# Patient Record
Sex: Female | Born: 1945
Health system: Southern US, Community
[De-identification: ages and names within clinical notes are randomized; demographics above are authoritative.]

## PROBLEM LIST (undated history)

## (undated) DIAGNOSIS — I10 Essential (primary) hypertension: Secondary | ICD-10-CM

## (undated) DIAGNOSIS — M1990 Unspecified osteoarthritis, unspecified site: Secondary | ICD-10-CM

## (undated) DIAGNOSIS — J309 Allergic rhinitis, unspecified: Secondary | ICD-10-CM

## (undated) DIAGNOSIS — K219 Gastro-esophageal reflux disease without esophagitis: Secondary | ICD-10-CM

## (undated) DIAGNOSIS — E785 Hyperlipidemia, unspecified: Secondary | ICD-10-CM

## (undated) DIAGNOSIS — T7840XA Allergy, unspecified, initial encounter: Secondary | ICD-10-CM

## (undated) HISTORY — PX: COLONOSCOPY: SHX174

## (undated) HISTORY — DX: Essential (primary) hypertension: I10

## (undated) HISTORY — DX: Allergic rhinitis, unspecified: J30.9

## (undated) HISTORY — PX: ABDOMINAL HYSTERECTOMY: SHX81

## (undated) HISTORY — DX: Hyperlipidemia, unspecified: E78.5

## (undated) HISTORY — DX: Allergy, unspecified, initial encounter: T78.40XA

## (undated) HISTORY — PX: CATARACT EXTRACTION W/ INTRAOCULAR LENS IMPLANT: SHX1309

## (undated) HISTORY — PX: FOOT SURGERY: SHX648

---

## 1988-08-03 HISTORY — PX: VAGINAL HYSTERECTOMY: SUR661

## 2006-01-15 DIAGNOSIS — E881 Lipodystrophy, not elsewhere classified: Secondary | ICD-10-CM | POA: Insufficient documentation

## 2006-02-11 ENCOUNTER — Ambulatory Visit: Payer: Self-pay

## 2006-06-11 DIAGNOSIS — M545 Low back pain, unspecified: Secondary | ICD-10-CM | POA: Insufficient documentation

## 2007-01-24 ENCOUNTER — Ambulatory Visit: Payer: Self-pay | Admitting: Family Medicine

## 2007-03-24 ENCOUNTER — Ambulatory Visit: Payer: Self-pay

## 2007-07-20 ENCOUNTER — Ambulatory Visit: Payer: Self-pay | Admitting: Specialist

## 2007-12-20 DIAGNOSIS — L989 Disorder of the skin and subcutaneous tissue, unspecified: Secondary | ICD-10-CM | POA: Insufficient documentation

## 2008-05-03 ENCOUNTER — Ambulatory Visit: Payer: Self-pay | Admitting: Unknown Physician Specialty

## 2008-10-03 ENCOUNTER — Ambulatory Visit: Payer: Self-pay | Admitting: Gastroenterology

## 2009-08-20 ENCOUNTER — Ambulatory Visit: Payer: Self-pay

## 2010-11-26 ENCOUNTER — Ambulatory Visit: Payer: Self-pay

## 2011-11-15 DIAGNOSIS — R51 Headache: Secondary | ICD-10-CM | POA: Diagnosis not present

## 2011-11-15 DIAGNOSIS — H9209 Otalgia, unspecified ear: Secondary | ICD-10-CM | POA: Diagnosis not present

## 2011-11-15 DIAGNOSIS — J3489 Other specified disorders of nose and nasal sinuses: Secondary | ICD-10-CM | POA: Diagnosis not present

## 2011-11-28 DIAGNOSIS — R109 Unspecified abdominal pain: Secondary | ICD-10-CM | POA: Diagnosis not present

## 2011-12-03 DIAGNOSIS — J069 Acute upper respiratory infection, unspecified: Secondary | ICD-10-CM | POA: Diagnosis not present

## 2011-12-03 DIAGNOSIS — E785 Hyperlipidemia, unspecified: Secondary | ICD-10-CM | POA: Diagnosis not present

## 2012-01-23 DIAGNOSIS — J029 Acute pharyngitis, unspecified: Secondary | ICD-10-CM | POA: Diagnosis not present

## 2012-01-23 DIAGNOSIS — J019 Acute sinusitis, unspecified: Secondary | ICD-10-CM | POA: Diagnosis not present

## 2012-01-23 DIAGNOSIS — R05 Cough: Secondary | ICD-10-CM | POA: Diagnosis not present

## 2012-01-23 DIAGNOSIS — R030 Elevated blood-pressure reading, without diagnosis of hypertension: Secondary | ICD-10-CM | POA: Diagnosis not present

## 2012-01-23 DIAGNOSIS — R059 Cough, unspecified: Secondary | ICD-10-CM | POA: Diagnosis not present

## 2012-02-29 DIAGNOSIS — I8390 Asymptomatic varicose veins of unspecified lower extremity: Secondary | ICD-10-CM | POA: Diagnosis not present

## 2012-02-29 DIAGNOSIS — E881 Lipodystrophy, not elsewhere classified: Secondary | ICD-10-CM | POA: Diagnosis not present

## 2012-02-29 DIAGNOSIS — I10 Essential (primary) hypertension: Secondary | ICD-10-CM | POA: Diagnosis not present

## 2012-02-29 DIAGNOSIS — E785 Hyperlipidemia, unspecified: Secondary | ICD-10-CM | POA: Diagnosis not present

## 2012-02-29 DIAGNOSIS — R490 Dysphonia: Secondary | ICD-10-CM | POA: Diagnosis not present

## 2012-03-23 DIAGNOSIS — Z01419 Encounter for gynecological examination (general) (routine) without abnormal findings: Secondary | ICD-10-CM | POA: Diagnosis not present

## 2012-03-23 DIAGNOSIS — Z1231 Encounter for screening mammogram for malignant neoplasm of breast: Secondary | ICD-10-CM | POA: Diagnosis not present

## 2012-03-23 DIAGNOSIS — Z124 Encounter for screening for malignant neoplasm of cervix: Secondary | ICD-10-CM | POA: Diagnosis not present

## 2012-03-23 DIAGNOSIS — R87810 Cervical high risk human papillomavirus (HPV) DNA test positive: Secondary | ICD-10-CM | POA: Diagnosis not present

## 2012-09-16 DIAGNOSIS — H6690 Otitis media, unspecified, unspecified ear: Secondary | ICD-10-CM | POA: Diagnosis not present

## 2012-09-16 DIAGNOSIS — H6980 Other specified disorders of Eustachian tube, unspecified ear: Secondary | ICD-10-CM | POA: Diagnosis not present

## 2013-06-19 DIAGNOSIS — R490 Dysphonia: Secondary | ICD-10-CM | POA: Diagnosis not present

## 2013-06-19 DIAGNOSIS — I10 Essential (primary) hypertension: Secondary | ICD-10-CM | POA: Diagnosis not present

## 2013-06-19 DIAGNOSIS — H65199 Other acute nonsuppurative otitis media, unspecified ear: Secondary | ICD-10-CM | POA: Diagnosis not present

## 2013-06-19 DIAGNOSIS — I8390 Asymptomatic varicose veins of unspecified lower extremity: Secondary | ICD-10-CM | POA: Diagnosis not present

## 2013-11-03 DIAGNOSIS — I10 Essential (primary) hypertension: Secondary | ICD-10-CM | POA: Diagnosis not present

## 2013-11-03 DIAGNOSIS — Z23 Encounter for immunization: Secondary | ICD-10-CM | POA: Diagnosis not present

## 2013-11-03 DIAGNOSIS — I8390 Asymptomatic varicose veins of unspecified lower extremity: Secondary | ICD-10-CM | POA: Diagnosis not present

## 2013-11-03 DIAGNOSIS — R490 Dysphonia: Secondary | ICD-10-CM | POA: Diagnosis not present

## 2013-11-03 DIAGNOSIS — E881 Lipodystrophy, not elsewhere classified: Secondary | ICD-10-CM | POA: Diagnosis not present

## 2013-11-03 DIAGNOSIS — E782 Mixed hyperlipidemia: Secondary | ICD-10-CM | POA: Diagnosis not present

## 2013-11-20 DIAGNOSIS — J019 Acute sinusitis, unspecified: Secondary | ICD-10-CM | POA: Diagnosis not present

## 2013-12-28 ENCOUNTER — Ambulatory Visit: Payer: Self-pay | Admitting: Family Medicine

## 2013-12-28 DIAGNOSIS — Z1231 Encounter for screening mammogram for malignant neoplasm of breast: Secondary | ICD-10-CM | POA: Diagnosis not present

## 2013-12-28 LAB — HM MAMMOGRAPHY

## 2014-07-30 DIAGNOSIS — H6690 Otitis media, unspecified, unspecified ear: Secondary | ICD-10-CM | POA: Diagnosis not present

## 2014-09-08 IMAGING — MG MM DIGITAL SCREENING BILAT W/ CAD
1 series · 4 of 4 positions shown · non-contrast
Comparison: Previous exam(s).

CLINICAL DATA: Screening.

EXAM:
DIGITAL SCREENING BILATERAL MAMMOGRAM WITH CAD

[R CC · right · 4 of 4 slices shown]
[im 1/4]
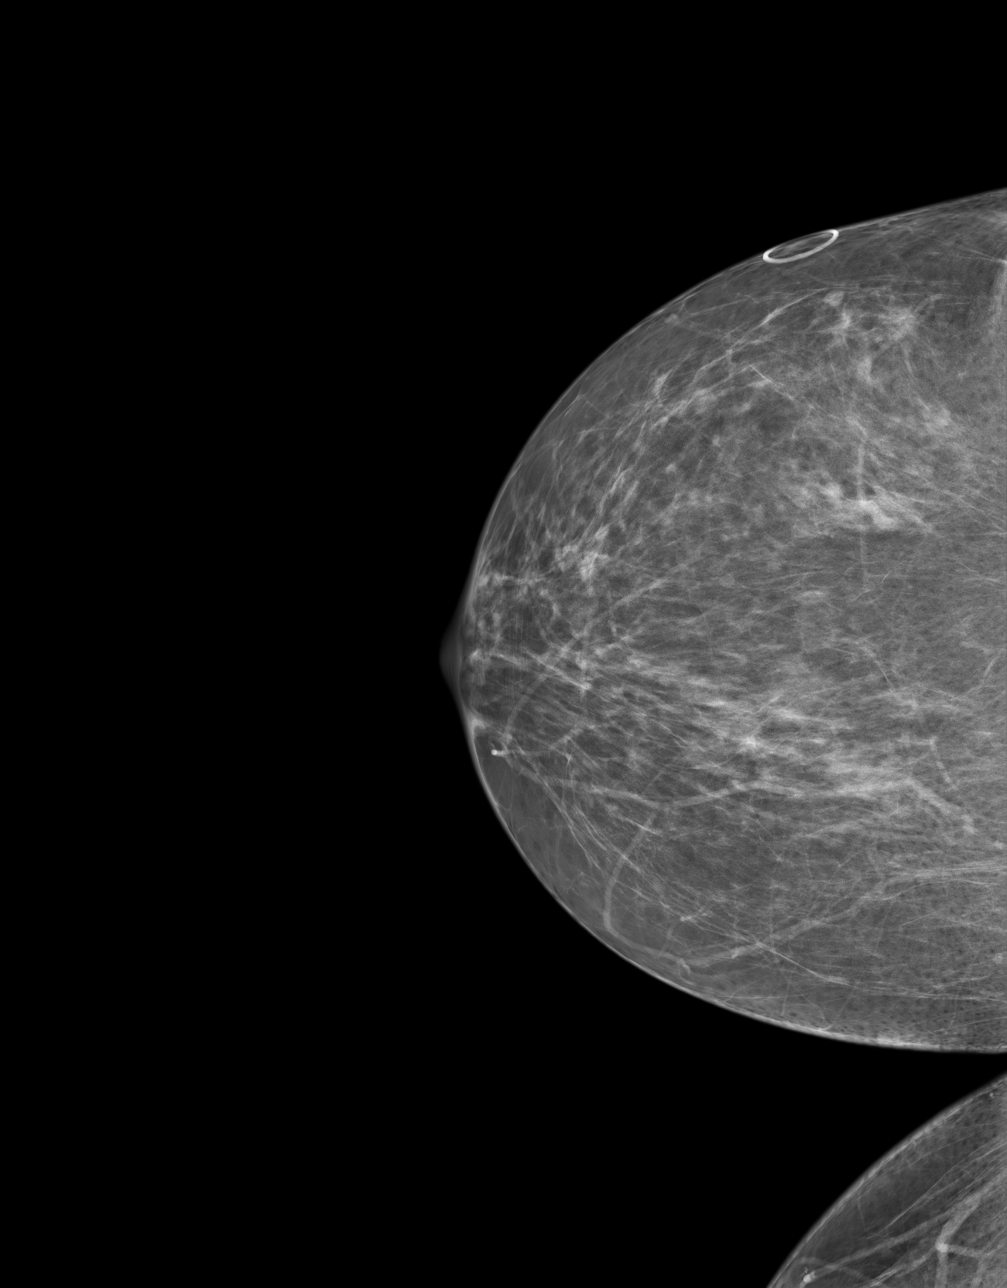
[im 2/4]
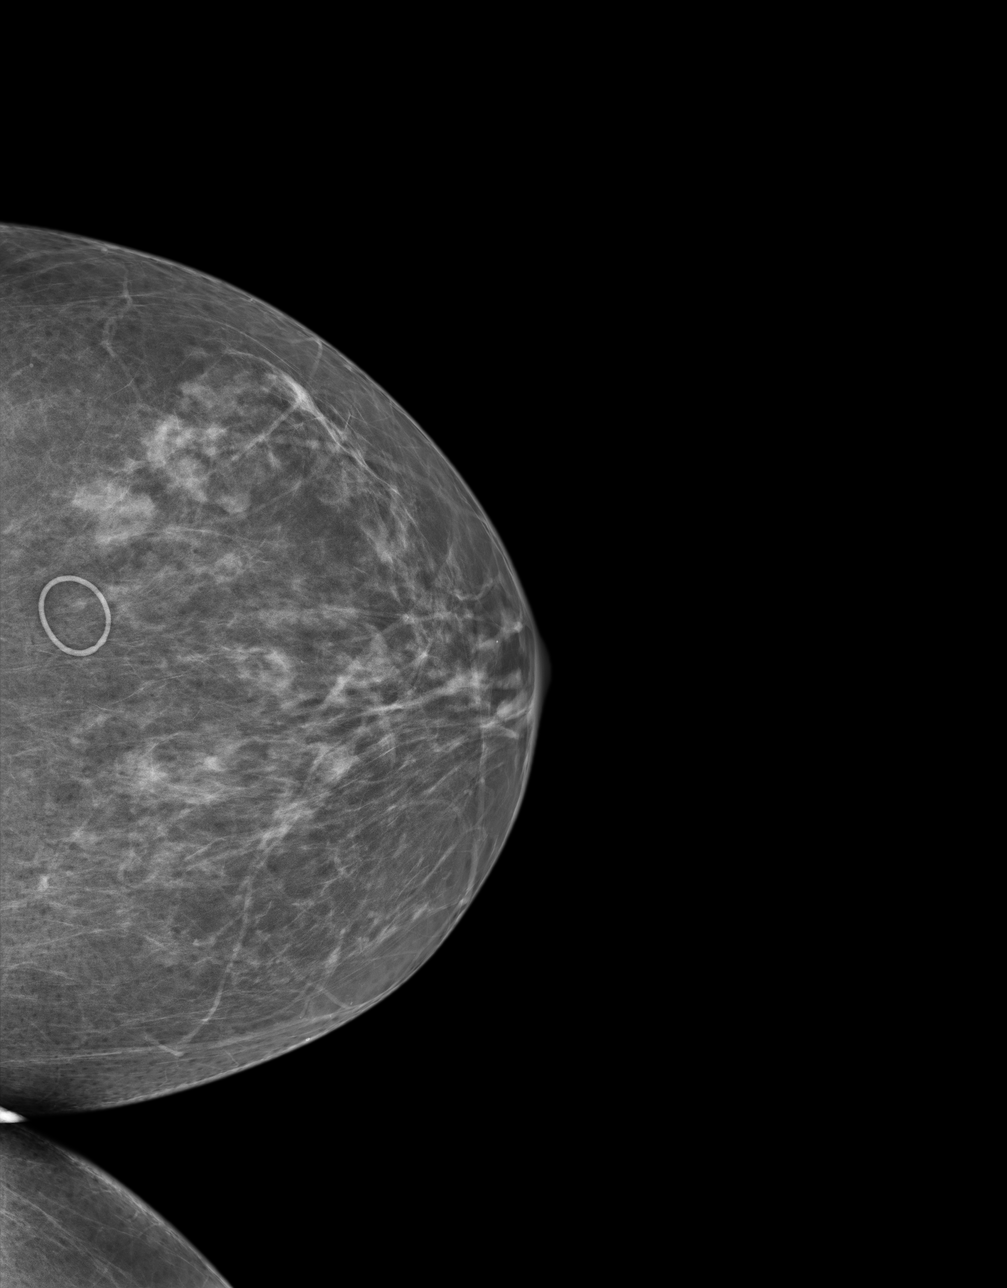
[im 3/4]
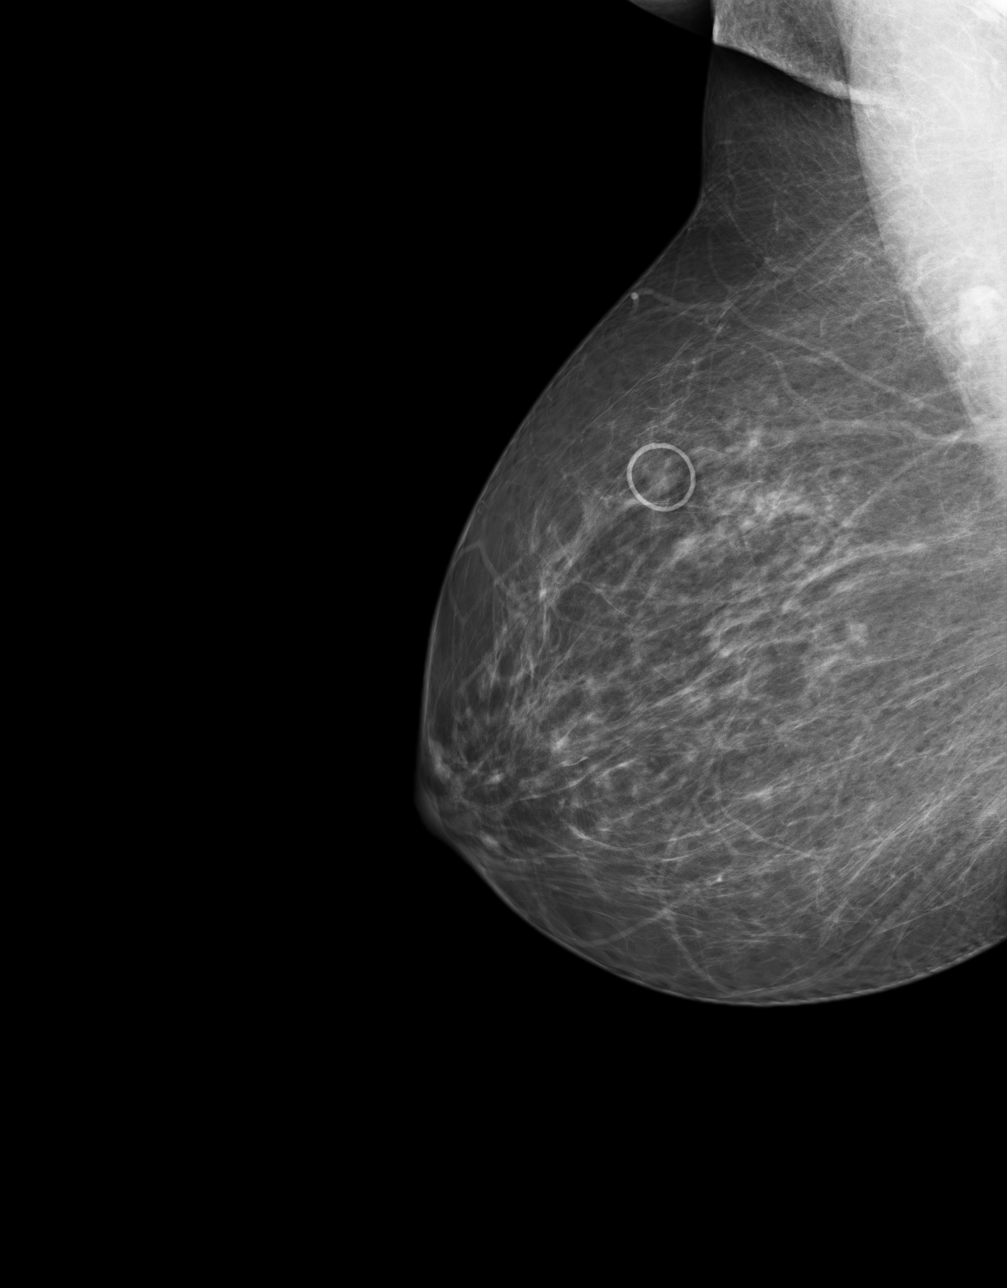
[im 4/4]
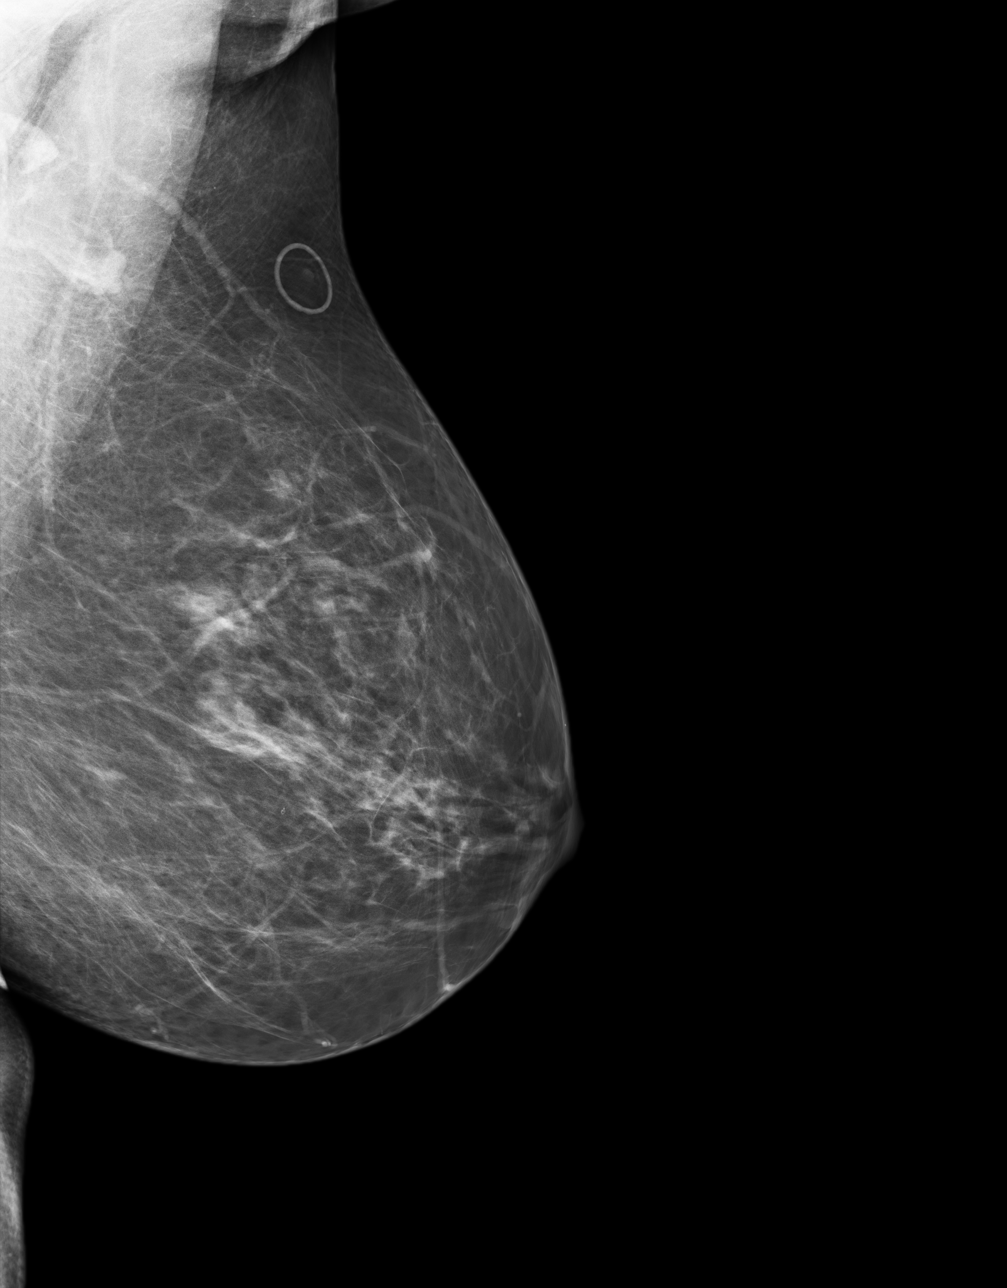

[4 of 4 positions shown; findings below may reference images not displayed]

ACR Breast Density Category b: There are scattered areas of
fibroglandular density.
FINDINGS: There are no findings suspicious for malignancy. Images were
processed with CAD.
IMPRESSION: No mammographic evidence of malignancy. A result letter of this
screening mammogram will be mailed directly to the patient.

RECOMMENDATION:
Screening mammogram in one year. (Code:AS-G-LCT)

BI-RADS CATEGORY  1: Negative.

## 2014-09-23 DIAGNOSIS — J189 Pneumonia, unspecified organism: Secondary | ICD-10-CM | POA: Diagnosis not present

## 2014-09-23 DIAGNOSIS — R05 Cough: Secondary | ICD-10-CM | POA: Diagnosis not present

## 2014-12-25 DIAGNOSIS — Z0000 Encounter for general adult medical examination without abnormal findings: Secondary | ICD-10-CM | POA: Diagnosis not present

## 2014-12-25 DIAGNOSIS — E881 Lipodystrophy, not elsewhere classified: Secondary | ICD-10-CM | POA: Diagnosis not present

## 2014-12-25 DIAGNOSIS — I10 Essential (primary) hypertension: Secondary | ICD-10-CM | POA: Diagnosis not present

## 2014-12-25 LAB — BASIC METABOLIC PANEL
BUN: 14 mg/dL (ref 4–21)
CREATININE: 0.8 mg/dL (ref 0.5–1.1)
Glucose: 91 mg/dL
Potassium: 4.4 mmol/L (ref 3.4–5.3)
Sodium: 143 mmol/L (ref 137–147)

## 2014-12-25 LAB — LIPID PANEL
Cholesterol: 142 mg/dL (ref 0–200)
HDL: 38 mg/dL (ref 35–70)
LDL Cholesterol: 77 mg/dL
Triglycerides: 136 mg/dL (ref 40–160)

## 2015-03-30 ENCOUNTER — Other Ambulatory Visit: Payer: Self-pay | Admitting: Family Medicine

## 2015-04-02 ENCOUNTER — Ambulatory Visit (INDEPENDENT_AMBULATORY_CARE_PROVIDER_SITE_OTHER): Payer: Medicare Other | Admitting: Family Medicine

## 2015-04-02 ENCOUNTER — Encounter: Payer: Self-pay | Admitting: Family Medicine

## 2015-04-02 VITALS — BP 132/82 | HR 67 | Temp 98.3°F | Resp 18

## 2015-04-02 DIAGNOSIS — R251 Tremor, unspecified: Secondary | ICD-10-CM | POA: Insufficient documentation

## 2015-04-02 DIAGNOSIS — I10 Essential (primary) hypertension: Secondary | ICD-10-CM | POA: Insufficient documentation

## 2015-04-02 DIAGNOSIS — J329 Chronic sinusitis, unspecified: Secondary | ICD-10-CM | POA: Diagnosis not present

## 2015-04-02 DIAGNOSIS — J309 Allergic rhinitis, unspecified: Secondary | ICD-10-CM | POA: Insufficient documentation

## 2015-04-02 DIAGNOSIS — K146 Glossodynia: Secondary | ICD-10-CM | POA: Insufficient documentation

## 2015-04-02 DIAGNOSIS — I8390 Asymptomatic varicose veins of unspecified lower extremity: Secondary | ICD-10-CM | POA: Insufficient documentation

## 2015-04-02 DIAGNOSIS — J069 Acute upper respiratory infection, unspecified: Secondary | ICD-10-CM | POA: Insufficient documentation

## 2015-04-02 MED ORDER — AMOXICILLIN 500 MG PO CAPS: 1000.0000 mg | ORAL_CAPSULE | Freq: Two times a day (BID) | ORAL | Status: AC

## 2015-04-02 NOTE — Progress Notes (Signed)
       Patient: Deanna Weaver Female    DOB: 1946/06/21   69 y.o.   MRN: [id number] Visit Date: 04/02/2015  Today's Provider: Lelon Huh, MD   Chief Complaint  Patient presents with  . Ear Pain    left ear x 2 days   Subjective:    HPI Patient presents to day with left ear pain. Patient states pain started 2 days ago and is worsening. Pain is described as a sharp shooting pain. Patient has been using Aleve and a prescription ear drop that was prescribed to her over 1 year ago for ear infection. Patient states the ear drops have helped decrease the severity of pain.  Patient states she has been having hoarseness, sinus congestion and pain,  for the past 2 weeks.  Patient denies any fevers, chills, sweats, headaches, lightheadedness or dizziness.     Allergies  Allergen Reactions  . Celecoxib   . Metoprolol     Depression, palpitations.   Previous Medications   FLUTICASONE (FLONASE) 50 MCG/ACT NASAL SPRAY    Place into the nose. 2(two) nasal each nostril once a day   LISINOPRIL (PRINIVIL,ZESTRIL) 5 MG TABLET    TAKE 1 TABLET BY MOUTH EVERY DAY   OMEGA-3 FATTY ACIDS (FISH OIL) 1000 MG CAPS    Take 4 capsules by mouth daily.   SIMVASTATIN (ZOCOR) 40 MG TABLET    TAKE 1 TABLET BY MOUTH AT BEDTIME    Review of Systems  Constitutional: Negative for fever, chills, appetite change and fatigue.  HENT: Positive for congestion (sinus), ear discharge, ear pain (left ear), rhinorrhea, sinus pressure, sneezing, sore throat and voice change (hoarsness). Negative for nosebleeds, postnasal drip, tinnitus and trouble swallowing.   Eyes: Negative for photophobia, pain, discharge, redness, itching and visual disturbance.  Respiratory: Negative for cough, chest tightness and shortness of breath.   Cardiovascular: Negative for chest pain and palpitations.  Gastrointestinal: Negative for nausea, vomiting and abdominal pain.  Neurological: Negative for dizziness and weakness.    Social History    Substance Use Topics  . Smoking status: Never Smoker   . Smokeless tobacco: Not on file  . Alcohol Use: No   Objective:   BP 132/82 mmHg  Pulse 67  Temp(Src) 98.3 F (36.8 C) (Oral)  Resp 18  SpO2 97%  Physical Exam  General Appearance:    Alert, cooperative, no distress  HENT:   left TM fluid noted, neck without nodes, throat normal without erythema or exudate, frontal sinus tender and post nasal drip noted  Eyes:    PERRL, conjunctiva/corneas clear, EOM's intact       Lungs:     Clear to auscultation bilaterally, respirations unlabored  Heart:    Regular rate and rhythm  Neurologic:   Awake, alert, oriented x 3. No apparent focal neurological           defect.           Assessment & Plan:     1. Sinusitis, unspecified chronicity, unspecified location May take two OTC every 4 hours for ear pain. Call if not much better in 1-2 days.  - amoxicillin (AMOXIL) 500 MG capsule; Take 2 capsules (1,000 mg total) by mouth 2 (two) times daily.  Dispense: 40 capsule; Refill: 0        Lelon Huh, MD  [facility]

## 2015-04-16 ENCOUNTER — Encounter: Payer: Self-pay | Admitting: Family Medicine

## 2015-06-14 ENCOUNTER — Ambulatory Visit (INDEPENDENT_AMBULATORY_CARE_PROVIDER_SITE_OTHER): Payer: Medicare Other | Admitting: Family Medicine

## 2015-06-14 ENCOUNTER — Encounter: Payer: Self-pay | Admitting: Family Medicine

## 2015-06-14 VITALS — BP 130/80 | HR 72 | Temp 98.1°F | Resp 16 | Wt 150.0 lb

## 2015-06-14 DIAGNOSIS — H6692 Otitis media, unspecified, left ear: Secondary | ICD-10-CM

## 2015-06-14 DIAGNOSIS — G250 Essential tremor: Secondary | ICD-10-CM

## 2015-06-14 MED ORDER — PRIMIDONE 50 MG PO TABS: ORAL_TABLET | ORAL | Status: DC

## 2015-06-14 MED ORDER — AMOXICILLIN 500 MG PO CAPS: 1000.0000 mg | ORAL_CAPSULE | Freq: Two times a day (BID) | ORAL | Status: AC

## 2015-06-14 NOTE — Progress Notes (Signed)
Patient: Deanna Weaver Female    DOB: 07/13/1946   69 y.o.   MRN: [id number] Visit Date: 06/14/2015  Today's Provider: Lelon Huh, MD   Chief Complaint  Patient presents with  . Ear Pain    x 2 days   Subjective:    HPI  Ear Pain: Patient has had pain in her left ear for 2 days. Patient describes the pain as a sharp sensation inside her ear.  Patient has tried using ear drops and Tylenol to help with pain.  Patient also has tenderness in the glands below her left ear.   Tremors: Has had mild but worsening tremors in both hands for several yearly. Tremors only occur when using hands. No resting tremors. No gait or balance problems. Tried metoprolol in 2011 which made her extremely fatigued. She would like to try something different. She had normal CBC, met C, and TSH at that time.     Allergies  Allergen Reactions  . Celecoxib   . Metoprolol     Depression, palpitations.   Previous Medications   FLUTICASONE (FLONASE) 50 MCG/ACT NASAL SPRAY    Place into the nose. 2(two) nasal each nostril once a day   LISINOPRIL (PRINIVIL,ZESTRIL) 5 MG TABLET    TAKE 1 TABLET BY MOUTH EVERY DAY   OMEGA-3 FATTY ACIDS (FISH OIL) 1000 MG CAPS    Take 4 capsules by mouth daily.   SIMVASTATIN (ZOCOR) 40 MG TABLET    TAKE 1 TABLET BY MOUTH AT BEDTIME    Review of Systems  Constitutional: Negative for fever, chills, diaphoresis, appetite change and fatigue.  HENT: Positive for congestion (head congestion), ear pain, sinus pressure, sneezing, sore throat (now resolved) and voice change. Negative for ear discharge, nosebleeds, postnasal drip, rhinorrhea, tinnitus and trouble swallowing.   Eyes: Positive for itching. Negative for photophobia, pain, discharge and redness.  Respiratory: Negative for cough, chest tightness and shortness of breath.   Cardiovascular: Negative for chest pain and palpitations.  Gastrointestinal: Negative for nausea, vomiting and abdominal pain.  Neurological:  Negative for dizziness and weakness.    Social History  Substance Use Topics  . Smoking status: Never Smoker   . Smokeless tobacco: Not on file  . Alcohol Use: No   Objective:   BP 130/80 mmHg  Pulse 72  Temp(Src) 98.1 F (36.7 C) (Oral)  Resp 16  Wt 150 lb (68.04 kg)  SpO2 92%  Physical Exam  General Appearance:    Alert, cooperative, no distress  HENT:   left TM red, dull, bulging, neck has left anterior cervical nodes enlarged, sinuses nontender and nasal mucosa congested  Eyes:    PERRL, conjunctiva/corneas clear, EOM's intact       Lungs:     Clear to auscultation bilaterally, respirations unlabored  Heart:    Regular rate and rhythm  Neurologic:   Awake, alert, oriented x 3. No apparent focal neurological           defect. Mild shaking with intention both hands, left >Right. No cogwheeling. Normal gait.           Assessment & Plan:     1. Acute left otitis media, recurrence not specified, unspecified otitis media type  - amoxicillin (AMOXIL) 500 MG capsule; Take 2 capsules (1,000 mg total) by mouth 2 (two) times daily.  Dispense: 40 capsule; Refill: 0  2. Essential tremor Did not tolerate metoprolol in the past.  - primidone (MYSOLINE) 50 MG tablet; Start 1/2  tablet at bed time for 3 days, then increase to 1 tablet at bedtime for 3 days, then increase to 2 tablets if needed for tremors.  Dispense: 60 tablet; Refill: 1       Lelon Huh, MD  [facility]

## 2015-08-01 DIAGNOSIS — L570 Actinic keratosis: Secondary | ICD-10-CM | POA: Diagnosis not present

## 2015-08-01 DIAGNOSIS — L821 Other seborrheic keratosis: Secondary | ICD-10-CM | POA: Diagnosis not present

## 2015-08-01 DIAGNOSIS — X32XXXA Exposure to sunlight, initial encounter: Secondary | ICD-10-CM | POA: Diagnosis not present

## 2015-08-01 DIAGNOSIS — D225 Melanocytic nevi of trunk: Secondary | ICD-10-CM | POA: Diagnosis not present

## 2015-09-09 ENCOUNTER — Ambulatory Visit (INDEPENDENT_AMBULATORY_CARE_PROVIDER_SITE_OTHER): Payer: Medicare Other | Admitting: Family Medicine

## 2015-09-09 ENCOUNTER — Encounter: Payer: Self-pay | Admitting: Family Medicine

## 2015-09-09 VITALS — BP 110/64 | HR 68 | Temp 98.7°F | Resp 16 | Wt 151.0 lb

## 2015-09-09 DIAGNOSIS — J329 Chronic sinusitis, unspecified: Secondary | ICD-10-CM | POA: Diagnosis not present

## 2015-09-09 MED ORDER — AMOXICILLIN 500 MG PO CAPS: 1000.0000 mg | ORAL_CAPSULE | Freq: Two times a day (BID) | ORAL | Status: AC

## 2015-09-09 NOTE — Progress Notes (Signed)
Patient: Deanna Weaver Female    DOB: 04-17-1946   70 y.o.   MRN: [id number] Visit Date: 09/09/2015  Today's Provider: Lelon Huh, MD   Chief Complaint  Patient presents with  . Sinusitis   Subjective:    Sinusitis This is a new problem. The current episode started 1 to 4 weeks ago (2 weeks). The problem has been gradually worsening since onset. There has been no fever. Associated symptoms include congestion, coughing, headaches, a hoarse voice, sinus pressure, sneezing, a sore throat and swollen glands. Pertinent negatives include no chills, diaphoresis, ear pain, neck pain or shortness of breath. Treatments tried: claritin. The treatment provided mild relief.    Sinus pressure with headache for 2 weeks. Face pain, sore throat, productive cough, and sinus congestion. No fever. Is using Fluticasone and nasal saline   Allergies  Allergen Reactions  . Celecoxib   . Metoprolol     Depression, palpitations.   Previous Medications   FLUTICASONE (FLONASE) 50 MCG/ACT NASAL SPRAY    Place into the nose. 2(two) nasal each nostril once a day   LISINOPRIL (PRINIVIL,ZESTRIL) 5 MG TABLET    TAKE 1 TABLET BY MOUTH EVERY DAY   OMEGA-3 FATTY ACIDS (FISH OIL) 1000 MG CAPS    Take 4 capsules by mouth daily.   PRIMIDONE (MYSOLINE) 50 MG TABLET    Start 1/2 tablet at bed time for 3 days, then increase to 1 tablet at bedtime for 3 days, then increase to 2 tablets if needed for tremors.   SIMVASTATIN (ZOCOR) 40 MG TABLET    TAKE 1 TABLET BY MOUTH AT BEDTIME    Review of Systems  Constitutional: Positive for fatigue. Negative for fever, chills, diaphoresis and appetite change.  HENT: Positive for congestion, hoarse voice, sinus pressure, sneezing and sore throat. Negative for ear pain.   Respiratory: Positive for cough. Negative for chest tightness and shortness of breath.   Cardiovascular: Negative for chest pain and palpitations.  Gastrointestinal: Negative for nausea, vomiting and  abdominal pain.  Musculoskeletal: Negative for neck pain.  Neurological: Positive for headaches. Negative for dizziness and weakness.    Social History  Substance Use Topics  . Smoking status: Never Smoker   . Smokeless tobacco: Not on file  . Alcohol Use: No   Objective:   BP 110/64 mmHg  Pulse 68  Temp(Src) 98.7 F (37.1 C) (Oral)  Resp 16  Wt 151 lb (68.493 kg)  SpO2 98%  Physical Exam  General Appearance:    Alert, cooperative, no distress  HENT:   bilateral TM normal without fluid or infection, neck without nodes, Maxillary and frontal sinus tender and nasal mucosa pale and congested  Eyes:    PERRL, conjunctiva/corneas clear, EOM's intact       Lungs:     Clear to auscultation bilaterally, respirations unlabored  Heart:    Regular rate and rhythm  Neurologic:   Awake, alert, oriented x 3. No apparent focal neurological           defect.           Assessment & Plan:     1. Sinusitis, unspecified chronicity, unspecified location  - amoxicillin (AMOXIL) 500 MG capsule; Take 2 capsules (1,000 mg total) by mouth 2 (two) times daily.  Dispense: 40 capsule; Refill: 0  Call if symptoms change or if not rapidly improving.          Lelon Huh, MD  [facility]

## 2015-11-19 ENCOUNTER — Other Ambulatory Visit: Payer: Self-pay | Admitting: Family Medicine

## 2015-11-19 DIAGNOSIS — Z1231 Encounter for screening mammogram for malignant neoplasm of breast: Secondary | ICD-10-CM

## 2015-12-02 ENCOUNTER — Ambulatory Visit
Admission: RE | Admit: 2015-12-02 | Discharge: 2015-12-02 | Disposition: A | Discharge status: Home / Self Care | Payer: Medicare Other | Source: Ambulatory Visit | Attending: Family Medicine | Admitting: Family Medicine

## 2015-12-02 ENCOUNTER — Other Ambulatory Visit: Payer: Self-pay | Admitting: Family Medicine

## 2015-12-02 DIAGNOSIS — Z1231 Encounter for screening mammogram for malignant neoplasm of breast: Secondary | ICD-10-CM | POA: Diagnosis not present

## 2016-02-01 DIAGNOSIS — B349 Viral infection, unspecified: Secondary | ICD-10-CM | POA: Diagnosis not present

## 2016-06-02 ENCOUNTER — Telehealth: Payer: Self-pay | Admitting: Family Medicine

## 2016-06-02 NOTE — Telephone Encounter (Signed)
Called Pt to schedule AWV with [facility] - knb °

## 2016-06-27 ENCOUNTER — Other Ambulatory Visit: Payer: Self-pay | Admitting: Family Medicine

## 2016-07-10 ENCOUNTER — Encounter: Payer: Self-pay | Admitting: Family Medicine

## 2016-07-10 ENCOUNTER — Ambulatory Visit (INDEPENDENT_AMBULATORY_CARE_PROVIDER_SITE_OTHER): Payer: Medicare Other | Admitting: Family Medicine

## 2016-07-10 VITALS — BP 122/78 | HR 68 | Temp 98.2°F | Resp 16 | Wt 149.0 lb

## 2016-07-10 DIAGNOSIS — R251 Tremor, unspecified: Secondary | ICD-10-CM | POA: Diagnosis not present

## 2016-07-10 DIAGNOSIS — E881 Lipodystrophy, not elsewhere classified: Secondary | ICD-10-CM

## 2016-07-10 DIAGNOSIS — B354 Tinea corporis: Secondary | ICD-10-CM

## 2016-07-10 DIAGNOSIS — I10 Essential (primary) hypertension: Secondary | ICD-10-CM

## 2016-07-10 DIAGNOSIS — H9201 Otalgia, right ear: Secondary | ICD-10-CM | POA: Diagnosis not present

## 2016-07-10 MED ORDER — CLOTRIMAZOLE-BETAMETHASONE 1-0.05 % EX CREA: 1.0000 "application " | TOPICAL_CREAM | Freq: Two times a day (BID) | CUTANEOUS | 0 refills | Status: DC

## 2016-07-10 NOTE — Progress Notes (Signed)
Patient: Deanna Weaver Female    DOB: [birth year]   70 y.o.   MRN: [id number] Visit Date: 07/10/2016  Today's Provider: Lelon Huh, MD   Chief Complaint  Patient presents with  . Hypertension    follow up  . Hyperlipidemia    follow up  . Tremors    follow up   Subjective:    HPI  Hypertension, follow-up:  BP Readings from Last 3 Encounters:  09/09/15 110/64  06/14/15 130/80  04/02/15 132/82    She was last seen for hypertension 1 years ago.  BP at that visit was 118/78. Management since that visit includes no changes. She reports good compliance with treatment. She is not having side effects.  She is exercising. She is adherent to low salt diet.   Outside blood pressures are checked at the pharmacy occasionally. She is experiencing none.  Patient denies chest pain, chest pressure/discomfort, claudication, dyspnea, exertional chest pressure/discomfort, fatigue, irregular heart beat, lower extremity edema, near-syncope, orthopnea, palpitations, paroxysmal nocturnal dyspnea, syncope and tachypnea.   Cardiovascular risk factors include advanced age (older than 51 for men, 38 for women), dyslipidemia and hypertension.  Use of agents associated with hypertension: none.     Weight trend: stable Wt Readings from Last 3 Encounters:  09/09/15 151 lb (68.5 kg)  06/14/15 150 lb (68 kg)    Current diet: well balanced  ------------------------------------------------------------------------  Lipid/Cholesterol, Follow-up:   Last seen for this1 years ago.  Management changes since that visit include none. . Last Lipid Panel:    Component Value Date/Time   CHOL 142 12/25/2014   TRIG 136 12/25/2014   HDL 38 12/25/2014   LDLCALC 77 12/25/2014    Risk factors for vascular disease include hypercholesterolemia and hypertension  She reports good compliance with treatment. She is not having side effects.  Current symptoms include none and have been  stable. Weight trend: stable Prior visit with dietician: no Current diet: well balanced Current exercise: walking  Wt Readings from Last 3 Encounters:  09/09/15 151 lb (68.5 kg)  06/14/15 150 lb (68 kg)    ------------------------------------------------------------------- Follow up Tremors: Patient was last seen for this problem 1 year ago and was started on Primidone. Patient comes in today reporting that she stopped taking Primidone several months ago due to it causing dizziness. Patient reports Tremors are unchanged since the last office visit.  Rash: Patient comes in today reporting that she has had a red itchy rash on her left side for the past week.  She tried some old prescriptions cream which has been helping, but she doesn't know what it is.   Allergies  Allergen Reactions  . Celecoxib   . Metoprolol     Depression, palpitations.     Current Outpatient Prescriptions:  .  fluticasone (FLONASE) 50 MCG/ACT nasal spray, Place into the nose. 2(two) nasal each nostril once a day, Disp: , Rfl:  .  lisinopril (PRINIVIL,ZESTRIL) 5 MG tablet, TAKE 1 TABLET BY MOUTH EVERY DAY, Disp: 90 tablet, Rfl: 1 .  Omega-3 Fatty Acids (FISH OIL) 1000 MG CAPS, Take 4 capsules by mouth daily., Disp: , Rfl:  .  simvastatin (ZOCOR) 40 MG tablet, TAKE 1 TABLET BY MOUTH AT BEDTIME, Disp: 90 tablet, Rfl: 1 .  primidone (MYSOLINE) 50 MG tablet, Start 1/2 tablet at bed time for 3 days, then increase to 1 tablet at bedtime for 3 days, then increase to 2 tablets if needed for tremors. (Patient not taking: Reported on  07/10/2016), Disp: 60 tablet, Rfl: 1  Review of Systems  Constitutional: Negative for appetite change, chills, fatigue and fever.  Respiratory: Negative for chest tightness and shortness of breath.   Cardiovascular: Negative for chest pain and palpitations.  Gastrointestinal: Negative for abdominal pain, nausea and vomiting.  Skin: Positive for rash (itchy red rash on left side ).   Neurological: Positive for tremors. Negative for dizziness and weakness.    Social History  Substance Use Topics  . Smoking status: Never Smoker  . Smokeless tobacco: Never Used  . Alcohol use No   Objective:   BP 122/78 (BP Location: Left Arm, Patient Position: Sitting, Cuff Size: Normal)   Pulse 68   Temp 98.2 F (36.8 C) (Oral)   Resp 16   Wt 149 lb (67.6 kg)   SpO2 95% Comment: room air  Physical Exam   General Appearance:    Alert, cooperative, no distress  Eyes:    PERRL, conjunctiva/corneas clear, EOM's intact       Lungs:     Clear to auscultation bilaterally, respirations unlabored  Heart:    Regular rate and rhythm  Neurologic:   Awake, alert, oriented x 3. No apparent focal neurological           defect.   Derm:  Mildly inflamed patch of small erythematous papules left lateral hip.       Assessment & Plan:     1. Essential hypertension Well controlled.  Continue current medications.   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   - Lipid panel - Hepatic function panel  3. Tremor   4. Tinea corporis  - clotrimazole-betamethasone (LOTRISONE) cream; Apply 1 application topically 2 (two) times daily.  Dispense: 30 g; Refill: 0  5. Right ear pain She states her ear feels full, Exam shows ear canal which is clear and slightly dull TM. She has had some sinus congestion and recommended she start using her fluticasone nasal spray consistently.        Lelon Huh, MD  [facility]

## 2016-07-11 LAB — HEPATIC FUNCTION PANEL
ALK PHOS: 67 IU/L (ref 39–117)
ALT: 20 IU/L (ref 0–32)
AST: 24 IU/L (ref 0–40)
BILIRUBIN, DIRECT: 0.11 mg/dL (ref 0.00–0.40)
Bilirubin Total: 0.4 mg/dL (ref 0.0–1.2)
Total Protein: 7.5 g/dL (ref 6.0–8.5)

## 2016-07-11 LAB — RENAL FUNCTION PANEL
ALBUMIN: 4.6 g/dL (ref 3.5–4.8)
BUN/Creatinine Ratio: 14 (ref 12–28)
BUN: 13 mg/dL (ref 8–27)
CO2: 29 mmol/L (ref 18–29)
Calcium: 10 mg/dL (ref 8.7–10.3)
Chloride: 99 mmol/L (ref 96–106)
Creatinine, Ser: 0.91 mg/dL (ref 0.57–1.00)
GFR, EST AFRICAN AMERICAN: 74 mL/min/{1.73_m2} (ref >59)
GFR, EST NON AFRICAN AMERICAN: 64 mL/min/{1.73_m2} (ref >59)
Glucose: 89 mg/dL (ref 65–99)
Phosphorus: 3.1 mg/dL (ref 2.5–4.5)
Potassium: 4.9 mmol/L (ref 3.5–5.2)
Sodium: 143 mmol/L (ref 134–144)

## 2016-07-11 LAB — LIPID PANEL
CHOLESTEROL TOTAL: 130 mg/dL (ref 100–199)
Chol/HDL Ratio: 4.6 ratio units — ABNORMAL HIGH (ref 0.0–4.4)
HDL: 28 mg/dL — AB (ref >39)
LDL Calculated: 67 mg/dL (ref 0–99)
Triglycerides: 173 mg/dL — ABNORMAL HIGH (ref 0–149)
VLDL CHOLESTEROL CAL: 35 mg/dL (ref 5–40)

## 2016-07-13 ENCOUNTER — Telehealth: Payer: Self-pay | Admitting: Family Medicine

## 2016-07-13 MED ORDER — AMOXICILLIN 500 MG PO CAPS: 1000.0000 mg | ORAL_CAPSULE | Freq: Three times a day (TID) | ORAL | 0 refills | Status: AC

## 2016-07-13 NOTE — Telephone Encounter (Signed)
Pt states she was seen on Friday for her CPE but Dr Caryn Section did look at her ear.   Pt states she is now having ear pain.  Pt is requesting a Rx for this.  [facility].  [id number]

## 2016-07-13 NOTE — Telephone Encounter (Signed)
Please advise 

## 2016-07-13 NOTE — Telephone Encounter (Signed)
Patient was notified.

## 2016-07-13 NOTE — Telephone Encounter (Signed)
rx for amoxicillin has been sent to [facility]

## 2016-07-17 ENCOUNTER — Telehealth: Payer: Self-pay | Admitting: Family Medicine

## 2016-07-17 NOTE — Telephone Encounter (Signed)
Pt returned call and stated since she was just here last week she would rather call back in the coming months to schedule AWV. Thanks TNP

## 2016-07-17 NOTE — Telephone Encounter (Signed)
Called Pt to schedule AWV with [facility] - knb °

## 2016-08-04 DIAGNOSIS — L570 Actinic keratosis: Secondary | ICD-10-CM | POA: Diagnosis not present

## 2016-08-04 DIAGNOSIS — L821 Other seborrheic keratosis: Secondary | ICD-10-CM | POA: Diagnosis not present

## 2016-08-04 DIAGNOSIS — L300 Nummular dermatitis: Secondary | ICD-10-CM | POA: Diagnosis not present

## 2016-08-04 DIAGNOSIS — X32XXXA Exposure to sunlight, initial encounter: Secondary | ICD-10-CM | POA: Diagnosis not present

## 2016-10-23 ENCOUNTER — Ambulatory Visit (INDEPENDENT_AMBULATORY_CARE_PROVIDER_SITE_OTHER): Payer: Medicare Other

## 2016-10-23 ENCOUNTER — Ambulatory Visit (INDEPENDENT_AMBULATORY_CARE_PROVIDER_SITE_OTHER): Payer: Medicare Other | Admitting: Family Medicine

## 2016-10-23 VITALS — BP 140/82 | HR 72 | Temp 99.1°F | Ht 61.0 in | Wt 150.8 lb

## 2016-10-23 DIAGNOSIS — Z0000 Encounter for general adult medical examination without abnormal findings: Secondary | ICD-10-CM | POA: Diagnosis not present

## 2016-10-23 DIAGNOSIS — J040 Acute laryngitis: Secondary | ICD-10-CM | POA: Diagnosis not present

## 2016-10-23 DIAGNOSIS — I10 Essential (primary) hypertension: Secondary | ICD-10-CM | POA: Diagnosis not present

## 2016-10-23 DIAGNOSIS — J069 Acute upper respiratory infection, unspecified: Secondary | ICD-10-CM

## 2016-10-23 DIAGNOSIS — Z23 Encounter for immunization: Secondary | ICD-10-CM | POA: Diagnosis not present

## 2016-10-23 DIAGNOSIS — Z1159 Encounter for screening for other viral diseases: Secondary | ICD-10-CM | POA: Diagnosis not present

## 2016-10-23 MED ORDER — AZITHROMYCIN 250 MG PO TABS: ORAL_TABLET | ORAL | 0 refills | Status: AC

## 2016-10-23 NOTE — Patient Instructions (Signed)
Deanna Weaver , Thank you for taking time to come for your Medicare Wellness Visit. I appreciate your ongoing commitment to your health goals. Please review the following plan we discussed and let me know if I can assist you in the future.   Screening recommendations/referrals: Colonoscopy: last done 10/03/08 Mammogram: last done 12/03/15 Bone Density: declined Recommended yearly ophthalmology/optometry visit for glaucoma screening and checkup Recommended yearly dental visit for hygiene and checkup  Vaccinations: Influenza vaccine: declined Pneumococcal vaccine: declined Pneumovax 23 today Tdap vaccine: declined Shingles vaccine: declined   Advanced directives: declined  Next appointment: None   Preventive Care 71 Years and Older, Female Preventive care refers to lifestyle choices and visits with your health care provider that can promote health and wellness. What does preventive care include?  A yearly physical exam. This is also called an annual well check.  Dental exams once or twice a year.  Routine eye exams. Ask your health care provider how often you should have your eyes checked.  Personal lifestyle choices, including:  Daily care of your teeth and gums.  Regular physical activity.  Eating a healthy diet.  Avoiding tobacco and drug use.  Limiting alcohol use.  Practicing safe sex.  Taking low-dose aspirin every day.  Taking vitamin and mineral supplements as recommended by your health care provider. What happens during an annual well check? The services and screenings done by your health care provider during your annual well check will depend on your age, overall health, lifestyle risk factors, and family history of disease. Counseling  Your health care provider may ask you questions about your:  Alcohol use.  Tobacco use.  Drug use.  Emotional well-being.  Home and relationship well-being.  Sexual activity.  Eating habits.  History of  falls.  Memory and ability to understand (cognition).  Work and work Statistician.  Reproductive health. Screening  You may have the following tests or measurements:  Height, weight, and BMI.  Blood pressure.  Lipid and cholesterol levels. These may be checked every 5 years, or more frequently if you are over 39 years old.  Skin check.  Lung cancer screening. You may have this screening every year starting at age 71 if you have a 30-pack-year history of smoking and currently smoke or have quit within the past 15 years.  Fecal occult blood test (FOBT) of the stool. You may have this test every year starting at age 71.  Flexible sigmoidoscopy or colonoscopy. You may have a sigmoidoscopy every 5 years or a colonoscopy every 10 years starting at age 71.  Hepatitis C blood test.  Hepatitis B blood test.  Sexually transmitted disease (STD) testing.  Diabetes screening. This is done by checking your blood sugar (glucose) after you have not eaten for a while (fasting). You may have this done every 1-3 years.  Bone density scan. This is done to screen for osteoporosis. You may have this done starting at age 71.  Mammogram. This may be done every 1-2 years. Talk to your health care provider about how often you should have regular mammograms. Talk with your health care provider about your test results, treatment options, and if necessary, the need for more tests. Vaccines  Your health care provider may recommend certain vaccines, such as:  Influenza vaccine. This is recommended every year.  Tetanus, diphtheria, and acellular pertussis (Tdap, Td) vaccine. You may need a Td booster every 10 years.  Zoster vaccine. You may need this after age 68.  Pneumococcal 13-valent conjugate (PCV13)  vaccine. One dose is recommended after age 71.  Pneumococcal polysaccharide (PPSV23) vaccine. One dose is recommended after age 63. Talk to your health care provider about which screenings and  vaccines you need and how often you need them. This information is not intended to replace advice given to you by your health care provider. Make sure you discuss any questions you have with your health care provider. Document Released: 08/16/2015 Document Revised: 04/08/2016 Document Reviewed: 05/21/2015 Elsevier Interactive Patient Education  2017 [place] Prevention in the Home Falls can cause injuries. They can happen to people of all ages. There are many things you can do to make your home safe and to help prevent falls. What can I do on the outside of my home?  Regularly fix the edges of walkways and driveways and fix any cracks.  Remove anything that might make you trip as you walk through a door, such as a raised step or threshold.  Trim any bushes or trees on the path to your home.  Use bright outdoor lighting.  Clear any walking paths of anything that might make someone trip, such as rocks or tools.  Regularly check to see if handrails are loose or broken. Make sure that both sides of any steps have handrails.  Any raised decks and porches should have guardrails on the edges.  Have any leaves, snow, or ice cleared regularly.  Use sand or salt on walking paths during winter.  Clean up any spills in your garage right away. This includes oil or grease spills. What can I do in the bathroom?  Use night lights.  Install grab bars by the toilet and in the tub and shower. Do not use towel bars as grab bars.  Use non-skid mats or decals in the tub or shower.  If you need to sit down in the shower, use a plastic, non-slip stool.  Keep the floor dry. Clean up any water that spills on the floor as soon as it happens.  Remove soap buildup in the tub or shower regularly.  Attach bath mats securely with double-sided non-slip rug tape.  Do not have throw rugs and other things on the floor that can make you trip. What can I do in the bedroom?  Use night  lights.  Make sure that you have a light by your bed that is easy to reach.  Do not use any sheets or blankets that are too big for your bed. They should not hang down onto the floor.  Have a firm chair that has side arms. You can use this for support while you get dressed.  Do not have throw rugs and other things on the floor that can make you trip. What can I do in the kitchen?  Clean up any spills right away.  Avoid walking on wet floors.  Keep items that you use a lot in easy-to-reach places.  If you need to reach something above you, use a strong step stool that has a grab bar.  Keep electrical cords out of the way.  Do not use floor polish or wax that makes floors slippery. If you must use wax, use non-skid floor wax.  Do not have throw rugs and other things on the floor that can make you trip. What can I do with my stairs?  Do not leave any items on the stairs.  Make sure that there are handrails on both sides of the stairs and use them. Fix handrails that are  broken or loose. Make sure that handrails are as long as the stairways.  Check any carpeting to make sure that it is firmly attached to the stairs. Fix any carpet that is loose or worn.  Avoid having throw rugs at the top or bottom of the stairs. If you do have throw rugs, attach them to the floor with carpet tape.  Make sure that you have a light switch at the top of the stairs and the bottom of the stairs. If you do not have them, ask someone to add them for you. What else can I do to help prevent falls?  Wear shoes that:  Do not have high heels.  Have rubber bottoms.  Are comfortable and fit you well.  Are closed at the toe. Do not wear sandals.  If you use a stepladder:  Make sure that it is fully opened. Do not climb a closed stepladder.  Make sure that both sides of the stepladder are locked into place.  Ask someone to hold it for you, if possible.  Clearly mark and make sure that you can  see:  Any grab bars or handrails.  First and last steps.  Where the edge of each step is.  Use tools that help you move around (mobility aids) if they are needed. These include:  Canes.  Walkers.  Scooters.  Crutches.  Turn on the lights when you go into a dark area. Replace any light bulbs as soon as they burn out.  Set up your furniture so you have a clear path. Avoid moving your furniture around.  If any of your floors are uneven, fix them.  If there are any pets around you, be aware of where they are.  Review your medicines with your doctor. Some medicines can make you feel dizzy. This can increase your chance of falling. Ask your doctor what other things that you can do to help prevent falls. This information is not intended to replace advice given to you by your health care provider. Make sure you discuss any questions you have with your health care provider. Document Released: 05/16/2009 Document Revised: 12/26/2015 Document Reviewed: 08/24/2014 Elsevier Interactive Patient Education  2017 [employer].

## 2016-10-23 NOTE — Progress Notes (Signed)
Patient: Deanna Weaver Female    DOB: 1946/04/12   71 y.o.   MRN: [id number] Visit Date: 10/23/2016  Today's Provider: Lelon Huh, MD   Chief Complaint  Patient presents with  . Follow-up  . Hypertension   Subjective:     Patient present for follow up chronic problems and URI symptoms. She just completed [facility] with [facility].  Patient has symptoms of cough, sore throat, sinus drainage, and hoarseness.      Hypertension, follow-up:  BP Readings from Last 3 Encounters:  10/23/16 140/82  07/10/16 122/78  09/09/15 110/64    She was last seen for hypertension 3 months ago.  BP at that visit was 122/78. Management since that visit includes; no changes.She reports good compliance with treatment. She is not having side effects. none She is not exercising. She is adherent to low salt diet.   Outside blood pressures are normal. She is experiencing none.  Patient denies none.   Cardiovascular risk factors include none.  Use of agents associated with hypertension: none.   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 Lab Results  Component Value Date   CHOL 130 07/10/2016   HDL 28 (L) 07/10/2016   LDLCALC 67 07/10/2016   TRIG 173 (H) 07/10/2016   CHOLHDL 4.6 (H) 07/10/2016       Allergies  Allergen Reactions  . Celecoxib   . Metoprolol     Depression, palpitations.     Current Outpatient Prescriptions:  .  clotrimazole-betamethasone (LOTRISONE) cream, Apply 1 application topically 2 (two) times daily. (Patient not taking: Reported on 10/23/2016), Disp: 30 g, Rfl: 0 .  fluticasone (FLONASE) 50 MCG/ACT nasal spray, Place into the nose. 2(two) nasal each nostril once a day, Disp: , Rfl:  .  lisinopril (PRINIVIL,ZESTRIL) 5 MG tablet, TAKE 1 TABLET BY MOUTH EVERY DAY, Disp: 90 tablet, Rfl: 1 .  Omega-3 Fatty Acids (FISH OIL) 1000 MG CAPS, Take 4 capsules by mouth daily., Disp: , Rfl:  .   simvastatin (ZOCOR) 40 MG tablet, TAKE 1 TABLET BY MOUTH AT BEDTIME, Disp: 90 tablet, Rfl: 1  Review of Systems  HENT: Positive for congestion, sore throat and voice change.   Respiratory: Positive for cough.     Social History  Substance Use Topics  . Smoking status: Never Smoker  . Smokeless tobacco: Never Used  . Alcohol use No   Objective:   Vital Signs - Last Recorded  Most recent update: 10/23/2016 9:01 AM by Fabio Neighbors, LPN  BP  620/35 (BP Location: Right Arm)     Pulse  72     Temp  99.1 F (37.3 C) (Oral)     Ht  5\' 1"  (1.549 m)     Wt  150 lb 12.8 oz (68.4 kg)      BMI  28.49 kg/m       Physical Exam  General Appearance:    Alert, cooperative, no distress  HENT:   bilateral TM normal without fluid or infection, neck without nodes, throat normal without erythema or exudate and sinuses nontender Voice is hoarse  Eyes:    PERRL, conjunctiva/corneas clear, EOM's intact       Lungs:     Clear to auscultation bilaterally, respirations unlabored  Heart:    Regular rate and rhythm  Neurologic:   Awake, alert, oriented x 3. No apparent focal neurological           defect.  Assessment & Plan:     1. Essential hypertension Well controlled.  Continue current medications.   - EKG 12-Lead  2. Need for pneumococcal vaccination  - Pneumococcal polysaccharide vaccine 23-valent greater than or equal to 2yo subcutaneous/IM  3. Upper respiratory tract infection, unspecified type  azithromycin (ZITHROMAX) 250 MG tablet; 2 by mouth today, then 1 daily for 4 days  Dispense: 6 tablet; Refill: 0 Call if symptoms change or if not rapidly improving.     4. Laryngitis         Lelon Huh, MD  [facility]

## 2016-10-23 NOTE — Progress Notes (Signed)
Subjective:   Deanna Weaver is a 71 y.o. female who presents for Medicare Annual (Subsequent) preventive examination.  Review of Systems:  N/A  Cardiac Risk Factors include: advanced age (>72men, >60 women);dyslipidemia;hypertension     Objective:     Vitals: BP 140/82 (BP Location: Right Arm)   Pulse 72   Temp 99.1 F (37.3 C) (Oral)   Ht 5\' 1"  (1.549 m)   Wt 150 lb 12.8 oz (68.4 kg)   BMI 28.49 kg/m   Body mass index is 28.49 kg/m.   Tobacco History  Smoking Status  . Never Smoker  Smokeless Tobacco  . Never Used     Counseling given: Not Answered   Past Medical History:  Diagnosis Date  . Allergic rhinitis   . Allergy   . Hyperlipidemia   . Hypertension    Past Surgical History:  Procedure Laterality Date  . VAGINAL HYSTERECTOMY  1990   Family History  Problem Relation Age of Onset  . Cancer Mother   . Heart disease Father    History  Sexual Activity  . Sexual activity: Not on file    Outpatient Encounter Prescriptions as of 10/23/2016  Medication Sig  . fluticasone (FLONASE) 50 MCG/ACT nasal spray Place into the nose. 2(two) nasal each nostril once a day  . lisinopril (PRINIVIL,ZESTRIL) 5 MG tablet TAKE 1 TABLET BY MOUTH EVERY DAY  . Omega-3 Fatty Acids (FISH OIL) 1000 MG CAPS Take 4 capsules by mouth daily.  . simvastatin (ZOCOR) 40 MG tablet TAKE 1 TABLET BY MOUTH AT BEDTIME  . clotrimazole-betamethasone (LOTRISONE) cream Apply 1 application topically 2 (two) times daily. (Patient not taking: Reported on 10/23/2016)   No facility-administered encounter medications on file as of 10/23/2016.     Activities of Daily Living In your present state of health, do you have any difficulty performing the following activities: 10/23/2016  Hearing? Y  Vision? N  Difficulty concentrating or making decisions? N  Walking or climbing stairs? N  Dressing or bathing? N  Doing errands, shopping? N  Preparing Food and eating ? N  Using the Toilet? N  In the  past six months, have you accidently leaked urine? N  Do you have problems with loss of bowel control? N  Managing your Medications? N  Managing your Finances? N  Housekeeping or managing your Housekeeping? 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 Increase water intake          Recommend increasing water intake to 4 glasses a day.      Fall Risk Fall Risk  10/23/2016 07/10/2016 06/14/2015  Falls in the past year? No No No   Depression Screen PHQ 2/9 Scores 10/23/2016 10/23/2016 07/10/2016 06/14/2015  PHQ - 2 Score 0 0 0 0  PHQ- 9 Score 0 - - -     Cognitive Function     6CIT Screen 10/23/2016  What Year? 4 points  What month? 0 points  What time? 0 points  Count back from 20 0 points  Months in reverse 0 points  Repeat phrase 2 points  Total Score 6  Immunization History  Administered Date(s) Administered  . Pneumococcal Conjugate-13 11/03/2013   Screening Tests Health Maintenance  Topic Date Due  . INFLUENZA VACCINE  04/03/2017 (Originally 03/03/2016)  . PNA vac Low Risk Adult (2 of 2 - PPSV23) 10/01/2017 (Originally 11/04/2014)  . DEXA SCAN  08/03/2026 (Originally 05/02/2011)  . TETANUS/TDAP  08/03/2026 (Originally 05/01/1965)  . MAMMOGRAM  12/01/2017  . COLONOSCOPY  10/04/2018  . Hepatitis C Screening  Completed      Plan:  I have personally reviewed and addressed the Medicare Annual Wellness questionnaire and have noted the following in the patient's chart:  A. Medical and social history B. Use of alcohol, tobacco or illicit drugs  C. Current medications and supplements D. Functional ability  and status E.  Nutritional status F.  Physical activity G. Advance directives H. List of other physicians I.  Hospitalizations, surgeries, and ER visits in previous 12 months J.  Isabela such as hearing and vision if needed, cognitive and depression L. Referrals and appointments - none  In addition, I have reviewed and discussed with patient certain preventive protocols, quality metrics, and best practice recommendations. A written personalized care plan for preventive services as well as general preventive health recommendations were provided to patient.  See attached scanned questionnaire for additional information.   Signed,  Fabio Neighbors, LPN Nurse Health Advisor   MD Recommendations: Pt declined Influenza, tetanus and DEXA scan today. Also denied pneumovax 23 vaccine due to not feeling well. Patient states she will have this done at a later time.   I have reviewed the health advisor's note, was available for consultation, and agree with documentation and plan  Lelon Huh, MD

## 2016-10-24 LAB — HEPATITIS C ANTIBODY

## 2016-10-26 ENCOUNTER — Telehealth: Payer: Self-pay

## 2016-10-26 ENCOUNTER — Telehealth: Payer: Self-pay | Admitting: Emergency Medicine

## 2016-10-26 NOTE — Telephone Encounter (Signed)
Left message to call back  

## 2016-10-26 NOTE — Telephone Encounter (Signed)
FYI Pt reports that she had a reaction to her Pneumovax she got on Friday. It was red down to her elbow and swollen. She contacted the on call nurse over the weekend and was told to take Benadryl. She reports that it was better but wanted it to be noted in her chart.

## 2016-10-26 NOTE — Telephone Encounter (Signed)
-----   Message from Birdie Sons, MD sent at 10/24/2016  8:19 PM EDT ----- Hepatitis c is negative.

## 2016-12-23 ENCOUNTER — Other Ambulatory Visit: Payer: Self-pay | Admitting: Family Medicine

## 2017-01-24 ENCOUNTER — Other Ambulatory Visit: Payer: Self-pay | Admitting: Family Medicine

## 2017-01-24 DIAGNOSIS — B354 Tinea corporis: Secondary | ICD-10-CM

## 2017-05-25 ENCOUNTER — Encounter: Payer: Self-pay | Admitting: Physician Assistant

## 2017-05-25 ENCOUNTER — Ambulatory Visit (INDEPENDENT_AMBULATORY_CARE_PROVIDER_SITE_OTHER): Payer: Medicare Other | Admitting: Physician Assistant

## 2017-05-25 VITALS — BP 128/88 | HR 60 | Temp 97.4°F | Resp 16 | Wt 148.0 lb

## 2017-05-25 DIAGNOSIS — N3090 Cystitis, unspecified without hematuria: Secondary | ICD-10-CM | POA: Diagnosis not present

## 2017-05-25 DIAGNOSIS — R300 Dysuria: Secondary | ICD-10-CM | POA: Diagnosis not present

## 2017-05-25 NOTE — Patient Instructions (Signed)

## 2017-05-25 NOTE — Progress Notes (Signed)
[place]  Chief Complaint  Patient presents with  . Urinary Tract Infection    Started about a week ago.    Subjective:    Patient ID: Deanna Weaver, female    DOB: 1946-07-26, 71 y.o.   MRN: [id number]   Urinary Tract Infection: Patient complains of burning with urination and frequency. She has had symptoms for 1 week. Patient denies back pain. Patient does have a history of recurrent UTI.  Patient does have a history of pyelonephritis or other renal issues. Patient denies vaginal discharge and denies new sexual partners. The patient denies recent travel outside of the [place]. She says she felt like she had a yeast infection a week ago and used monistat 3 day treatment. She feels burning in vaginal area, during urination but also outside of urination. Not sure whether or not she has vaginal discharge.  Pt also complains of small lump on her fifth toe, right foot.  She first noticed it last week.  Pt believes it could be a corn but wanted to make sure.  She has been using OTC corn pads which has helped a lot.     HPI Review of Systems  Constitutional: Positive for malaise/fatigue. Negative for diaphoresis, fever and weight loss.  Gastrointestinal: Negative.   Genitourinary: Positive for dysuria.  Neurological: Negative for dizziness, weakness and headaches.       Objective:   BP 128/88 (BP Location: Left Arm, Patient Position: Sitting, Cuff Size: Normal)   Pulse 60   Temp (!) 97.4 F (36.3 C) (Oral)   Resp 16   Wt 148 lb (67.1 kg)   BMI 27.96 kg/m   Patient Active Problem List   Diagnosis Date Noted  . Allergic rhinitis 04/02/2015  . 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 Hypertension 04/02/2015  . Tremor 04/02/2015  . Varicose veins 04/02/2015  . LBP (low back pain) 06/11/2006  . 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 fluticasone (FLONASE) 50 MCG/ACT nasal spray Place into the nose. 2(two)  nasal each nostril once a day 04/02/2015: DX: 461.8 Received from: [place]: Place into the nose.  Marland Kitchen lisinopril (PRINIVIL,ZESTRIL) 5 MG tablet TAKE 1 TABLET BY MOUTH EVERY DAY   . Omega-3 Fatty Acids (FISH OIL) 1000 MG CAPS Take 4 capsules by mouth daily. 04/02/2015: Received from: [place]: Take by mouth.  . simvastatin (ZOCOR) 40 MG tablet TAKE 1 TABLET BY MOUTH AT BEDTIME   . clotrimazole-betamethasone (LOTRISONE) cream APPLY 1 APPLICATION TOPICALLY 2 (TWO) TIMES DAILY.    No facility-administered encounter medications on file as of 05/25/2017.     Allergies  Allergen Reactions  . Celecoxib   . Metoprolol     Depression, palpitations.       Physical Exam  Constitutional: She is oriented to person, place, and time. She appears well-developed and well-nourished. No distress.  Cardiovascular: Normal rate.   Pulmonary/Chest: Effort normal.  Abdominal: Soft. Bowel sounds are normal. There is no CVA tenderness.  Neurological: She is alert and oriented to person, place, and time.  Skin: Skin is warm and dry.  Psychiatric: She has a normal mood and affect. Her behavior is normal.       Assessment & Plan:  1. Dysuria  Cystitis vs. Possible yeast infection. Declines pelvic exam today. Unable to give sample in office. Patient dropped off sample a few hours after visit, positive for trace leukocytes and blood. Will  send in macrobid and send off for culture.  - POCT urinalysis dipstick - Urine Culture   Patient Instructions  Dysuria Dysuria is pain or discomfort while urinating. The pain or discomfort may be felt in the tube that carries urine out of the bladder (urethra) or in the surrounding tissue of the genitals. The pain may also be felt in the groin area, lower abdomen, and lower back. You may have to urinate frequently or have the sudden feeling that you have to urinate (urgency). Dysuria can affect both men and  women, but is more common in women. Dysuria can be caused by many different things, including:  Urinary tract infection in women.  Infection of the kidney or bladder.  Kidney stones or bladder stones.  Certain sexually transmitted infections (STIs), such as chlamydia.  Dehydration.  Inflammation of the vagina.  Use of certain medicines.  Use of certain soaps or scented products that cause irritation.  Follow these instructions at home: Watch your dysuria for any changes. The following actions may help to reduce any discomfort you are feeling:  Drink enough fluid to keep your urine clear or pale yellow.  Empty your bladder often. Avoid holding urine for long periods of time.  After a bowel movement or urination, women should cleanse from front to back, using each tissue only once.  Empty your bladder after sexual intercourse.  Take medicines only as directed by your health care provider.  If you were prescribed an antibiotic medicine, finish it all even if you start to feel better.  Avoid caffeine, tea, and alcohol. They can irritate the bladder and make dysuria worse. In men, alcohol may irritate the prostate.  Keep all follow-up visits as directed by your health care provider. This is important.  If you had any tests done to find the cause of dysuria, it is your responsibility to obtain your test results. Ask the lab or department performing the test when and how you will get your results. Talk with your health care provider if you have any questions about your results.  Contact a health care provider if:  You develop pain in your back or sides.  You have a fever.  You have nausea or vomiting.  You have blood in your urine.  You are not urinating as often as you usually do. Get help right away if:  You pain is severe and not relieved with medicines.  You are unable to hold down any fluids.  You or someone else notices a change in your mental function.  You  have a rapid heartbeat at rest.  You have shaking or chills.  You feel extremely weak. This information is not intended to replace advice given to you by your health care provider. Make sure you discuss any questions you have with your health care provider. Document Released: 04/17/2004 Document Revised: 12/26/2015 Document Reviewed: 03/15/2014 Elsevier Interactive Patient Education  [employer].      The entirety of the information documented in the History of Present Illness, Review of Systems and Physical Exam were personally obtained by me. Portions of this information were initially documented by Ashley Royalty, CMA and reviewed by me for thoroughness and accuracy.   Return if symptoms worsen or fail to improve.

## 2017-05-26 ENCOUNTER — Telehealth: Payer: Self-pay | Admitting: Family Medicine

## 2017-05-26 LAB — POCT URINALYSIS DIPSTICK
Bilirubin, UA: NEGATIVE
Glucose, UA: NEGATIVE
Ketones, UA: NEGATIVE
Nitrite, UA: NEGATIVE
Protein, UA: NEGATIVE
Spec Grav, UA: 1.025 (ref 1.010–1.025)
Urobilinogen, UA: 0.2 E.U./dL
pH, UA: 5 (ref 5.0–8.0)

## 2017-05-26 MED ORDER — NITROFURANTOIN MONOHYD MACRO 100 MG PO CAPS: 100.0000 mg | ORAL_CAPSULE | Freq: Two times a day (BID) | ORAL | 0 refills | Status: AC

## 2017-05-26 NOTE — Telephone Encounter (Signed)
Pt is requesting results from urine test.  [phone]

## 2017-05-26 NOTE — Telephone Encounter (Signed)
Tried calling; no answer.   Thanks,   -Taveon Enyeart  

## 2017-05-28 ENCOUNTER — Telehealth: Payer: Self-pay

## 2017-05-28 LAB — URINE CULTURE
MICRO NUMBER:: 81185697
SPECIMEN QUALITY:: ADEQUATE

## 2017-05-28 NOTE — Telephone Encounter (Signed)
Tried calling, no answer.   Thanks,   -Britney Captain  

## 2017-05-28 NOTE — Telephone Encounter (Signed)
-----   Message from Trinna Post, [place] sent at 05/28/2017  8:54 AM EDT ----- Urine cx positive for klebsiella. Sensitive to Macrobid that I sent in. How is patient feeling?

## 2017-05-31 NOTE — Telephone Encounter (Signed)
Pt advised.  She is feeling better.   Thanks,   -Mickel Baas

## 2017-07-01 ENCOUNTER — Other Ambulatory Visit: Payer: Self-pay | Admitting: Family Medicine

## 2017-08-06 DIAGNOSIS — L538 Other specified erythematous conditions: Secondary | ICD-10-CM | POA: Diagnosis not present

## 2017-08-06 DIAGNOSIS — L821 Other seborrheic keratosis: Secondary | ICD-10-CM | POA: Diagnosis not present

## 2017-08-06 DIAGNOSIS — L820 Inflamed seborrheic keratosis: Secondary | ICD-10-CM | POA: Diagnosis not present

## 2017-08-06 DIAGNOSIS — R202 Paresthesia of skin: Secondary | ICD-10-CM | POA: Diagnosis not present

## 2017-09-06 ENCOUNTER — Encounter: Payer: Self-pay | Admitting: Family Medicine

## 2017-09-06 ENCOUNTER — Ambulatory Visit (INDEPENDENT_AMBULATORY_CARE_PROVIDER_SITE_OTHER): Payer: Medicare Other | Admitting: Family Medicine

## 2017-09-06 VITALS — BP 130/90 | HR 74 | Temp 98.2°F | Resp 16 | Wt 149.0 lb

## 2017-09-06 DIAGNOSIS — H65191 Other acute nonsuppurative otitis media, right ear: Secondary | ICD-10-CM | POA: Diagnosis not present

## 2017-09-06 MED ORDER — AMOXICILLIN 500 MG PO CAPS: 1000.0000 mg | ORAL_CAPSULE | Freq: Three times a day (TID) | ORAL | 0 refills | Status: AC

## 2017-09-06 NOTE — Progress Notes (Signed)
Patient: Deanna Weaver Female    DOB: 05-31-1946   72 y.o.   MRN: [id number] Visit Date: 09/06/2017  Today's Provider: Lelon Huh, MD   Chief Complaint  Patient presents with  . Otalgia    right ear   Subjective:    Patient has had right ear pain for several days. Patient states pain has worsened since Saturday. Patient also has had nasal congestion and drainage for about 3 weeks, which is better now. Patient states both ears feel congested. She has been taking otc Flonase and claritin with mild relief.      Otalgia   There is pain in the right ear. This is a new problem. The current episode started in the past 7 days. The problem occurs constantly. The problem has been gradually worsening. There has been no fever. The pain is moderate. Associated symptoms include coughing, ear discharge, headaches, hearing loss and a sore throat. Pertinent negatives include no abdominal pain, diarrhea, neck pain, rash, rhinorrhea or vomiting. Treatments tried: flonase and Claritin. The treatment provided mild relief. Her past medical history is significant for a chronic ear infection.       Allergies  Allergen Reactions  . Celecoxib   . Metoprolol     Depression, palpitations.     Current Outpatient Medications:  .  clotrimazole-betamethasone (LOTRISONE) cream, APPLY 1 APPLICATION TOPICALLY 2 (TWO) TIMES DAILY., Disp: 30 g, Rfl: 5 .  fluticasone (FLONASE) 50 MCG/ACT nasal spray, Place into the nose. 2(two) nasal each nostril once a day, Disp: , Rfl:  .  lisinopril (PRINIVIL,ZESTRIL) 5 MG tablet, TAKE 1 TABLET BY MOUTH EVERY DAY, Disp: 90 tablet, Rfl: 1 .  Omega-3 Fatty Acids (FISH OIL) 1000 MG CAPS, Take 4 capsules by mouth daily., Disp: , Rfl:  .  simvastatin (ZOCOR) 40 MG tablet, TAKE 1 TABLET BY MOUTH AT BEDTIME, Disp: 90 tablet, Rfl: 1  Review of Systems  Constitutional: Negative for appetite change, chills, fatigue and fever.  HENT: Positive for congestion, ear discharge, ear  pain, hearing loss and sore throat. Negative for rhinorrhea.   Respiratory: Positive for cough. Negative for chest tightness and shortness of breath.   Cardiovascular: Negative for chest pain and palpitations.  Gastrointestinal: Negative for abdominal pain, diarrhea, nausea and vomiting.  Musculoskeletal: Negative for neck pain.  Skin: Negative for rash.  Neurological: Positive for headaches. Negative for dizziness and weakness.    Social History   Tobacco Use  . Smoking status: Never Smoker  . Smokeless tobacco: Never Used  Substance Use Topics  . Alcohol use: No   Objective:   BP 130/90 (BP Location: Right Arm, Patient Position: Sitting, Cuff Size: Normal)   Pulse 74   Temp 98.2 F (36.8 C) (Oral)   Resp 16   Wt 149 lb (67.6 kg)   SpO2 95%   BMI 28.15 kg/m  Vitals:   09/06/17 1516  BP: 130/90  Pulse: 74  Resp: 16  Temp: 98.2 F (36.8 C)  TempSrc: Oral  SpO2: 95%  Weight: 149 lb (67.6 kg)     Physical Exam  General Appearance:    Alert, cooperative, no distress  HENT:   right TM red, dull, bulging, left TM fluid noted, neck without nodes, throat normal without erythema or exudate, sinuses nontender and nasal mucosa congested  Eyes:    PERRL, conjunctiva/corneas clear, EOM's intact       Lungs:     Clear to auscultation bilaterally, respirations unlabored  Heart:  Regular rate and rhythm          Assessment & Plan:     1. Other acute nonsuppurative otitis media of right ear, recurrence not specified  - amoxicillin (AMOXIL) 500 MG capsule; Take 2 capsules (1,000 mg total) by mouth 3 (three) times daily for 5 days.  Dispense: 30 capsule; Refill: 0       Lelon Huh, MD  [facility]

## 2017-09-06 NOTE — Patient Instructions (Signed)
Otitis Media, Adult Otitis media is redness, soreness, and puffiness (swelling) in the space just behind your eardrum (middle ear). It may be caused by allergies or infection. It often happens along with a cold. Follow these instructions at home:  Take your medicine as told. Finish it even if you start to feel better.  Only take over-the-counter or prescription medicines for pain, discomfort, or fever as told by your doctor.  Follow up with your doctor as told. Contact a doctor if:  You have otitis media only in one ear, or bleeding from your nose, or both.  You notice a lump on your neck.  You are not getting better in 3-5 days.  You feel worse instead of better. Get help right away if:  You have pain that is not helped with medicine.  You have puffiness, redness, or pain around your ear.  You get a stiff neck.  You cannot move part of your face (paralysis).  You notice that the bone behind your ear hurts when you touch it. This information is not intended to replace advice given to you by your health care provider. Make sure you discuss any questions you have with your health care provider. Document Released: 01/06/2008 Document Revised: 12/26/2015 Document Reviewed: 02/14/2013 Elsevier Interactive Patient Education  2017 Elsevier Inc.  

## 2017-09-20 ENCOUNTER — Telehealth: Payer: Self-pay | Admitting: Family Medicine

## 2017-09-20 NOTE — Telephone Encounter (Signed)
Patient states that she was in for a office visit on 09/06/2017 and you gave her a antibiotic.  She states that her symptoms improved very little and she is still experiencing ear pain, drainage, throat irritation, and cough.  She states that it did not clear it all the way up and would like to know if you could send something else in.  She uses [facility] in [place].

## 2017-09-20 NOTE — Telephone Encounter (Signed)
Please advise 

## 2017-09-21 MED ORDER — AZITHROMYCIN 250 MG PO TABS: ORAL_TABLET | ORAL | 0 refills | Status: AC

## 2017-09-21 NOTE — Telephone Encounter (Signed)
Patient was advised.  

## 2017-09-21 NOTE — Telephone Encounter (Signed)
Please advise prescription azithromycin was [facility] in [place]. Return for recheck year if not feeling better within 1 week.

## 2017-10-18 ENCOUNTER — Encounter: Payer: Self-pay | Admitting: Family Medicine

## 2017-10-18 ENCOUNTER — Ambulatory Visit (INDEPENDENT_AMBULATORY_CARE_PROVIDER_SITE_OTHER): Payer: Medicare Other | Admitting: Family Medicine

## 2017-10-18 VITALS — BP 124/74 | HR 71 | Temp 98.2°F | Resp 16

## 2017-10-18 DIAGNOSIS — J329 Chronic sinusitis, unspecified: Secondary | ICD-10-CM

## 2017-10-18 DIAGNOSIS — J309 Allergic rhinitis, unspecified: Secondary | ICD-10-CM

## 2017-10-18 MED ORDER — MONTELUKAST SODIUM 10 MG PO TABS: 10.0000 mg | ORAL_TABLET | Freq: Every day | ORAL | 3 refills | Status: DC

## 2017-10-18 MED ORDER — AMOXICILLIN 500 MG PO CAPS: 1000.0000 mg | ORAL_CAPSULE | Freq: Two times a day (BID) | ORAL | 0 refills | Status: AC

## 2017-10-18 NOTE — Patient Instructions (Signed)
   Take OTC ibuprofen 3 x 200mg  every six hours for blisters and swollen lymph nodes.

## 2017-10-18 NOTE — Progress Notes (Signed)
Patient: Deanna Weaver Female    DOB: 1946/01/28   72 y.o.   MRN: [id number] Visit Date: 10/18/2017  Today's Provider: Lelon Huh, MD   Chief Complaint  Patient presents with  . Ear Pain   Subjective:    Patient has had left ear pain for 3 days. Patient also states her month is very sore. The back upper part of her throat and roof of month had bumpy, swollen area. Patient also has swelling in her lips x2 days. Other symptoms include: sinus pressure, slight cough, drainage and headache. Patient has been taking otc tylenol with mild relief.     Otalgia   There is pain in the left ear. This is a new problem. The current episode started in the past 7 days (3 days). The problem has been unchanged. There has been no fever. Associated symptoms include coughing, headaches, hearing loss and a sore throat. Pertinent negatives include no abdominal pain, diarrhea, ear discharge, neck pain, rash, rhinorrhea or vomiting. She has tried acetaminophen for the symptoms. The treatment provided mild relief.      Allergies  Allergen Reactions  . Celecoxib   . Metoprolol     Depression, palpitations.     Current Outpatient Medications:  .  clotrimazole-betamethasone (LOTRISONE) cream, APPLY 1 APPLICATION TOPICALLY 2 (TWO) TIMES DAILY., Disp: 30 g, Rfl: 5 .  fluticasone (FLONASE) 50 MCG/ACT nasal spray, Place into the nose. 2(two) nasal each nostril once a day, Disp: , Rfl:  .  lisinopril (PRINIVIL,ZESTRIL) 5 MG tablet, TAKE 1 TABLET BY MOUTH EVERY DAY, Disp: 90 tablet, Rfl: 1 .  Omega-3 Fatty Acids (FISH OIL) 1000 MG CAPS, Take 4 capsules by mouth daily., Disp: , Rfl:  .  simvastatin (ZOCOR) 40 MG tablet, TAKE 1 TABLET BY MOUTH AT BEDTIME, Disp: 90 tablet, Rfl: 1  Review of Systems  Constitutional: Negative for appetite change, chills, fatigue and fever.  HENT: Positive for ear pain, facial swelling, hearing loss, sinus pressure, sinus pain and sore throat. Negative for ear discharge and  rhinorrhea.   Respiratory: Positive for cough. Negative for chest tightness.   Cardiovascular: Negative for chest pain and palpitations.  Gastrointestinal: Negative for abdominal pain, diarrhea, nausea and vomiting.  Musculoskeletal: Negative for neck pain.  Skin: Negative for rash.  Neurological: Positive for headaches. Negative for dizziness and weakness.    Social History   Tobacco Use  . Smoking status: Never Smoker  . Smokeless tobacco: Never Used  Substance Use Topics  . Alcohol use: No   Objective:   BP 124/74 (BP Location: Right Arm, Patient Position: Sitting, Cuff Size: Normal)   Pulse 71   Temp 98.2 F (36.8 C) (Oral)   Resp 16   SpO2 96%  Vitals:   10/18/17 1045  BP: 124/74  Pulse: 71  Resp: 16  Temp: 98.2 F (36.8 C)  TempSrc: Oral  SpO2: 96%     Physical Exam  General Appearance:    Alert, cooperative, no distress  HENT:   bilateral TM fluid noted, neck has bilateral anterior cervical nodes enlarged, pharynx erythematous without exudate, frontal sinus tender and nasal mucosa pale and congested  Eyes:    PERRL, conjunctiva/corneas clear, EOM's intact       Lungs:     Clear to auscultation bilaterally, respirations unlabored  Heart:    Regular rate and rhythm  Neurologic:   Awake, alert, oriented x 3. No apparent focal neurological  defect.           Assessment & Plan:     1. Sinusitis, unspecified chronicity, unspecified location  - amoxicillin (AMOXIL) 500 MG capsule; Take 2 capsules (1,000 mg total) by mouth 2 (two) times daily for 10 days.  Dispense: 40 capsule; Refill: 0  2. Allergic rhinitis, unspecified seasonality, unspecified trigger  - montelukast (SINGULAIR) 10 MG tablet; Take 1 tablet (10 mg total) by mouth daily. For allergies  Dispense: 30 tablet; Refill: 3  Call if symptoms change or if not rapidly improving.          Lelon Huh, MD  [facility]

## 2017-10-27 ENCOUNTER — Ambulatory Visit (INDEPENDENT_AMBULATORY_CARE_PROVIDER_SITE_OTHER): Payer: Medicare Other

## 2017-10-27 VITALS — BP 122/72 | HR 72 | Temp 98.9°F | Ht 61.0 in | Wt 150.4 lb

## 2017-10-27 DIAGNOSIS — Z0000 Encounter for general adult medical examination without abnormal findings: Secondary | ICD-10-CM

## 2017-10-27 NOTE — Progress Notes (Signed)
Subjective:   Deanna Weaver is a 72 y.o. female who presents for Medicare Annual (Subsequent) preventive examination.  Review of Systems:  N/A        Objective:     Vitals: BP 122/72 (BP Location: Left Arm)   Pulse 72   Temp 98.9 F (37.2 C) (Oral)   Ht 5\' 1"  (1.549 m)   Wt 150 lb 6.4 oz (68.2 kg)   BMI 28.42 kg/m   Body mass index is 28.42 kg/m.  Advanced Directives 10/23/2016  Does Patient Have a Medical Advance Directive? No  Would patient like information on creating a medical advance directive? No - Patient declined    Tobacco Social History   Tobacco Use  Smoking Status Never Smoker  Smokeless Tobacco Never Used     Counseling given: Not Answered   Clinical Intake:     Pain Score: 0-No pain                 Past Medical History:  Diagnosis Date  . Allergic rhinitis   . Allergy   . Hyperlipidemia   . Hypertension    Past Surgical History:  Procedure Laterality Date  . VAGINAL HYSTERECTOMY  1990   Family History  Problem Relation Age of Onset  . Cancer Mother   . Heart disease Father    Social History   Socioeconomic History  . Marital status: Married    Spouse name: Not on file  . Number of children: 4  . Years of education: Not on file  . Highest education level: Not on file  Occupational History  . Occupation: Retired  Scientific laboratory technician  . Financial resource strain: Not on file  . Food insecurity:    Worry: Not on file    Inability: Not on file  . Transportation needs:    Medical: Not on file    Non-medical: Not on file  Tobacco Use  . Smoking status: Never Smoker  . Smokeless tobacco: Never Used  Substance and Sexual Activity  . Alcohol use: No  . Drug use: No  . Sexual activity: Not on file  Lifestyle  . Physical activity:    Days per week: Not on file    Minutes per session: Not on file  . Stress: Not on file  Relationships  . Social connections:    Talks on phone: Not on file    Gets together: Not on file    Attends religious service: Not on file    Active member of club or organization: Not on file    Attends meetings of clubs or organizations: Not on file    Relationship status: Not on file  Other Topics Concern  . Not on file  Social History Narrative  . Not on file    Outpatient Encounter Medications as of 10/27/2017  Medication Sig  . amoxicillin (AMOXIL) 500 MG capsule Take 2 capsules (1,000 mg total) by mouth 2 (two) times daily for 10 days.  . clotrimazole-betamethasone (LOTRISONE) cream APPLY 1 APPLICATION TOPICALLY 2 (TWO) TIMES DAILY.  . fluticasone (FLONASE) 50 MCG/ACT nasal spray Place into the nose. 2(two) nasal each nostril once a day  . lisinopril (PRINIVIL,ZESTRIL) 5 MG tablet TAKE 1 TABLET BY MOUTH EVERY DAY  . montelukast (SINGULAIR) 10 MG tablet Take 1 tablet (10 mg total) by mouth daily. For allergies  . Omega-3 Fatty Acids (FISH OIL) 1000 MG CAPS Take 4 capsules by mouth daily.  . simvastatin (ZOCOR) 40 MG tablet TAKE 1 TABLET BY  MOUTH AT BEDTIME   No facility-administered encounter medications on file as of 10/27/2017.     Activities of Daily Living No flowsheet data found.  Patient Care Team: Birdie Sons, MD as PCP - General (Family Medicine) Laurey Morale Wonda Cheng, MD (Obstetrics and Gynecology)    Assessment:   This is a routine wellness examination for Deanna Weaver.  Exercise Activities and Dietary recommendations    Goals    None      Fall Risk Fall Risk  10/23/2016 07/10/2016 06/14/2015  Falls in the past year? No No No   Is the patient's home free of loose throw rugs in walkways, pet beds, electrical cords, etc?   yes      Grab bars in the bathroom? no      Handrails on the stairs?   yes      Adequate lighting?   yes  Timed Get Up and Go performed: N/A  Depression Screen PHQ 2/9 Scores 10/23/2016 10/23/2016 07/10/2016 06/14/2015  PHQ - 2 Score 0 0 0 0  PHQ- 9 Score 0 - - -     Cognitive Function: Pt declined screening today.      6CIT Screen  10/23/2016  What Year? 4 points  What month? 0 points  What time? 0 points  Count back from 20 0 points  Months in reverse 0 points  Repeat phrase 2 points  Total Score 6    Immunization History  Administered Date(s) Administered  . Pneumococcal Conjugate-13 11/03/2013  . Pneumococcal Polysaccharide-23 10/23/2016    Qualifies for Shingles Vaccine? Due for Shingles vaccine. Declined my offer to administer today. Education has been provided regarding the importance of this vaccine. Pt has been advised to call her insurance company to determine her out of pocket expense. Advised she may also receive this vaccine at her local pharmacy or Health Dept. Verbalized acceptance and understanding.  Screening Tests Health Maintenance  Topic Date Due  . INFLUENZA VACCINE  03/03/2017  . DEXA SCAN  08/03/2026 (Originally 05/02/2011)  . TETANUS/TDAP  08/03/2026 (Originally 05/01/1965)  . MAMMOGRAM  12/01/2017  . COLONOSCOPY  10/04/2018  . Hepatitis C Screening  Completed  . PNA vac Low Risk Adult  Completed    Cancer Screenings: Lung: Low Dose CT Chest recommended if Age 47-80 years, 30 pack-year currently smoking OR have quit w/in 15years. Patient does not qualify. Breast:  Up to date on Mammogram? Yes   Up to date of Bone Density/Dexa? No, pt declined order today.  Colorectal: Up to date  Additional Screenings:  Hepatitis C Screening: N/A     Plan:  I have personally reviewed and addressed the Medicare Annual Wellness questionnaire and have noted the following in the patient's chart:  A. Medical and social history B. Use of alcohol, tobacco or illicit drugs  C. Current medications and supplements D. Functional ability and status E.  Nutritional status F.  Physical activity G. Advance directives H. List of other physicians I.  Hospitalizations, surgeries, and ER visits in previous 12 months J.  [name] such as hearing and vision if needed, cognitive and  depression L. Referrals and appointments - none  In addition, I have reviewed and discussed with patient certain preventive protocols, quality metrics, and best practice recommendations. A written personalized care plan for preventive services as well as general preventive health recommendations were provided to patient.  See attached scanned questionnaire for additional information.   Signed,  Fabio Neighbors, LPN Nurse Health Advisor   Nurse Recommendations:

## 2017-10-27 NOTE — Patient Instructions (Signed)
[name] , Thank you for taking time to come for your Medicare Wellness Visit. I appreciate your ongoing commitment to your health goals. Please review the following plan we discussed and let me know if I can assist you in the future.   Screening recommendations/referrals: Colonoscopy: Up to date Mammogram: Up to date Bone Density: Pt declines today.  Recommended yearly ophthalmology/optometry visit for glaucoma screening and checkup Recommended yearly dental visit for hygiene and checkup  Vaccinations: Influenza vaccine: Pt declines today.  Pneumococcal vaccine: Up to date Tdap vaccine: Pt declines today.  Shingles vaccine: Pt declines today.     Advanced directives: Advance directive discussed with you today. Even though you declined this today please call our office should you change your mind and we can give you the proper paperwork for you to fill out.  Conditions/risks identified: Recommend to start walking 3 days a week for 30 minutes at a time.  Next appointment: 11/04/17 @ 9:00 AM with Dr Caryn Section.    Preventive Care 72 Years and Older, Female Preventive care refers to lifestyle choices and visits with your health care provider that can promote health and wellness. What does preventive care include?  A yearly physical exam. This is also called an annual well check.  Dental exams once or twice a year.  Routine eye exams. Ask your health care provider how often you should have your eyes checked.  Personal lifestyle choices, including:  Daily care of your teeth and gums.  Regular physical activity.  Eating a healthy diet.  Avoiding tobacco and drug use.  Limiting alcohol use.  Practicing safe sex.  Taking low-dose aspirin every day.  Taking vitamin and mineral supplements as recommended by your health care provider. What happens during an annual well check? The services and screenings done by your health care provider during your annual well check will depend on  your age, overall health, lifestyle risk factors, and family history of disease. Counseling  Your health care provider may ask you questions about your:  Alcohol use.  Tobacco use.  Drug use.  Emotional well-being.  Home and relationship well-being.  Sexual activity.  Eating habits.  History of falls.  Memory and ability to understand (cognition).  Work and work Statistician.  Reproductive health. Screening  You may have the following tests or measurements:  Height, weight, and BMI.  Blood pressure.  Lipid and cholesterol levels. These may be checked every 5 years, or more frequently if you are over 65 years old.  Skin check.  Lung cancer screening. You may have this screening every year starting at age 72 if you have a 30-pack-year history of smoking and currently smoke or have quit within the past 15 years.  Fecal occult blood test (FOBT) of the stool. You may have this test every year starting at age 72.  Flexible sigmoidoscopy or colonoscopy. You may have a sigmoidoscopy every 5 years or a colonoscopy every 10 years starting at age 65.  Hepatitis C blood test.  Hepatitis B blood test.  Sexually transmitted disease (STD) testing.  Diabetes screening. This is done by checking your blood sugar (glucose) after you have not eaten for a while (fasting). You may have this done every 1-3 years.  Bone density scan. This is done to screen for osteoporosis. You may have this done starting at age 72.  Mammogram. This may be done every 1-2 years. Talk to your health care provider about how often you should have regular mammograms. Talk with your health  care provider about your test results, treatment options, and if necessary, the need for more tests. Vaccines  Your health care provider may recommend certain vaccines, such as:  Influenza vaccine. This is recommended every year.  Tetanus, diphtheria, and acellular pertussis (Tdap, Td) vaccine. You may need a Td booster  every 10 years.  Zoster vaccine. You may need this after age 70.  Pneumococcal 13-valent conjugate (PCV13) vaccine. One dose is recommended after age 3.  Pneumococcal polysaccharide (PPSV23) vaccine. One dose is recommended after age 62. Talk to your health care provider about which screenings and vaccines you need and how often you need them. This information is not intended to replace advice given to you by your health care provider. Make sure you discuss any questions you have with your health care provider. Document Released: 08/16/2015 Document Revised: 04/08/2016 Document Reviewed: 05/21/2015 Elsevier Interactive Patient Education  2017 [place] Prevention in the Home Falls can cause injuries. They can happen to people of all ages. There are many things you can do to make your home safe and to help prevent falls. What can I do on the outside of my home?  Regularly fix the edges of walkways and driveways and fix any cracks.  Remove anything that might make you trip as you walk through a door, such as a raised step or threshold.  Trim any bushes or trees on the path to your home.  Use bright outdoor lighting.  Clear any walking paths of anything that might make someone trip, such as rocks or tools.  Regularly check to see if handrails are loose or broken. Make sure that both sides of any steps have handrails.  Any raised decks and porches should have guardrails on the edges.  Have any leaves, snow, or ice cleared regularly.  Use sand or salt on walking paths during winter.  Clean up any spills in your garage right away. This includes oil or grease spills. What can I do in the bathroom?  Use night lights.  Install grab bars by the toilet and in the tub and shower. Do not use towel bars as grab bars.  Use non-skid mats or decals in the tub or shower.  If you need to sit down in the shower, use a plastic, non-slip stool.  Keep the floor dry. Clean up any  water that spills on the floor as soon as it happens.  Remove soap buildup in the tub or shower regularly.  Attach bath mats securely with double-sided non-slip rug tape.  Do not have throw rugs and other things on the floor that can make you trip. What can I do in the bedroom?  Use night lights.  Make sure that you have a light by your bed that is easy to reach.  Do not use any sheets or blankets that are too big for your bed. They should not hang down onto the floor.  Have a firm chair that has side arms. You can use this for support while you get dressed.  Do not have throw rugs and other things on the floor that can make you trip. What can I do in the kitchen?  Clean up any spills right away.  Avoid walking on wet floors.  Keep items that you use a lot in easy-to-reach places.  If you need to reach something above you, use a strong step stool that has a grab bar.  Keep electrical cords out of the way.  Do not use floor  polish or wax that makes floors slippery. If you must use wax, use non-skid floor wax.  Do not have throw rugs and other things on the floor that can make you trip. What can I do with my stairs?  Do not leave any items on the stairs.  Make sure that there are handrails on both sides of the stairs and use them. Fix handrails that are broken or loose. Make sure that handrails are as long as the stairways.  Check any carpeting to make sure that it is firmly attached to the stairs. Fix any carpet that is loose or worn.  Avoid having throw rugs at the top or bottom of the stairs. If you do have throw rugs, attach them to the floor with carpet tape.  Make sure that you have a light switch at the top of the stairs and the bottom of the stairs. If you do not have them, ask someone to add them for you. What else can I do to help prevent falls?  Wear shoes that:  Do not have high heels.  Have rubber bottoms.  Are comfortable and fit you well.  Are closed  at the toe. Do not wear sandals.  If you use a stepladder:  Make sure that it is fully opened. Do not climb a closed stepladder.  Make sure that both sides of the stepladder are locked into place.  Ask someone to hold it for you, if possible.  Clearly mark and make sure that you can see:  Any grab bars or handrails.  First and last steps.  Where the edge of each step is.  Use tools that help you move around (mobility aids) if they are needed. These include:  Canes.  Walkers.  Scooters.  Crutches.  Turn on the lights when you go into a dark area. Replace any light bulbs as soon as they burn out.  Set up your furniture so you have a clear path. Avoid moving your furniture around.  If any of your floors are uneven, fix them.  If there are any pets around you, be aware of where they are.  Review your medicines with your doctor. Some medicines can make you feel dizzy. This can increase your chance of falling. Ask your doctor what other things that you can do to help prevent falls. This information is not intended to replace advice given to you by your health care provider. Make sure you discuss any questions you have with your health care provider. Document Released: 05/16/2009 Document Revised: 12/26/2015 Document Reviewed: 08/24/2014 Elsevier Interactive Patient Education  2017 [employer].

## 2017-11-04 ENCOUNTER — Encounter: Payer: Self-pay | Admitting: Family Medicine

## 2017-11-04 ENCOUNTER — Ambulatory Visit (INDEPENDENT_AMBULATORY_CARE_PROVIDER_SITE_OTHER): Payer: Medicare Other | Admitting: Family Medicine

## 2017-11-04 VITALS — BP 128/88 | HR 61 | Temp 98.2°F | Resp 16 | Ht 61.0 in | Wt 148.0 lb

## 2017-11-04 DIAGNOSIS — M720 Palmar fascial fibromatosis [Dupuytren]: Secondary | ICD-10-CM | POA: Insufficient documentation

## 2017-11-04 DIAGNOSIS — E2839 Other primary ovarian failure: Secondary | ICD-10-CM

## 2017-11-04 DIAGNOSIS — Z6827 Body mass index (BMI) 27.0-27.9, adult: Secondary | ICD-10-CM | POA: Diagnosis not present

## 2017-11-04 DIAGNOSIS — I10 Essential (primary) hypertension: Secondary | ICD-10-CM

## 2017-11-04 DIAGNOSIS — E881 Lipodystrophy, not elsewhere classified: Secondary | ICD-10-CM

## 2017-11-04 NOTE — Progress Notes (Signed)
Patient: Deanna Weaver Female    DOB: 1946/02/16   72 y.o.   MRN: [id number] Visit Date: 11/04/2017  Today's Provider: Lelon Huh, MD   Chief Complaint  Patient presents with  . Follow-up  . Hypertension  . Hyperlipidemia   Subjective:   Patient saw McKenzie for AWV on 10/27/2017.  HPI   Hypertension, follow-up:  BP Readings from Last 3 Encounters:  11/04/17 128/88  10/27/17 122/72  10/18/17 124/74    She was last seen for hypertension 1 years ago.  BP at that visit was 140/82. Management since that visit includes; no changes.She reports good compliance with treatment. She is not having side effects. none She is exercising. She is adherent to low salt diet.   Outside blood pressures are not checking. She is experiencing none.  Patient denies none.   Cardiovascular risk factors include advanced age (older than 66 for men, 98 for women).  Use of agents associated with hypertension: none.   ----------------------------------------------------------------   Lipid/Cholesterol, Follow-up:   Last seen for this 1 years ago.  Management since that visit includes; no changes.  Last Lipid Panel:    Component Value Date/Time   CHOL 130 07/10/2016 1029   TRIG 173 (H) 07/10/2016 1029   HDL 28 (L) 07/10/2016 1029   CHOLHDL 4.6 (H) 07/10/2016 1029   LDLCALC 67 07/10/2016 1029    She reports good compliance with treatment. She is not having side effects. none  Wt Readings from Last 3 Encounters:  11/04/17 148 lb (67.1 kg)  10/27/17 150 lb 6.4 oz (68.2 kg)  09/06/17 149 lb (67.6 kg)    ----------------------------------------------------------------  She also reports swelling and difficulty extending left third finger. It is not painful and not limiting use of hand  Allergies  Allergen Reactions  . Celecoxib   . Metoprolol     Depression, palpitations.     Current Outpatient Medications:  .  clotrimazole-betamethasone (LOTRISONE) cream, APPLY 1  APPLICATION TOPICALLY 2 (TWO) TIMES DAILY., Disp: 30 g, Rfl: 5 .  fluticasone (FLONASE) 50 MCG/ACT nasal spray, Place into the nose. 2(two) nasal each nostril once a day, Disp: , Rfl:  .  lisinopril (PRINIVIL,ZESTRIL) 5 MG tablet, TAKE 1 TABLET BY MOUTH EVERY DAY, Disp: 90 tablet, Rfl: 1 .  montelukast (SINGULAIR) 10 MG tablet, Take 1 tablet (10 mg total) by mouth daily. For allergies, Disp: 30 tablet, Rfl: 3 .  Omega-3 Fatty Acids (FISH OIL) 1000 MG CAPS, Take 4 capsules by mouth daily., Disp: , Rfl:  .  simvastatin (ZOCOR) 40 MG tablet, TAKE 1 TABLET BY MOUTH AT BEDTIME, Disp: 90 tablet, Rfl: 1  Review of Systems  Constitutional: Negative for chills, diaphoresis and fever.  HENT: Negative for congestion, ear discharge, ear pain, hearing loss, nosebleeds, sore throat and tinnitus.   Eyes: Negative for photophobia, pain, discharge and redness.  Respiratory: Negative for cough, shortness of breath, wheezing and stridor.   Cardiovascular: Negative for chest pain, palpitations and leg swelling.  Gastrointestinal: Negative for abdominal pain, blood in stool, constipation, diarrhea, nausea and vomiting.  Endocrine: Negative for polydipsia.  Genitourinary: Negative for dysuria, flank pain, frequency, hematuria and urgency.  Musculoskeletal: Negative for back pain, myalgias and neck pain.  Skin: Negative for rash.  Allergic/Immunologic: Negative for environmental allergies.  Neurological: Negative for dizziness, tremors, seizures, weakness and headaches.  Hematological: Does not bruise/bleed easily.  Psychiatric/Behavioral: Negative for hallucinations and suicidal ideas. The patient is not nervous/anxious.   All other systems  reviewed and are negative.   Social History   Tobacco Use  . Smoking status: Never Smoker  . Smokeless tobacco: Never Used  Substance Use Topics  . Alcohol use: No   Objective:   BP 128/88 (BP Location: Right Arm, Patient Position: Sitting, Cuff Size: Normal)   Pulse  61   Temp 98.2 F (36.8 C) (Oral)   Resp 16   Ht 5\' 1"  (1.549 m)   Wt 148 lb (67.1 kg)   SpO2 95%   BMI 27.96 kg/m  Vitals:   11/04/17 0900  BP: 128/88  Pulse: 61  Resp: 16  Temp: 98.2 F (36.8 C)  TempSrc: Oral  SpO2: 95%  Weight: 148 lb (67.1 kg)  Height: 5\' 1"  (1.549 m)     Physical Exam   General Appearance:    Alert, cooperative, no distress  Eyes:    PERRL, conjunctiva/corneas clear, EOM's intact       Lungs:     Clear to auscultation bilaterally, respirations unlabored  Heart:    Regular rate and rhythm  Neurologic:   Awake, alert, oriented x 3. No apparent focal neurological           defect.   MS:  dupuytren contracture over right third metacarpal noted.        Assessment & Plan:     1. Estrogen deficiency  - DG Bone Density; Future  2. Essential hypertension Well controlled.  Continue current medications.   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   - Comprehensive metabolic panel - Lipid panel  4. Dupuytren's contracture of hand Discussed referral to hand surgeon for treatment. She states it Is not painful and not affecting use of hand and will defer treatment at this time.   5. BMI 27.0-27.9,adult [place] and is remains very active.        Lelon Huh, MD  [facility]

## 2017-11-04 NOTE — Patient Instructions (Addendum)
   The CDC recommends two doses of Shingrix (the shingles vaccine) separated by 2 to 6 months for adults age 72 years and older. I recommend checking with your pharmacy  plan regarding coverage for this vaccine.    You are also due for a Tdap (tetanus- diptheria-pertussis) vaccine. Please check with your pharmacist regarding coverage for this vaccine   Please call the [facility] [phone]) to schedule a routine screening mammogram.   Please contact your eyecare professional to schedule a routine eye exam   You are due for a bone density scan to evaluate risk for hip and vertebral fracture. You should a call from our referral coordinator Parke Poisson to schedule this test

## 2017-11-05 LAB — LIPID PANEL
CHOLESTEROL TOTAL: 129 mg/dL (ref 100–199)
Chol/HDL Ratio: 4 ratio (ref 0.0–4.4)
HDL: 32 mg/dL — ABNORMAL LOW (ref >39)
LDL Calculated: 72 mg/dL (ref 0–99)
Triglycerides: 127 mg/dL (ref 0–149)
VLDL CHOLESTEROL CAL: 25 mg/dL (ref 5–40)

## 2017-11-05 LAB — COMPREHENSIVE METABOLIC PANEL
A/G RATIO: 1.7 (ref 1.2–2.2)
ALK PHOS: 64 IU/L (ref 39–117)
ALT: 19 IU/L (ref 0–32)
AST: 22 IU/L (ref 0–40)
Albumin: 4.7 g/dL (ref 3.5–4.8)
BILIRUBIN TOTAL: 0.6 mg/dL (ref 0.0–1.2)
BUN/Creatinine Ratio: 18 (ref 12–28)
BUN: 16 mg/dL (ref 8–27)
CHLORIDE: 104 mmol/L (ref 96–106)
CO2: 23 mmol/L (ref 20–29)
Calcium: 9.9 mg/dL (ref 8.7–10.3)
Creatinine, Ser: 0.88 mg/dL (ref 0.57–1.00)
GFR calc non Af Amer: 66 mL/min/{1.73_m2} (ref >59)
GFR, EST AFRICAN AMERICAN: 76 mL/min/{1.73_m2} (ref >59)
GLUCOSE: 99 mg/dL (ref 65–99)
Globulin, Total: 2.7 g/dL (ref 1.5–4.5)
POTASSIUM: 4.4 mmol/L (ref 3.5–5.2)
Sodium: 144 mmol/L (ref 134–144)
TOTAL PROTEIN: 7.4 g/dL (ref 6.0–8.5)

## 2017-11-05 NOTE — Progress Notes (Signed)
Advised  ED 

## 2017-12-13 ENCOUNTER — Ambulatory Visit
Admission: RE | Admit: 2017-12-13 | Discharge: 2017-12-13 | Disposition: A | Discharge status: Home / Self Care | Payer: Medicare Other | Source: Ambulatory Visit | Attending: Family Medicine | Admitting: Family Medicine

## 2017-12-13 DIAGNOSIS — E2839 Other primary ovarian failure: Secondary | ICD-10-CM | POA: Insufficient documentation

## 2017-12-13 DIAGNOSIS — Z13820 Encounter for screening for osteoporosis: Secondary | ICD-10-CM | POA: Diagnosis not present

## 2017-12-21 DIAGNOSIS — H2513 Age-related nuclear cataract, bilateral: Secondary | ICD-10-CM | POA: Diagnosis not present

## 2017-12-27 ENCOUNTER — Other Ambulatory Visit: Payer: Self-pay | Admitting: Family Medicine

## 2018-05-03 ENCOUNTER — Telehealth: Payer: Self-pay | Admitting: Family Medicine

## 2018-05-03 ENCOUNTER — Other Ambulatory Visit: Payer: Self-pay | Admitting: Family Medicine

## 2018-05-03 DIAGNOSIS — J309 Allergic rhinitis, unspecified: Secondary | ICD-10-CM

## 2018-05-03 MED ORDER — AMOXICILLIN 500 MG PO CAPS
1000.0000 mg | ORAL_CAPSULE | Freq: Two times a day (BID) | ORAL | 0 refills | Status: AC
Start: 2018-05-03 — End: 2018-05-10

## 2018-05-03 NOTE — Telephone Encounter (Signed)
Per Karna Christmas, there were no more available openings today and patient didn't want to have to wait until tomorrow to be seen/ treated. Patient wants an antibiotic sent in for ear pain. She say Dr. Caryn Section knows that she has had this problem in the past.

## 2018-05-03 NOTE — Telephone Encounter (Signed)
Pt called complaining of left ear pain.  She said she has these frequently and usually gets in antibiotic.  She is wanting to know if you will send something to the pharmacy  [facility] uses [facility]  [address]  Pt's CB# [phone]  Thanks Con Memos

## 2018-06-23 DIAGNOSIS — H40003 Preglaucoma, unspecified, bilateral: Secondary | ICD-10-CM | POA: Diagnosis not present

## 2018-07-06 DIAGNOSIS — H40003 Preglaucoma, unspecified, bilateral: Secondary | ICD-10-CM | POA: Diagnosis not present

## 2018-07-06 DIAGNOSIS — H2513 Age-related nuclear cataract, bilateral: Secondary | ICD-10-CM | POA: Diagnosis not present

## 2018-08-10 DIAGNOSIS — R202 Paresthesia of skin: Secondary | ICD-10-CM | POA: Diagnosis not present

## 2018-08-10 DIAGNOSIS — L538 Other specified erythematous conditions: Secondary | ICD-10-CM | POA: Diagnosis not present

## 2018-08-10 DIAGNOSIS — B353 Tinea pedis: Secondary | ICD-10-CM | POA: Diagnosis not present

## 2018-08-10 DIAGNOSIS — L820 Inflamed seborrheic keratosis: Secondary | ICD-10-CM | POA: Diagnosis not present

## 2018-08-10 DIAGNOSIS — L720 Epidermal cyst: Secondary | ICD-10-CM | POA: Diagnosis not present

## 2018-08-10 DIAGNOSIS — R208 Other disturbances of skin sensation: Secondary | ICD-10-CM | POA: Diagnosis not present

## 2018-08-25 DIAGNOSIS — H409 Unspecified glaucoma: Secondary | ICD-10-CM | POA: Diagnosis not present

## 2018-10-04 DIAGNOSIS — I10 Essential (primary) hypertension: Secondary | ICD-10-CM | POA: Diagnosis not present

## 2018-10-04 DIAGNOSIS — E7800 Pure hypercholesterolemia, unspecified: Secondary | ICD-10-CM | POA: Diagnosis not present

## 2018-10-04 DIAGNOSIS — H2511 Age-related nuclear cataract, right eye: Secondary | ICD-10-CM | POA: Diagnosis not present

## 2018-10-12 ENCOUNTER — Encounter: Payer: Self-pay | Admitting: Anesthesiology

## 2018-10-12 ENCOUNTER — Other Ambulatory Visit: Payer: Self-pay

## 2018-10-12 ENCOUNTER — Encounter: Payer: Self-pay | Admitting: *Deleted

## 2018-10-24 ENCOUNTER — Ambulatory Visit: Admit: 2018-10-24 | Payer: Medicare Other | Admitting: Ophthalmology

## 2018-10-24 HISTORY — DX: Unspecified osteoarthritis, unspecified site: M19.90

## 2018-10-24 SURGERY — PHACOEMULSIFICATION, CATARACT, WITH IOL INSERTION
Anesthesia: Topical | Laterality: Right

## 2018-10-31 ENCOUNTER — Ambulatory Visit: Payer: Self-pay

## 2018-12-29 ENCOUNTER — Other Ambulatory Visit: Payer: Self-pay | Admitting: Family Medicine

## 2019-01-12 ENCOUNTER — Other Ambulatory Visit: Payer: Self-pay

## 2019-01-12 ENCOUNTER — Encounter
Admission: RE | Admit: 2019-01-12 | Discharge: 2019-01-12 | Disposition: A | Discharge status: Home / Self Care | Payer: Medicare Other | Source: Ambulatory Visit | Attending: Ophthalmology | Admitting: Ophthalmology

## 2019-01-12 DIAGNOSIS — Z1159 Encounter for screening for other viral diseases: Secondary | ICD-10-CM | POA: Diagnosis not present

## 2019-01-12 NOTE — Discharge Instructions (Signed)

## 2019-01-13 LAB — NOVEL CORONAVIRUS, NAA (HOSP ORDER, SEND-OUT TO REF LAB; TAT 18-24 HRS): SARS-CoV-2, NAA: NOT DETECTED

## 2019-01-16 ENCOUNTER — Ambulatory Visit: Payer: Medicare Other | Admitting: Anesthesiology

## 2019-01-16 ENCOUNTER — Encounter
Admission: RE | Disposition: A | Discharge status: Home / Self Care | Payer: Self-pay | Source: Home / Self Care | Attending: Ophthalmology

## 2019-01-16 ENCOUNTER — Ambulatory Visit
Admission: RE | Admit: 2019-01-16 | Discharge: 2019-01-16 | Disposition: A | Discharge status: Home / Self Care | Payer: Medicare Other | Attending: Ophthalmology | Admitting: Ophthalmology

## 2019-01-16 DIAGNOSIS — Z888 Allergy status to other drugs, medicaments and biological substances status: Secondary | ICD-10-CM | POA: Diagnosis not present

## 2019-01-16 DIAGNOSIS — H2511 Age-related nuclear cataract, right eye: Secondary | ICD-10-CM | POA: Diagnosis not present

## 2019-01-16 DIAGNOSIS — Z85828 Personal history of other malignant neoplasm of skin: Secondary | ICD-10-CM | POA: Diagnosis not present

## 2019-01-16 DIAGNOSIS — H25811 Combined forms of age-related cataract, right eye: Secondary | ICD-10-CM | POA: Diagnosis not present

## 2019-01-16 DIAGNOSIS — I10 Essential (primary) hypertension: Secondary | ICD-10-CM | POA: Insufficient documentation

## 2019-01-16 DIAGNOSIS — E7800 Pure hypercholesterolemia, unspecified: Secondary | ICD-10-CM | POA: Insufficient documentation

## 2019-01-16 HISTORY — PX: CATARACT EXTRACTION W/PHACO: SHX586

## 2019-01-16 SURGERY — PHACOEMULSIFICATION, CATARACT, WITH IOL INSERTION
Anesthesia: Monitor Anesthesia Care | Site: Eye | Laterality: Right

## 2019-01-16 MED ORDER — SODIUM HYALURONATE 23 MG/ML IO SOLN
INTRAOCULAR | Status: DC | PRN
  Administered 2019-01-16: 0.6 mL via INTRAOCULAR

## 2019-01-16 MED ORDER — MIDAZOLAM HCL 2 MG/2ML IJ SOLN
INTRAMUSCULAR | Status: DC | PRN
  Administered 2019-01-16 (×2): 1 mg via INTRAVENOUS

## 2019-01-16 MED ORDER — MOXIFLOXACIN HCL 0.5 % OP SOLN
OPHTHALMIC | Status: DC | PRN
  Administered 2019-01-16: 0.2 mL via OPHTHALMIC

## 2019-01-16 MED ORDER — SODIUM HYALURONATE 10 MG/ML IO SOLN
INTRAOCULAR | Status: DC | PRN
  Administered 2019-01-16: 0.55 mL via INTRAOCULAR

## 2019-01-16 MED ORDER — LIDOCAINE HCL (PF) 2 % IJ SOLN
INTRAOCULAR | Status: DC | PRN
  Administered 2019-01-16: 09:00:00 1 mL via INTRAOCULAR

## 2019-01-16 MED ORDER — ONDANSETRON HCL 4 MG/2ML IJ SOLN: 4.0000 mg | Freq: Once | INTRAMUSCULAR | Status: DC | PRN

## 2019-01-16 MED ORDER — FENTANYL CITRATE (PF) 100 MCG/2ML IJ SOLN
INTRAMUSCULAR | Status: DC | PRN
  Administered 2019-01-16: 50 ug via INTRAVENOUS

## 2019-01-16 MED ORDER — TETRACAINE HCL 0.5 % OP SOLN
1.0000 [drp] | OPHTHALMIC | Status: DC | PRN
  Administered 2019-01-16 (×3): 1 [drp] via OPHTHALMIC

## 2019-01-16 MED ORDER — EPINEPHRINE PF 1 MG/ML IJ SOLN
INTRAOCULAR | Status: DC | PRN
  Administered 2019-01-16: 87 mL via OPHTHALMIC

## 2019-01-16 MED ORDER — ARMC OPHTHALMIC DILATING DROPS
1.0000 "application " | OPHTHALMIC | Status: DC | PRN
  Administered 2019-01-16 (×3): 1 via OPHTHALMIC

## 2019-01-16 SURGICAL SUPPLY — 17 items
CANNULA ANT/CHMB 27GA (MISCELLANEOUS) ×4 IMPLANT
DISSECTOR HYDRO NUCLEUS 50X22 (MISCELLANEOUS) ×2 IMPLANT
GLOVE SURG LX 7.5 STRW (GLOVE) ×2
GLOVE SURG LX STRL 7.5 STRW (GLOVE) ×2 IMPLANT
GLOVE SURG SYN 8.5  E (GLOVE) ×1
GLOVE SURG SYN 8.5 E (GLOVE) ×1 IMPLANT
GOWN STRL REUS W/ TWL LRG LVL3 (GOWN DISPOSABLE) ×2 IMPLANT
GOWN STRL REUS W/TWL LRG LVL3 (GOWN DISPOSABLE) ×2
LENS IOL TECNIS ITEC 25.0 (Intraocular Lens) ×2 IMPLANT
MARKER SKIN DUAL TIP RULER LAB (MISCELLANEOUS) ×2 IMPLANT
PACK DR. KING ARMS (PACKS) ×2 IMPLANT
PACK EYE AFTER SURG (MISCELLANEOUS) ×2 IMPLANT
PACK OPTHALMIC (MISCELLANEOUS) ×2 IMPLANT
SYR 3ML LL SCALE MARK (SYRINGE) ×2 IMPLANT
SYR TB 1ML LUER SLIP (SYRINGE) ×2 IMPLANT
WATER STERILE IRR 250ML POUR (IV SOLUTION) ×2 IMPLANT
WIPE NON LINTING 3.25X3.25 (MISCELLANEOUS) ×2 IMPLANT

## 2019-01-16 NOTE — Op Note (Signed)
OPERATIVE NOTE  Deanna Weaver [id number] 01/16/2019   PREOPERATIVE DIAGNOSIS:  Nuclear sclerotic cataract right eye.  H25.11   POSTOPERATIVE DIAGNOSIS:    Nuclear sclerotic cataract right eye.     PROCEDURE:  Phacoemusification with posterior chamber intraocular lens placement of the right eye   LENS:   Implant Name Type Inv. Item Serial No. Manufacturer Lot No. LRB No. Used Action  LENS IOL DIOP 25.0 - [id number] Intraocular Lens LENS IOL DIOP 25.0 [id number]  Right 1 Implanted       PCB00 +25.0   ULTRASOUND TIME: 1 minutes 07 seconds.  CDE 10.85   SURGEON:  Benay Pillow, MD, MPH  ANESTHESIOLOGIST: Anesthesiologist: Veda Canning, MD CRNA: Cameron Ali, CRNA   ANESTHESIA:  Topical with tetracaine drops augmented with 1% preservative-free intracameral lidocaine.  ESTIMATED BLOOD LOSS: less than 1 mL.   COMPLICATIONS:  None.   DESCRIPTION OF PROCEDURE:  The patient was identified in the holding room and transported to the operating room and placed in the supine position under the operating microscope.  The right eye was identified as the operative eye and it was prepped and draped in the usual sterile ophthalmic fashion.   A 1.0 millimeter clear-corneal paracentesis was made at the 10:30 position. 0.5 ml of preservative-free 1% lidocaine with epinephrine was injected into the anterior chamber.  The anterior chamber was filled with Healon 5 viscoelastic.  A 2.4 millimeter keratome was used to make a near-clear corneal incision at the 8:00 position.  A curvilinear capsulorrhexis was made with a cystotome and capsulorrhexis forceps.  Balanced salt solution was used to hydrodissect and hydrodelineate the nucleus.   Phacoemulsification was then used in stop and chop fashion to remove the lens nucleus and epinucleus.  The remaining cortex was then removed using the irrigation and aspiration handpiece. Healon was then placed into the capsular bag to distend it for lens placement.  A  lens was then injected into the capsular bag.  The remaining viscoelastic was aspirated.   Wounds were hydrated with balanced salt solution.  The anterior chamber was inflated to a physiologic pressure with balanced salt solution.   Intracameral vigamox 0.1 mL undiluted was injected into the eye and a drop placed onto the ocular surface.  No wound leaks were noted.  The patient was taken to the recovery room in stable condition without complications of anesthesia or surgery  Benay Pillow 01/16/2019, 9:19 AM

## 2019-01-16 NOTE — Anesthesia Procedure Notes (Signed)
Procedure Name: [place] Performed by: Cameron Ali, CRNA Pre-anesthesia Checklist: Patient identified, Emergency Drugs available, Suction available, Timeout performed and Patient being monitored Patient Re-evaluated:Patient Re-evaluated prior to induction Oxygen Delivery Method: Nasal cannula Placement Confirmation: positive ETCO2

## 2019-01-16 NOTE — Anesthesia Preprocedure Evaluation (Signed)
Anesthesia Evaluation  Patient identified by MRN, date of birth, ID band Patient awake    Reviewed: Allergy & Precautions, NPO status , Patient's Chart, lab work & pertinent test results  Airway Mallampati: II  TM Distance: >3 FB     Dental   Pulmonary    breath sounds clear to auscultation       Cardiovascular hypertension,  Rhythm:Regular Rate:Normal  HLD   Neuro/Psych  Headaches,    GI/Hepatic   Endo/Other    Renal/GU      Musculoskeletal  (+) Arthritis ,   Abdominal   Peds  Hematology   Anesthesia Other Findings   Reproductive/Obstetrics                             Anesthesia Physical Anesthesia Plan  ASA: II  Anesthesia Plan: MAC   Post-op Pain Management:    Induction: Intravenous  PONV Risk Score and Plan: Midazolam  Airway Management Planned: Nasal Cannula and Natural Airway  Additional Equipment:   Intra-op Plan:   Post-operative Plan:   Informed Consent: I have reviewed the patients History and Physical, chart, labs and discussed the procedure including the risks, benefits and alternatives for the proposed anesthesia with the patient or authorized representative who has indicated his/her understanding and acceptance.       Plan Discussed with: CRNA  Anesthesia Plan Comments:         Anesthesia Quick Evaluation

## 2019-01-16 NOTE — Transfer of Care (Signed)
Immediate Anesthesia Transfer of Care Note  Patient: Deanna Weaver  Procedure(s) Performed: CATARACT EXTRACTION PHACO AND INTRAOCULAR LENS PLACEMENT (IOC) RIGHT (Right Eye)  Patient Location: PACU  Anesthesia Type: MAC  Level of Consciousness: awake, alert  and patient cooperative  Airway and Oxygen Therapy: Patient Spontanous Breathing and Patient connected to supplemental oxygen  Post-op Assessment: Post-op Vital signs reviewed, Patient's Cardiovascular Status Stable, Respiratory Function Stable, Patent Airway and No signs of Nausea or vomiting  Post-op Vital Signs: Reviewed and stable  Complications: No apparent anesthesia complications

## 2019-01-16 NOTE — H&P (Signed)

## 2019-01-16 NOTE — Anesthesia Postprocedure Evaluation (Signed)
Anesthesia Post Note  Patient: Deanna Weaver  Procedure(s) Performed: CATARACT EXTRACTION PHACO AND INTRAOCULAR LENS PLACEMENT (IOC) RIGHT (Right Eye)  Patient location during evaluation: PACU Anesthesia Type: MAC Level of consciousness: awake and alert Pain management: pain level controlled Vital Signs Assessment: post-procedure vital signs reviewed and stable Respiratory status: spontaneous breathing, nonlabored ventilation, respiratory function stable and patient connected to nasal cannula oxygen Cardiovascular status: stable and blood pressure returned to baseline Postop Assessment: no apparent nausea or vomiting Anesthetic complications: no    Veda Canning

## 2019-01-17 ENCOUNTER — Encounter: Payer: Self-pay | Admitting: Ophthalmology

## 2019-02-23 DIAGNOSIS — H2512 Age-related nuclear cataract, left eye: Secondary | ICD-10-CM | POA: Diagnosis not present

## 2019-03-02 ENCOUNTER — Other Ambulatory Visit: Payer: Self-pay

## 2019-03-07 ENCOUNTER — Other Ambulatory Visit: Payer: Self-pay | Admitting: Family Medicine

## 2019-03-07 ENCOUNTER — Telehealth (INDEPENDENT_AMBULATORY_CARE_PROVIDER_SITE_OTHER): Payer: Medicare Other | Admitting: Family Medicine

## 2019-03-07 DIAGNOSIS — R319 Hematuria, unspecified: Secondary | ICD-10-CM

## 2019-03-07 DIAGNOSIS — N390 Urinary tract infection, site not specified: Secondary | ICD-10-CM

## 2019-03-07 LAB — POCT URINALYSIS DIPSTICK
Bilirubin, UA: NEGATIVE
Glucose, UA: NEGATIVE
Ketones, UA: NEGATIVE
Nitrite, UA: POSITIVE
Protein, UA: NEGATIVE
Spec Grav, UA: 1.025 (ref 1.010–1.025)
Urobilinogen, UA: 0.2 E.U./dL
pH, UA: 6 (ref 5.0–8.0)

## 2019-03-07 MED ORDER — CIPROFLOXACIN HCL 500 MG PO TABS: 500.0000 mg | ORAL_TABLET | Freq: Two times a day (BID) | ORAL | 0 refills | Status: AC

## 2019-03-07 NOTE — Telephone Encounter (Signed)
Patient dropped off urine sample. Please review results. I sent urine to the lab for culture also.

## 2019-03-07 NOTE — Telephone Encounter (Signed)
Pt not wanting to come in due to keeping children and COVID.  Pt thinking she has a UTI.  Wanting to know if she can drop off urine to be tested.  Please advise.  Thanks, [employer]

## 2019-03-07 NOTE — Telephone Encounter (Signed)
Pt needs to drop off urine in a sterile cup.  I advised pts husband and sent him with the supplies.  Tried calling pt but she did not answer.   Thanks,   -Mickel Baas

## 2019-03-07 NOTE — Telephone Encounter (Signed)
That's fine

## 2019-03-07 NOTE — Telephone Encounter (Signed)
Pt called stated that since she hadn't heard back from her call earlier this morning, since her husband had to come into town she went ahead and sent him with her urine specimen. Please advise. Thanks TNP

## 2019-03-08 ENCOUNTER — Telehealth: Payer: Self-pay

## 2019-03-08 NOTE — Telephone Encounter (Signed)
Tried to call patient in regards to medications being sent to the pharmacy. No answer, unable to leave voicemail.

## 2019-03-09 LAB — URINE CULTURE

## 2019-03-10 NOTE — Telephone Encounter (Signed)
Pt advised.   Thanks,   -Laura  

## 2019-03-10 NOTE — Telephone Encounter (Signed)
-----   Message from Birdie Sons, MD sent at 03/09/2019 12:53 PM EDT ----- Urine culture shows infection sensitive to antibiotic that was prescribed. Symptoms should completely resolve by the time antibiotic is finished. Call back otherwise.

## 2019-03-16 DIAGNOSIS — D1801 Hemangioma of skin and subcutaneous tissue: Secondary | ICD-10-CM | POA: Diagnosis not present

## 2019-03-20 ENCOUNTER — Encounter: Payer: Self-pay | Admitting: *Deleted

## 2019-03-20 ENCOUNTER — Other Ambulatory Visit: Payer: Self-pay

## 2019-03-21 ENCOUNTER — Encounter: Payer: Self-pay | Admitting: Ophthalmology

## 2019-03-22 NOTE — Discharge Instructions (Signed)

## 2019-03-23 ENCOUNTER — Other Ambulatory Visit: Payer: Self-pay

## 2019-03-23 ENCOUNTER — Other Ambulatory Visit
Admission: RE | Admit: 2019-03-23 | Discharge: 2019-03-23 | Disposition: A | Discharge status: Home / Self Care | Payer: Medicare Other | Source: Ambulatory Visit | Attending: Ophthalmology | Admitting: Ophthalmology

## 2019-03-23 DIAGNOSIS — Z01812 Encounter for preprocedural laboratory examination: Secondary | ICD-10-CM | POA: Diagnosis not present

## 2019-03-23 DIAGNOSIS — Z20828 Contact with and (suspected) exposure to other viral communicable diseases: Secondary | ICD-10-CM | POA: Diagnosis not present

## 2019-03-23 LAB — SARS CORONAVIRUS 2 (TAT 6-24 HRS): SARS Coronavirus 2: NEGATIVE

## 2019-03-27 ENCOUNTER — Other Ambulatory Visit: Payer: Self-pay

## 2019-03-27 ENCOUNTER — Encounter
Admission: RE | Disposition: A | Discharge status: Home / Self Care | Payer: Self-pay | Source: Home / Self Care | Attending: Ophthalmology

## 2019-03-27 ENCOUNTER — Ambulatory Visit: Payer: Medicare Other | Admitting: Anesthesiology

## 2019-03-27 ENCOUNTER — Ambulatory Visit
Admission: RE | Admit: 2019-03-27 | Discharge: 2019-03-27 | Disposition: A | Discharge status: Home / Self Care | Payer: Medicare Other | Attending: Ophthalmology | Admitting: Ophthalmology

## 2019-03-27 DIAGNOSIS — H2512 Age-related nuclear cataract, left eye: Secondary | ICD-10-CM | POA: Diagnosis not present

## 2019-03-27 DIAGNOSIS — I10 Essential (primary) hypertension: Secondary | ICD-10-CM | POA: Insufficient documentation

## 2019-03-27 DIAGNOSIS — Z79899 Other long term (current) drug therapy: Secondary | ICD-10-CM | POA: Insufficient documentation

## 2019-03-27 DIAGNOSIS — H25812 Combined forms of age-related cataract, left eye: Secondary | ICD-10-CM | POA: Diagnosis not present

## 2019-03-27 DIAGNOSIS — E7800 Pure hypercholesterolemia, unspecified: Secondary | ICD-10-CM | POA: Diagnosis not present

## 2019-03-27 DIAGNOSIS — M1990 Unspecified osteoarthritis, unspecified site: Secondary | ICD-10-CM | POA: Diagnosis not present

## 2019-03-27 HISTORY — PX: CATARACT EXTRACTION W/PHACO: SHX586

## 2019-03-27 SURGERY — PHACOEMULSIFICATION, CATARACT, WITH IOL INSERTION
Anesthesia: Monitor Anesthesia Care | Site: Eye | Laterality: Left

## 2019-03-27 MED ORDER — TETRACAINE HCL 0.5 % OP SOLN
1.0000 [drp] | OPHTHALMIC | Status: DC | PRN
  Administered 2019-03-27 (×3): 1 [drp] via OPHTHALMIC

## 2019-03-27 MED ORDER — LIDOCAINE HCL (PF) 2 % IJ SOLN
INTRAOCULAR | Status: DC | PRN
  Administered 2019-03-27: 1 mL via INTRAOCULAR

## 2019-03-27 MED ORDER — MOXIFLOXACIN HCL 0.5 % OP SOLN
OPHTHALMIC | Status: DC | PRN
  Administered 2019-03-27: 0.2 mL via OPHTHALMIC

## 2019-03-27 MED ORDER — SODIUM HYALURONATE 10 MG/ML IO SOLN
INTRAOCULAR | Status: DC | PRN
  Administered 2019-03-27: 0.55 mL via INTRAOCULAR

## 2019-03-27 MED ORDER — ACETAMINOPHEN 160 MG/5ML PO SOLN: 325.0000 mg | Freq: Once | ORAL | Status: DC

## 2019-03-27 MED ORDER — MIDAZOLAM HCL 2 MG/2ML IJ SOLN
INTRAMUSCULAR | Status: DC | PRN
  Administered 2019-03-27 (×2): 1 mg via INTRAVENOUS

## 2019-03-27 MED ORDER — ARMC OPHTHALMIC DILATING DROPS
1.0000 "application " | OPHTHALMIC | Status: DC | PRN
  Administered 2019-03-27 (×3): 1 via OPHTHALMIC

## 2019-03-27 MED ORDER — FENTANYL CITRATE (PF) 100 MCG/2ML IJ SOLN
INTRAMUSCULAR | Status: DC | PRN
  Administered 2019-03-27: 50 ug via INTRAVENOUS

## 2019-03-27 MED ORDER — ACETAMINOPHEN 325 MG PO TABS: 325.0000 mg | ORAL_TABLET | Freq: Once | ORAL | Status: DC

## 2019-03-27 MED ORDER — SODIUM HYALURONATE 23 MG/ML IO SOLN
INTRAOCULAR | Status: DC | PRN
  Administered 2019-03-27: 0.6 mL via INTRAOCULAR

## 2019-03-27 MED ORDER — EPINEPHRINE PF 1 MG/ML IJ SOLN
INTRAOCULAR | Status: DC | PRN
  Administered 2019-03-27: 60 mL via OPHTHALMIC

## 2019-03-27 SURGICAL SUPPLY — 19 items
CANNULA ANT/CHMB 27G (MISCELLANEOUS) ×2 IMPLANT
CANNULA ANT/CHMB 27GA (MISCELLANEOUS) ×4 IMPLANT
DISSECTOR HYDRO NUCLEUS 50X22 (MISCELLANEOUS) ×2 IMPLANT
GLOVE SURG LX 7.5 STRW (GLOVE) ×1
GLOVE SURG LX STRL 7.5 STRW (GLOVE) ×1 IMPLANT
GLOVE SURG SYN 8.5  E (GLOVE) ×1
GLOVE SURG SYN 8.5 E (GLOVE) ×1 IMPLANT
GLOVE SURG SYN 8.5 PF PI (GLOVE) ×1 IMPLANT
GOWN STRL REUS W/ TWL LRG LVL3 (GOWN DISPOSABLE) ×2 IMPLANT
GOWN STRL REUS W/TWL LRG LVL3 (GOWN DISPOSABLE) ×2
LENS IOL TECNIS ITEC 24.5 (Intraocular Lens) ×1 IMPLANT
MARKER SKIN DUAL TIP RULER LAB (MISCELLANEOUS) ×2 IMPLANT
PACK DR. KING ARMS (PACKS) ×2 IMPLANT
PACK EYE AFTER SURG (MISCELLANEOUS) ×2 IMPLANT
PACK OPTHALMIC (MISCELLANEOUS) ×2 IMPLANT
SYR 3ML LL SCALE MARK (SYRINGE) ×2 IMPLANT
SYR TB 1ML LUER SLIP (SYRINGE) ×2 IMPLANT
WATER STERILE IRR 250ML POUR (IV SOLUTION) ×2 IMPLANT
WIPE NON LINTING 3.25X3.25 (MISCELLANEOUS) ×2 IMPLANT

## 2019-03-27 NOTE — Anesthesia Preprocedure Evaluation (Signed)
Anesthesia Evaluation  Patient identified by MRN, date of birth, ID band Patient awake    Reviewed: Allergy & Precautions, H&P , NPO status , Patient's Chart, lab work & pertinent test results  Airway Mallampati: II  TM Distance: >3 FB Neck ROM: full    Dental no notable dental hx.    Pulmonary    Pulmonary exam normal breath sounds clear to auscultation       Cardiovascular hypertension, Normal cardiovascular exam Rhythm:regular Rate:Normal  HLD   Neuro/Psych  Headaches,    GI/Hepatic   Endo/Other    Renal/GU      Musculoskeletal  (+) Arthritis ,   Abdominal   Peds  Hematology   Anesthesia Other Findings   Reproductive/Obstetrics                             Anesthesia Physical  Anesthesia Plan  ASA: II  Anesthesia Plan: MAC   Post-op Pain Management:    Induction: Intravenous  PONV Risk Score and Plan: 2 and Midazolam, TIVA and Treatment may vary due to age or medical condition  Airway Management Planned: Nasal Cannula and Natural Airway  Additional Equipment:   Intra-op Plan:   Post-operative Plan:   Informed Consent: I have reviewed the patients History and Physical, chart, labs and discussed the procedure including the risks, benefits and alternatives for the proposed anesthesia with the patient or authorized representative who has indicated his/her understanding and acceptance.       Plan Discussed with: CRNA  Anesthesia Plan Comments:         Anesthesia Quick Evaluation

## 2019-03-27 NOTE — Transfer of Care (Signed)
Immediate Anesthesia Transfer of Care Note  Patient: Deanna Weaver  Procedure(s) Performed: CATARACT EXTRACTION PHACO AND INTRAOCULAR LENS PLACEMENT (IOC) LEFT (Left Eye)  Patient Location: PACU  Anesthesia Type: MAC  Level of Consciousness: awake, alert  and patient cooperative  Airway and Oxygen Therapy: Patient Spontanous Breathing and Patient connected to supplemental oxygen  Post-op Assessment: Post-op Vital signs reviewed, Patient's Cardiovascular Status Stable, Respiratory Function Stable, Patent Airway and No signs of Nausea or vomiting  Post-op Vital Signs: Reviewed and stable  Complications: No apparent anesthesia complications

## 2019-03-27 NOTE — H&P (Signed)

## 2019-03-27 NOTE — Anesthesia Postprocedure Evaluation (Signed)
Anesthesia Post Note  Patient: Deanna Weaver  Procedure(s) Performed: CATARACT EXTRACTION PHACO AND INTRAOCULAR LENS PLACEMENT (IOC) LEFT (Left Eye)  Patient location during evaluation: PACU Anesthesia Type: MAC Level of consciousness: awake and alert and oriented Pain management: satisfactory to patient Vital Signs Assessment: post-procedure vital signs reviewed and stable Respiratory status: spontaneous breathing, nonlabored ventilation and respiratory function stable Cardiovascular status: blood pressure returned to baseline and stable Postop Assessment: Adequate PO intake and No signs of nausea or vomiting Anesthetic complications: no    Raliegh Ip

## 2019-03-27 NOTE — Op Note (Signed)
OPERATIVE NOTE  Deanna Weaver [id number] 03/27/2019   PREOPERATIVE DIAGNOSIS:  Nuclear sclerotic cataract left eye.  H25.12   POSTOPERATIVE DIAGNOSIS:    Nuclear sclerotic cataract left eye.     PROCEDURE:  Phacoemusification with posterior chamber intraocular lens placement of the left eye   LENS:   Implant Name Type Inv. Item Serial No. Manufacturer Lot No. LRB No. Used Action  LENS IOL DIOP 24.5 - [id number] Intraocular Lens LENS IOL DIOP 24.5 [id number]  Left 1 Implanted       PCB00 +24.5   ULTRASOUND TIME: 0 minutes 30 seconds.  CDE 3.34   SURGEON:  Benay Pillow, MD, MPH   ANESTHESIA:  Topical with tetracaine drops augmented with 1% preservative-free intracameral lidocaine.  ESTIMATED BLOOD LOSS: <1 mL   COMPLICATIONS:  None.   DESCRIPTION OF PROCEDURE:  The patient was identified in the holding room and transported to the operating room and placed in the supine position under the operating microscope.  The left eye was identified as the operative eye and it was prepped and draped in the usual sterile ophthalmic fashion.   A 1.0 millimeter clear-corneal paracentesis was made at the 5:00 position. 0.5 ml of preservative-free 1% lidocaine with epinephrine was injected into the anterior chamber.  The anterior chamber was filled with Healon 5 viscoelastic.  A 2.4 millimeter keratome was used to make a near-clear corneal incision at the 2:00 position.  A curvilinear capsulorrhexis was made with a cystotome and capsulorrhexis forceps.  Balanced salt solution was used to hydrodissect and hydrodelineate the nucleus.   Phacoemulsification was then used in stop and chop fashion to remove the lens nucleus and epinucleus.  The remaining cortex was then removed using the irrigation and aspiration handpiece. Healon was then placed into the capsular bag to distend it for lens placement.  A lens was then injected into the capsular bag.  The remaining viscoelastic was aspirated.   Wounds  were hydrated with balanced salt solution.  The anterior chamber was inflated to a physiologic pressure with balanced salt solution.  Intracameral vigamox 0.1 mL undiltued was injected into the eye and a drop placed onto the ocular surface.  No wound leaks were noted.  The patient was taken to the recovery room in stable condition without complications of anesthesia or surgery  Benay Pillow 03/27/2019, 11:29 AM

## 2019-03-27 NOTE — Anesthesia Procedure Notes (Signed)
Procedure Name: MAC Date/Time: 03/27/2019 11:07 AM Performed by: Cameron Ali, CRNA Pre-anesthesia Checklist: Patient identified, Emergency Drugs available, Suction available, Timeout performed and Patient being monitored Patient Re-evaluated:Patient Re-evaluated prior to induction Oxygen Delivery Method: Nasal cannula Placement Confirmation: positive ETCO2

## 2019-03-28 ENCOUNTER — Encounter: Payer: Self-pay | Admitting: Ophthalmology

## 2019-03-28 NOTE — Progress Notes (Signed)
Subjective:   Deanna Weaver is a 73 y.o. female who presents for Medicare Annual (Subsequent) preventive examination.    This visit is being conducted through telemedicine due to the COVID-19 pandemic. This patient has given me verbal consent via doximity to conduct this visit, patient states they are participating from their home address. Some vital signs may be absent or patient reported.    Patient identification: identified by name, DOB, and current address  Review of Systems:  N/A  Cardiac Risk Factors include: hypertension;advanced age (>22men, >84 women);dyslipidemia     Objective:     Vitals: There were no vitals taken for this visit.  There is no height or weight on file to calculate BMI. Unable to obtain vitals due to visit being conducted via telephonically.   Advanced Directives 03/29/2019 03/27/2019 01/16/2019 10/27/2017 10/23/2016  Does Patient Have a Medical Advance Directive? No No No No No  Would patient like information on creating a medical advance directive? No - Patient declined No - Patient declined Yes (MAU/Ambulatory/Procedural Areas - Information given) No - Patient declined No - Patient declined    Tobacco Social History   Tobacco Use  Smoking Status Never Smoker  Smokeless Tobacco Never Used     Counseling given: Not Answered   Clinical Intake:  Pre-visit preparation completed: Yes  Pain : No/denies pain Pain Score: 0-No pain     Nutritional Risks: None Diabetes: No  How often do you need to have someone help you when you read instructions, pamphlets, or other written materials from your doctor or pharmacy?: 1 - Never  Interpreter Needed?: No  Information entered by :: [facility], LPN  Past Medical History:  Diagnosis Date  . Allergic rhinitis   . Allergy   . Arthritis    lower back  . Hyperlipidemia   . Hypertension    Past Surgical History:  Procedure Laterality Date  . ABDOMINAL HYSTERECTOMY    . CATARACT EXTRACTION W/  INTRAOCULAR LENS IMPLANT Right   . CATARACT EXTRACTION W/PHACO Right 01/16/2019   Procedure: CATARACT EXTRACTION PHACO AND INTRAOCULAR LENS PLACEMENT ([place]) RIGHT;  Surgeon: Eulogio Bear, MD;  Location: [place];  Service: Ophthalmology;  Laterality: Right;  . CATARACT EXTRACTION W/PHACO Left 03/27/2019   Procedure: CATARACT EXTRACTION PHACO AND INTRAOCULAR LENS PLACEMENT ([place]) LEFT;  Surgeon: Eulogio Bear, MD;  Location: [place];  Service: Ophthalmology;  Laterality: Left;  . COLONOSCOPY    . FOOT SURGERY    . VAGINAL HYSTERECTOMY  1990   Family History  Problem Relation Age of Onset  . Cancer Mother   . Heart disease Father    Social History   Socioeconomic History  . Marital status: Married    Spouse name: Not on file  . Number of children: 4  . Years of education: Not on file  . Highest education level: 11th grade  Occupational History  . Occupation: Retired    Comment: keeps Art gallery manager  . Financial resource strain: Not hard at all  . Food insecurity    Worry: Never true    Inability: Never true  . Transportation needs    Medical: No    Non-medical: No  Tobacco Use  . Smoking status: Never Smoker  . Smokeless tobacco: Never Used  Substance and Sexual Activity  . Alcohol use: Never    Frequency: Never  . Drug use: No  . Sexual activity: Not on file  Lifestyle  . Physical activity  Days per week: 0 days    Minutes per session: 0 min  . Stress: Not at all  Relationships  . Social Herbalist on phone: Patient refused    Gets together: Patient refused    Attends religious service: Patient refused    Active member of club or organization: Patient refused    Attends meetings of clubs or organizations: Patient refused    Relationship status: Patient refused  Other Topics Concern  . Not on file  Social History Narrative   ** Merged History Encounter **        Outpatient Encounter Medications as of  03/29/2019  Medication Sig  . fluticasone (FLONASE) 50 MCG/ACT nasal spray Place into the nose. 2(two) nasal each nostril once a day  . latanoprost (XALATAN) 0.005 % ophthalmic solution Place 1 drop into both eyes at bedtime.  Marland Kitchen lisinopril (ZESTRIL) 5 MG tablet Take 1 tablet (5 mg total) by mouth daily.  Marland Kitchen lisinopril (ZESTRIL) 5 MG tablet Take 5 mg by mouth daily.  . montelukast (SINGULAIR) 10 MG tablet TAKE 1 TABLET (10 MG TOTAL) BY MOUTH DAILY. FOR ALLERGIES  . montelukast (SINGULAIR) 10 MG tablet Take 10 mg by mouth at bedtime.  . Multiple Vitamins-Minerals (PRESERVISION AREDS 2 PO) Take by mouth 2 (two) times daily.  . Omega-3 Fatty Acids (FISH OIL) 1000 MG CAPS Take 4 capsules by mouth daily.  . Omega-3 Fatty Acids (FISH OIL) 1000 MG CAPS Take by mouth daily.  . simvastatin (ZOCOR) 40 MG tablet Take 1 tablet (40 mg total) by mouth at bedtime.  . simvastatin (ZOCOR) 40 MG tablet Take 40 mg by mouth daily.  . clotrimazole-betamethasone (LOTRISONE) cream APPLY 1 APPLICATION TOPICALLY 2 (TWO) TIMES DAILY. (Patient not taking: Reported on 03/27/2019)  . latanoprost (XALATAN) 0.005 % ophthalmic solution 1 drop at bedtime.  . Multiple Vitamins-Minerals (PRESERVISION AREDS 2 PO) Take by mouth daily.   No facility-administered encounter medications on file as of 03/29/2019.     Activities of Daily Living In your present state of health, do you have any difficulty performing the following activities: 03/29/2019 03/27/2019  Hearing? N N  Vision? N N  Difficulty concentrating or making decisions? N N  Walking or climbing stairs? N N  Dressing or bathing? N N  Doing errands, shopping? N -  Preparing Food and eating ? N -  Using the Toilet? N -  In the past six months, have you accidently leaked urine? Y -  Comment Occasionally with urges. -  Do you have problems with loss of bowel control? N -  Managing your Medications? N -  Managing your Finances? N -  Housekeeping or managing your  Housekeeping? N -  Some recent data might be hidden    Patient Care Team: Birdie Sons, MD as PCP - General (Family Medicine) Eulogio Bear, MD as Consulting Physician (Ophthalmology)    Assessment:   This is a routine wellness examination for [name].  Exercise Activities and Dietary recommendations Current Exercise Habits: The patient does not participate in regular exercise at present, Exercise limited by: None identified  Goals    . Exercise 3x per week (30 min per time)     Recommend to start walking 3 days a week for 30 minutes at a time. Pt to start this when the weather gets nice.     . Increase water intake     Recommend increasing water intake to 4 glasses a day.  Fall Risk: Fall Risk  03/29/2019 10/27/2017 10/23/2016 07/10/2016 06/14/2015  Falls in the past year? 0 No No No No    FALL RISK PREVENTION PERTAINING TO THE HOME:  Any stairs in or around the home? Yes  If so, are there any without handrails? No   Home free of loose throw rugs in walkways, pet beds, electrical cords, etc? Yes  Adequate lighting in your home to reduce risk of falls? Yes   ASSISTIVE DEVICES UTILIZED TO PREVENT FALLS:  Life alert? No  Use of a cane, walker or w/c? No  Grab bars in the bathroom? No  Shower chair or bench in shower? No  Elevated toilet seat or a handicapped toilet? Yes    TIMED UP AND GO:  Was the test performed? No .    Depression Screen PHQ 2/9 Scores 03/29/2019 10/27/2017 10/23/2016 10/23/2016  PHQ - 2 Score 0 0 0 0  PHQ- 9 Score - - 0 -     Cognitive Function: Declined today.      6CIT Screen 10/23/2016  What Year? 4 points  What month? 0 points  What time? 0 points  Count back from 20 0 points  Months in reverse 0 points  Repeat phrase 2 points  Total Score 6    Immunization History  Administered Date(s) Administered  . Pneumococcal Conjugate-13 11/03/2013  . Pneumococcal Polysaccharide-23 10/23/2016    Qualifies for Shingles Vaccine?  Yes . Due for Shingrix. Education has been provided regarding the importance of this vaccine. Pt has been advised to call insurance company to determine out of pocket expense. Advised may also receive vaccine at local pharmacy or Health Dept. Verbalized acceptance and understanding.  Tdap: Although this vaccine is not a covered service during a Wellness Exam, does the patient still wish to receive this vaccine today?  No .   Flu Vaccine: Due for Flu vaccine. Does the patient want to receive this vaccine today?  No .   Pneumococcal Vaccine: Up to date   Screening Tests Health Maintenance  Topic Date Due  . MAMMOGRAM  12/01/2017  . COLONOSCOPY  10/04/2018  . INFLUENZA VACCINE  11/01/2019 (Originally 03/04/2019)  . TETANUS/TDAP  08/03/2026 (Originally 05/01/1965)  . DEXA SCAN  Completed  . Hepatitis C Screening  Completed  . PNA vac Low Risk Adult  Completed    Cancer Screenings:  Colorectal Screening: Completed 10/03/08. Repeat every 10 years. Referral to GI placed today. Pt aware the office will call re: appt.  Mammogram: Completed 12/02/15. Repeat every yearly. Ordered today. Pt provided with contact info and advised to call to schedule appt. Pt aware the office will call re: appt.  Bone Density: Completed 12/13/17. Results reflect NORMAL. No repeat needed unless indicated by a physician.   Lung Cancer Screening: (Low Dose CT Chest recommended if Age 62-80 years, 30 pack-year currently smoking OR have quit w/in 15years.) does not qualify.   Additional Screening:  Hepatitis C Screening: Up to date  Vision Screening: Recommended annual ophthalmology exams for early detection of glaucoma and other disorders of the eye.  Dental Screening: Recommended annual dental exams for proper oral hygiene  Community Resource Referral:  CRR required this visit?  No       Plan:  I have personally reviewed and addressed the Medicare Annual Wellness questionnaire and have noted the following in the  patient's chart:  A. Medical and social history B. Use of alcohol, tobacco or illicit drugs  C. Current medications and supplements D. Functional  ability and status E.  Nutritional status F.  Physical activity G. Advance directives H. List of other physicians I.  Hospitalizations, surgeries, and ER visits in previous 12 months J.  [place] such as hearing and vision if needed, cognitive and depression L. Referrals and appointments   In addition, I have reviewed and discussed with patient certain preventive protocols, quality metrics, and best practice recommendations. A written personalized care plan for preventive services as well as general preventive health recommendations were provided to patient. Nurse Health Advisor  Signed,    Shalese Strahan [place], [place]  [phone] Nurse Health Advisor   Nurse Notes: Pt declined future orders for a tetanus and influenza vaccines. Mammogram and colonoscopy orders placed today.

## 2019-03-29 ENCOUNTER — Ambulatory Visit (INDEPENDENT_AMBULATORY_CARE_PROVIDER_SITE_OTHER): Payer: Medicare Other

## 2019-03-29 ENCOUNTER — Other Ambulatory Visit: Payer: Self-pay

## 2019-03-29 DIAGNOSIS — Z1231 Encounter for screening mammogram for malignant neoplasm of breast: Secondary | ICD-10-CM

## 2019-03-29 DIAGNOSIS — Z1211 Encounter for screening for malignant neoplasm of colon: Secondary | ICD-10-CM | POA: Diagnosis not present

## 2019-03-29 DIAGNOSIS — Z0000 Encounter for general adult medical examination without abnormal findings: Secondary | ICD-10-CM | POA: Diagnosis not present

## 2019-03-29 NOTE — Patient Instructions (Signed)
[name] , Thank you for taking time to come for your Medicare Wellness Visit. I appreciate your ongoing commitment to your health goals. Please review the following plan we discussed and let me know if I can assist you in the future.   Screening recommendations/referrals: Colonoscopy: Referral to GI placed today. Pt aware the office will call re: appt. Mammogram: Ordered today. Pt provided with contact info and advised to call to schedule appt. Pt aware the office will call re: appt. Bone Density: Up to date. Previous DEXA was normal. No repeat needed.  Recommended yearly dental visit for hygiene and checkup  Vaccinations: Influenza vaccine: Currently due Pneumococcal vaccine: Completed series Tdap vaccine: Pt declines today.  Shingles vaccine: Pt declines today.     Advanced directives: Advance directive discussed with you today. Even though you declined this today please call our office should you change your mind and we can give you the proper paperwork for you to fill out.  Conditions/risks identified: Continue to increase water intake to 6-8 8 oz glasses a day. Recommend to exercise 3 days a week for at least 30 minutes at a time.   Next appointment: 05/30/19 @ 3:00 PM with Dr Caryn Section.    Preventive Care 73 Years and Older, Female Preventive care refers to lifestyle choices and visits with your health care provider that can promote health and wellness. What does preventive care include?  A yearly physical exam. This is also called an annual well check.  Dental exams once or twice a year.  Routine eye exams. Ask your health care provider how often you should have your eyes checked.  Personal lifestyle choices, including:  Daily care of your teeth and gums.  Regular physical activity.  Eating a healthy diet.  Avoiding tobacco and drug use.  Limiting alcohol use.  Practicing safe sex.  Taking low-dose aspirin every day.  Taking vitamin and mineral supplements as  recommended by your health care provider. What happens during an annual well check? The services and screenings done by your health care provider during your annual well check will depend on your age, overall health, lifestyle risk factors, and family history of disease. Counseling  Your health care provider may ask you questions about your:  Alcohol use.  Tobacco use.  Drug use.  Emotional well-being.  Home and relationship well-being.  Sexual activity.  Eating habits.  History of falls.  Memory and ability to understand (cognition).  Work and work Statistician.  Reproductive health. Screening  You may have the following tests or measurements:  Height, weight, and BMI.  Blood pressure.  Lipid and cholesterol levels. These may be checked every 5 years, or more frequently if you are over 26 years old.  Skin check.  Lung cancer screening. You may have this screening every year starting at age 85 if you have a 30-pack-year history of smoking and currently smoke or have quit within the past 15 years.  Fecal occult blood test (FOBT) of the stool. You may have this test every year starting at age 49.  Flexible sigmoidoscopy or colonoscopy. You may have a sigmoidoscopy every 5 years or a colonoscopy every 10 years starting at age 71.  Hepatitis C blood test.  Hepatitis B blood test.  Sexually transmitted disease (STD) testing.  Diabetes screening. This is done by checking your blood sugar (glucose) after you have not eaten for a while (fasting). You may have this done every 1-3 years.  Bone density scan. This is done to screen  for osteoporosis. You may have this done starting at age 38.  Mammogram. This may be done every 1-2 years. Talk to your health care provider about how often you should have regular mammograms. Talk with your health care provider about your test results, treatment options, and if necessary, the need for more tests. Vaccines  Your health care  provider may recommend certain vaccines, such as:  Influenza vaccine. This is recommended every year.  Tetanus, diphtheria, and acellular pertussis (Tdap, Td) vaccine. You may need a Td booster every 10 years.  Zoster vaccine. You may need this after age 26.  Pneumococcal 13-valent conjugate (PCV13) vaccine. One dose is recommended after age 62.  Pneumococcal polysaccharide (PPSV23) vaccine. One dose is recommended after age 50. Talk to your health care provider about which screenings and vaccines you need and how often you need them. This information is not intended to replace advice given to you by your health care provider. Make sure you discuss any questions you have with your health care provider. Document Released: 08/16/2015 Document Revised: 04/08/2016 Document Reviewed: 05/21/2015 Elsevier Interactive Patient Education  2017 [employer] Prevention in the Home Falls can cause injuries. They can happen to people of all ages. There are many things you can do to make your home safe and to help prevent falls. What can I do on the outside of my home?  Regularly fix the edges of walkways and driveways and fix any cracks.  Remove anything that might make you trip as you walk through a door, such as a raised step or threshold.  Trim any bushes or trees on the path to your home.  Use bright outdoor lighting.  Clear any walking paths of anything that might make someone trip, such as rocks or tools.  Regularly check to see if handrails are loose or broken. Make sure that both sides of any steps have handrails.  Any raised decks and porches should have guardrails on the edges.  Have any leaves, snow, or ice cleared regularly.  Use sand or salt on walking paths during winter.  Clean up any spills in your garage right away. This includes oil or grease spills. What can I do in the bathroom?  Use night lights.  Install grab bars by the toilet and in the tub and shower. Do  not use towel bars as grab bars.  Use non-skid mats or decals in the tub or shower.  If you need to sit down in the shower, use a plastic, non-slip stool.  Keep the floor dry. Clean up any water that spills on the floor as soon as it happens.  Remove soap buildup in the tub or shower regularly.  Attach bath mats securely with double-sided non-slip rug tape.  Do not have throw rugs and other things on the floor that can make you trip. What can I do in the bedroom?  Use night lights.  Make sure that you have a light by your bed that is easy to reach.  Do not use any sheets or blankets that are too big for your bed. They should not hang down onto the floor.  Have a firm chair that has side arms. You can use this for support while you get dressed.  Do not have throw rugs and other things on the floor that can make you trip. What can I do in the kitchen?  Clean up any spills right away.  Avoid walking on wet floors.  Keep items that you  use a lot in easy-to-reach places.  If you need to reach something above you, use a strong step stool that has a grab bar.  Keep electrical cords out of the way.  Do not use floor polish or wax that makes floors slippery. If you must use wax, use non-skid floor wax.  Do not have throw rugs and other things on the floor that can make you trip. What can I do with my stairs?  Do not leave any items on the stairs.  Make sure that there are handrails on both sides of the stairs and use them. Fix handrails that are broken or loose. Make sure that handrails are as long as the stairways.  Check any carpeting to make sure that it is firmly attached to the stairs. Fix any carpet that is loose or worn.  Avoid having throw rugs at the top or bottom of the stairs. If you do have throw rugs, attach them to the floor with carpet tape.  Make sure that you have a light switch at the top of the stairs and the bottom of the stairs. If you do not have them,  ask someone to add them for you. What else can I do to help prevent falls?  Wear shoes that:  Do not have high heels.  Have rubber bottoms.  Are comfortable and fit you well.  Are closed at the toe. Do not wear sandals.  If you use a stepladder:  Make sure that it is fully opened. Do not climb a closed stepladder.  Make sure that both sides of the stepladder are locked into place.  Ask someone to hold it for you, if possible.  Clearly mark and make sure that you can see:  Any grab bars or handrails.  First and last steps.  Where the edge of each step is.  Use tools that help you move around (mobility aids) if they are needed. These include:  Canes.  Walkers.  Scooters.  Crutches.  Turn on the lights when you go into a dark area. Replace any light bulbs as soon as they burn out.  Set up your furniture so you have a clear path. Avoid moving your furniture around.  If any of your floors are uneven, fix them.  If there are any pets around you, be aware of where they are.  Review your medicines with your doctor. Some medicines can make you feel dizzy. This can increase your chance of falling. Ask your doctor what other things that you can do to help prevent falls. This information is not intended to replace advice given to you by your health care provider. Make sure you discuss any questions you have with your health care provider. Document Released: 05/16/2009 Document Revised: 12/26/2015 Document Reviewed: 08/24/2014 Elsevier Interactive Patient Education  2017 [employer].

## 2019-03-31 ENCOUNTER — Telehealth: Payer: Self-pay

## 2019-03-31 ENCOUNTER — Other Ambulatory Visit: Payer: Self-pay

## 2019-03-31 DIAGNOSIS — Z1211 Encounter for screening for malignant neoplasm of colon: Secondary | ICD-10-CM

## 2019-03-31 DIAGNOSIS — Z86010 Personal history of colonic polyps: Secondary | ICD-10-CM

## 2019-03-31 MED ORDER — NA SULFATE-K SULFATE-MG SULF 17.5-3.13-1.6 GM/177ML PO SOLN: 1.0000 | Freq: Once | ORAL | 0 refills | Status: AC

## 2019-03-31 NOTE — Telephone Encounter (Signed)
Gastroenterology Pre-Procedure Review  Request Date: 04/24/19 Requesting Physician: Dr. Allen Norris  PATIENT REVIEW QUESTIONS: The patient responded to the following health history questions as indicated:    1. Are you having any GI issues? no 2. Do you have a personal history of Polyps? yes (unsure of year) 3. Do you have a family history of Colon Cancer or Polyps? no 4. Diabetes Mellitus? no 5. Joint replacements in the past 12 months?no 6. Major health problems in the past 3 months?Cataract Surgery August 2020 7. Any artificial heart valves, MVP, or defibrillator?no    MEDICATIONS & ALLERGIES:    Patient reports the following regarding taking any anticoagulation/antiplatelet therapy:   Plavix, Coumadin, Eliquis, Xarelto, Lovenox, Pradaxa, Brilinta, or Effient? no Aspirin? no  Patient confirms/reports the following medications:  Current Outpatient Medications  Medication Sig Dispense Refill  . clotrimazole-betamethasone (LOTRISONE) cream APPLY 1 APPLICATION TOPICALLY 2 (TWO) TIMES DAILY. (Patient not taking: Reported on 03/27/2019) 30 g 5  . fluticasone (FLONASE) 50 MCG/ACT nasal spray Place into the nose. 2(two) nasal each nostril once a day    . latanoprost (XALATAN) 0.005 % ophthalmic solution 1 drop at bedtime.    Marland Kitchen latanoprost (XALATAN) 0.005 % ophthalmic solution Place 1 drop into both eyes at bedtime.    Marland Kitchen lisinopril (ZESTRIL) 5 MG tablet Take 1 tablet (5 mg total) by mouth daily. 90 tablet 3  . lisinopril (ZESTRIL) 5 MG tablet Take 5 mg by mouth daily.    . montelukast (SINGULAIR) 10 MG tablet TAKE 1 TABLET (10 MG TOTAL) BY MOUTH DAILY. FOR ALLERGIES 90 tablet 4  . montelukast (SINGULAIR) 10 MG tablet Take 10 mg by mouth at bedtime.    . Multiple Vitamins-Minerals (PRESERVISION AREDS 2 PO) Take by mouth 2 (two) times daily.    . Multiple Vitamins-Minerals (PRESERVISION AREDS 2 PO) Take by mouth daily.    . Omega-3 Fatty Acids (FISH OIL) 1000 MG CAPS Take 4 capsules by mouth daily.     . Omega-3 Fatty Acids (FISH OIL) 1000 MG CAPS Take by mouth daily.    . simvastatin (ZOCOR) 40 MG tablet Take 1 tablet (40 mg total) by mouth at bedtime. 90 tablet 3  . simvastatin (ZOCOR) 40 MG tablet Take 40 mg by mouth daily.     No current facility-administered medications for this visit.     Patient confirms/reports the following allergies:  Allergies  Allergen Reactions  . Celecoxib Swelling  . Metoprolol     Depression, palpitations.  . Penicillins Rash    No orders of the defined types were placed in this encounter.   AUTHORIZATION INFORMATION Primary Insurance: 1D#: Group #:  Secondary Insurance: 1D#: Group #:  SCHEDULE INFORMATION: Date: 04/24/19 Time: Location:[facility]

## 2019-04-13 ENCOUNTER — Other Ambulatory Visit: Payer: Self-pay

## 2019-04-13 ENCOUNTER — Encounter: Payer: Self-pay | Admitting: *Deleted

## 2019-04-14 NOTE — Anesthesia Preprocedure Evaluation (Addendum)
Anesthesia Evaluation  Patient identified by MRN, date of birth, ID band Patient awake    Reviewed: Allergy & Precautions, NPO status , Patient's Chart, lab work & pertinent test results  History of Anesthesia Complications Negative for: history of anesthetic complications  Airway Mallampati: III  TM Distance: >3 FB Neck ROM: Full    Dental   Pulmonary    breath sounds clear to auscultation       Cardiovascular hypertension, (-) angina(-) DOE  Rhythm:Regular Rate:Normal   HLD   Neuro/Psych  Headaches,    GI/Hepatic neg GERD  ,  Endo/Other    Renal/GU      Musculoskeletal  (+) Arthritis ,   Abdominal   Peds  Hematology   Anesthesia Other Findings   Reproductive/Obstetrics                            Anesthesia Physical Anesthesia Plan  ASA: II  Anesthesia Plan: General   Post-op Pain Management:    Induction: Intravenous  PONV Risk Score and Plan: 3 and Propofol infusion and TIVA  Airway Management Planned: Natural Airway and Nasal Cannula  Additional Equipment:   Intra-op Plan:   Post-operative Plan:   Informed Consent: I have reviewed the patients History and Physical, chart, labs and discussed the procedure including the risks, benefits and alternatives for the proposed anesthesia with the patient or authorized representative who has indicated his/her understanding and acceptance.       Plan Discussed with: CRNA and Anesthesiologist  Anesthesia Plan Comments:         Anesthesia Quick Evaluation   Active Ambulatory Problems    Diagnosis Date Noted  . Allergic rhinitis 04/02/2015  . 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 Hypertension 04/02/2015  . 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 LBP (low back pain) 06/11/2006  . Tremor 04/02/2015  . Varicose veins 04/02/2015  . Dupuytren's contracture of hand 11/04/2017   Resolved Ambulatory Problems    Diagnosis Date Noted  .  Dermatologic disease 12/20/2007  . Infection of the upper respiratory tract 04/02/2015   Past Medical History:  Diagnosis Date  . Allergy   . Arthritis   . Hyperlipidemia     CBC No results found for: WBC, RBC, HGB, HCT, PLT, MCV, MCH, MCHC, RDW, LYMPHSABS, MONOABS, EOSABS, BASOSABS  CMP     Component Value Date/Time   NA 144 11/04/2017 0946   K 4.4 11/04/2017 0946   CL 104 11/04/2017 0946   CO2 23 11/04/2017 0946   GLUCOSE 99 11/04/2017 0946   BUN 16 11/04/2017 0946   CREATININE 0.88 11/04/2017 0946   CALCIUM 9.9 11/04/2017 0946   PROT 7.4 11/04/2017 0946   ALBUMIN 4.7 11/04/2017 0946   AST 22 11/04/2017 0946   ALT 19 11/04/2017 0946   ALKPHOS 64 11/04/2017 0946   BILITOT 0.6 11/04/2017 0946   GFRNONAA 66 11/04/2017 0946   GFRAA 76 11/04/2017 0946    COAGS No results found for: INR, PTT  I have seen and consented the patient, Deanna Weaver. I have answered all of her questions regarding anesthesia. she is appropriately NPO.   Josephina Shih, MD Anesthesia

## 2019-04-20 ENCOUNTER — Other Ambulatory Visit: Payer: Self-pay

## 2019-04-20 ENCOUNTER — Other Ambulatory Visit
Admission: RE | Admit: 2019-04-20 | Discharge: 2019-04-20 | Disposition: A | Discharge status: Home / Self Care | Payer: Medicare Other | Source: Ambulatory Visit | Attending: Gastroenterology | Admitting: Gastroenterology

## 2019-04-20 DIAGNOSIS — Z20828 Contact with and (suspected) exposure to other viral communicable diseases: Secondary | ICD-10-CM | POA: Diagnosis not present

## 2019-04-20 DIAGNOSIS — Z01812 Encounter for preprocedural laboratory examination: Secondary | ICD-10-CM | POA: Diagnosis not present

## 2019-04-20 DIAGNOSIS — K635 Polyp of colon: Secondary | ICD-10-CM | POA: Diagnosis not present

## 2019-04-20 LAB — SARS CORONAVIRUS 2 (TAT 6-24 HRS): SARS Coronavirus 2: NEGATIVE

## 2019-04-21 NOTE — Discharge Instructions (Signed)

## 2019-04-24 ENCOUNTER — Ambulatory Visit
Admission: RE | Admit: 2019-04-24 | Discharge: 2019-04-24 | Disposition: A | Discharge status: Home / Self Care | Payer: Medicare Other | Attending: Gastroenterology | Admitting: Gastroenterology

## 2019-04-24 ENCOUNTER — Ambulatory Visit: Payer: Medicare Other | Admitting: Anesthesiology

## 2019-04-24 ENCOUNTER — Encounter
Admission: RE | Disposition: A | Discharge status: Home / Self Care | Payer: Self-pay | Source: Home / Self Care | Attending: Gastroenterology

## 2019-04-24 DIAGNOSIS — K635 Polyp of colon: Secondary | ICD-10-CM

## 2019-04-24 DIAGNOSIS — Z888 Allergy status to other drugs, medicaments and biological substances status: Secondary | ICD-10-CM | POA: Diagnosis not present

## 2019-04-24 DIAGNOSIS — I10 Essential (primary) hypertension: Secondary | ICD-10-CM | POA: Diagnosis not present

## 2019-04-24 DIAGNOSIS — K5730 Diverticulosis of large intestine without perforation or abscess without bleeding: Secondary | ICD-10-CM | POA: Diagnosis not present

## 2019-04-24 DIAGNOSIS — D125 Benign neoplasm of sigmoid colon: Secondary | ICD-10-CM

## 2019-04-24 DIAGNOSIS — K621 Rectal polyp: Secondary | ICD-10-CM | POA: Insufficient documentation

## 2019-04-24 DIAGNOSIS — D123 Benign neoplasm of transverse colon: Secondary | ICD-10-CM | POA: Insufficient documentation

## 2019-04-24 DIAGNOSIS — J309 Allergic rhinitis, unspecified: Secondary | ICD-10-CM | POA: Insufficient documentation

## 2019-04-24 DIAGNOSIS — Z79899 Other long term (current) drug therapy: Secondary | ICD-10-CM | POA: Diagnosis not present

## 2019-04-24 DIAGNOSIS — D122 Benign neoplasm of ascending colon: Secondary | ICD-10-CM

## 2019-04-24 DIAGNOSIS — Z1211 Encounter for screening for malignant neoplasm of colon: Secondary | ICD-10-CM | POA: Diagnosis not present

## 2019-04-24 DIAGNOSIS — Z8249 Family history of ischemic heart disease and other diseases of the circulatory system: Secondary | ICD-10-CM | POA: Diagnosis not present

## 2019-04-24 DIAGNOSIS — Z880 Allergy status to penicillin: Secondary | ICD-10-CM | POA: Diagnosis not present

## 2019-04-24 DIAGNOSIS — E785 Hyperlipidemia, unspecified: Secondary | ICD-10-CM | POA: Diagnosis not present

## 2019-04-24 DIAGNOSIS — M4696 Unspecified inflammatory spondylopathy, lumbar region: Secondary | ICD-10-CM | POA: Insufficient documentation

## 2019-04-24 DIAGNOSIS — D126 Benign neoplasm of colon, unspecified: Secondary | ICD-10-CM | POA: Diagnosis not present

## 2019-04-24 HISTORY — PX: COLONOSCOPY WITH PROPOFOL: SHX5780

## 2019-04-24 HISTORY — PX: POLYPECTOMY: SHX5525

## 2019-04-24 SURGERY — COLONOSCOPY WITH PROPOFOL
Anesthesia: General

## 2019-04-24 MED ORDER — LIDOCAINE HCL (CARDIAC) PF 100 MG/5ML IV SOSY
PREFILLED_SYRINGE | INTRAVENOUS | Status: DC | PRN
  Administered 2019-04-24: 30 mg via INTRAVENOUS

## 2019-04-24 MED ORDER — LACTATED RINGERS IV SOLN
100.0000 mL/h | INTRAVENOUS | Status: DC
  Administered 2019-04-24: 100 mL/h via INTRAVENOUS

## 2019-04-24 MED ORDER — STERILE WATER FOR IRRIGATION IR SOLN
Status: DC | PRN
  Administered 2019-04-24: 100 mL

## 2019-04-24 MED ORDER — PROPOFOL 10 MG/ML IV BOLUS
INTRAVENOUS | Status: DC | PRN
  Administered 2019-04-24 (×6): 20 mg via INTRAVENOUS
  Administered 2019-04-24: 90 mg via INTRAVENOUS
  Administered 2019-04-24 (×3): 20 mg via INTRAVENOUS

## 2019-04-24 SURGICAL SUPPLY — 24 items

## 2019-04-24 NOTE — Anesthesia Procedure Notes (Signed)
Date/Time: 04/24/2019 10:19 AM Performed by: Cameron Ali, CRNA Pre-anesthesia Checklist: Patient identified, Emergency Drugs available, Suction available, Timeout performed and Patient being monitored Patient Re-evaluated:Patient Re-evaluated prior to induction Oxygen Delivery Method: Nasal cannula Placement Confirmation: positive ETCO2

## 2019-04-24 NOTE — Transfer of Care (Signed)
Immediate Anesthesia Transfer of Care Note  Patient: Deanna Weaver  Procedure(s) Performed: COLONOSCOPY WITH PROPOFOL (N/A ) POLYPECTOMY  Patient Location: PACU  Anesthesia Type: General  Level of Consciousness: awake, alert  and patient cooperative  Airway and Oxygen Therapy: Patient Spontanous Breathing and Patient connected to supplemental oxygen  Post-op Assessment: Post-op Vital signs reviewed, Patient's Cardiovascular Status Stable, Respiratory Function Stable, Patent Airway and No signs of Nausea or vomiting  Post-op Vital Signs: Reviewed and stable  Complications: No apparent anesthesia complications

## 2019-04-24 NOTE — Op Note (Signed)
[facility] Gastroenterology Patient Name: Deanna Weaver Procedure Date: 04/24/2019 10:14 AM MRN: [id number] Account #: [id number] Date of Birth: 07-15-1946 Admit Type: Outpatient Age: 73 Room: [facility] OR ROOM 01 Gender: Female Note Status: Finalized Procedure:            Colonoscopy Indications:          Screening for colorectal malignant neoplasm Providers:            Lucilla Lame MD, MD Referring MD:         Kirstie Peri. Caryn Section, MD (Referring MD) Medicines:            Propofol per Anesthesia Complications:        No immediate complications. Procedure:            Pre-Anesthesia Assessment:                       - Prior to the procedure, a History and Physical was                        performed, and patient medications and allergies were                        reviewed. The patient's tolerance of previous                        anesthesia was also reviewed. The risks and benefits of                        the procedure and the sedation options and risks were                        discussed with the patient. All questions were                        answered, and informed consent was obtained. Prior                        Anticoagulants: The patient has taken no previous                        anticoagulant or antiplatelet agents. ASA Grade                        Assessment: II - A patient with mild systemic disease.                        After reviewing the risks and benefits, the patient was                        deemed in satisfactory condition to undergo the                        procedure.                       After obtaining informed consent, the colonoscope was                        passed under direct vision. Throughout the procedure,  the patient's blood pressure, pulse, and oxygen                        saturations were monitored continuously. The was                        introduced through the anus and advanced to the the                  cecum, identified by appendiceal orifice and ileocecal                        valve. The colonoscopy was performed without                        difficulty. The patient tolerated the procedure well.                        The quality of the bowel preparation was excellent. Findings:      The perianal and digital rectal examinations were normal.      Two sessile polyps were found in the ascending colon. The polyps were 4       to 6 mm in size. These polyps were removed with a cold snare. Resection       and retrieval were complete.      Four sessile polyps were found in the transverse colon. The polyps were       2 to 5 mm in size. These polyps were removed with a cold biopsy forceps.       Resection and retrieval were complete.      Four sessile polyps were found in the sigmoid colon. The polyps were 2       to 4 mm in size. These polyps were removed with a cold biopsy forceps.       Resection and retrieval were complete.      A 12 mm polyp was found in the rectum. The polyp was semi-pedunculated.       Biopsies were taken with a cold forceps for histology.      Multiple small-mouthed diverticula were found in the sigmoid colon. Impression:           - Two 4 to 6 mm polyps in the ascending colon, removed                        with a cold snare. Resected and retrieved.                       - Four 2 to 5 mm polyps in the transverse colon,                        removed with a cold biopsy forceps. Resected and                        retrieved.                       - Four 2 to 4 mm polyps in the sigmoid colon, removed                        with a cold biopsy forceps. Resected and retrieved.                       -  One 12 mm polyp in the rectum. Biopsied.                       - Diverticulosis in the sigmoid colon. Recommendation:       - Discharge patient to home.                       - Resume previous diet.                       - Continue present medications.                        - Await pathology results.                       - If the rectal polyp adenoma then transanal resection                        would be needed. Procedure Code(s):    --- Professional ---                       [phone], Colonoscopy, flexible; with removal of tumor(s),                        polyp(s), or other lesion(s) by snare technique                       45380, 44, Colonoscopy, flexible; with biopsy, single                        or multiple Diagnosis Code(s):    --- Professional ---                       Z12.11, Encounter for screening for malignant neoplasm                        of colon                       K63.5, Polyp of colon                       K62.1, Rectal polyp CPT copyright 2019 [employer]. All rights reserved. The codes documented in this report are preliminary and upon coder review may  be revised to meet current compliance requirements. Lucilla Lame MD, MD 04/24/2019 10:46:51 AM This report has been signed electronically. Number of Addenda: 0 Note Initiated On: 04/24/2019 10:14 AM Scope Withdrawal Time: 0 hours 17 minutes 6 seconds  Total Procedure Duration: 0 hours 20 minutes 27 seconds  Estimated Blood Loss: Estimated blood loss: none.      [facility]

## 2019-04-24 NOTE — H&P (Signed)
Lucilla Lame, MD [address] [place], [place] [address] Phone: [phone] Fax : [phone]  Primary Care Physician:  Birdie Sons, MD Primary Gastroenterologist:  Dr. Allen Norris  Pre-Procedure History & Physical: HPI:  Deanna Weaver is a 73 y.o. female is here for a screening colonoscopy.   Past Medical History:  Diagnosis Date  . Allergic rhinitis   . Allergy   . Arthritis    lower back  . Hyperlipidemia   . Hypertension     Past Surgical History:  Procedure Laterality Date  . ABDOMINAL HYSTERECTOMY    . CATARACT EXTRACTION W/ INTRAOCULAR LENS IMPLANT Right   . CATARACT EXTRACTION W/PHACO Right 01/16/2019   Procedure: CATARACT EXTRACTION PHACO AND INTRAOCULAR LENS PLACEMENT ([place]) RIGHT;  Surgeon: Eulogio Bear, MD;  Location: [facility];  Service: Ophthalmology;  Laterality: Right;  . CATARACT EXTRACTION W/PHACO Left 03/27/2019   Procedure: CATARACT EXTRACTION PHACO AND INTRAOCULAR LENS PLACEMENT ([place]) LEFT;  Surgeon: Eulogio Bear, MD;  Location: [facility];  Service: Ophthalmology;  Laterality: Left;  . COLONOSCOPY    . FOOT SURGERY    . VAGINAL HYSTERECTOMY  1990    Prior to Admission medications   Medication Sig Start Date End Date Taking? Authorizing Provider  clotrimazole-betamethasone (LOTRISONE) cream APPLY 1 APPLICATION TOPICALLY 2 (TWO) TIMES DAILY. 01/24/17  Yes Birdie Sons, MD  fluticasone (FLONASE) 50 MCG/ACT nasal spray Place into the nose. 2(two) nasal each nostril once a day 09/11/14  Yes [provider]  latanoprost (XALATAN) 0.005 % ophthalmic solution Place 1 drop into both eyes at bedtime.   Yes [provider]  lisinopril (ZESTRIL) 5 MG tablet Take 1 tablet (5 mg total) by mouth daily. 12/29/18  Yes Birdie Sons, MD  montelukast (SINGULAIR) 10 MG tablet TAKE 1 TABLET (10 MG TOTAL) BY MOUTH DAILY. FOR ALLERGIES 05/03/18  Yes Birdie Sons, MD  Multiple Vitamins-Minerals (PRESERVISION  AREDS 2 PO) Take by mouth 2 (two) times daily.   Yes [provider]  Omega-3 Fatty Acids (FISH OIL) 1000 MG CAPS Take 4 capsules by mouth daily.   Yes [provider]  simvastatin (ZOCOR) 40 MG tablet Take 1 tablet (40 mg total) by mouth at bedtime. 12/29/18  Yes Birdie Sons, MD    Allergies as of 03/31/2019 - Review Complete 03/29/2019  Allergen Reaction Noted  . Celecoxib Swelling 04/02/2015  . Metoprolol  04/02/2015  . Penicillins Rash 03/20/2019    Family History  Problem Relation Age of Onset  . Cancer Mother   . Heart disease Father     Social History   Socioeconomic History  . Marital status: Married    Spouse name: Not on file  . Number of children: 4  . Years of education: Not on file  . Highest education level: 11th grade  Occupational History  . Occupation: Retired    Comment: keeps Art gallery manager  . Financial resource strain: Not hard at all  . Food insecurity    Worry: Never true    Inability: Never true  . Transportation needs    Medical: No    Non-medical: No  Tobacco Use  . Smoking status: Never Smoker  . Smokeless tobacco: Never Used  Substance and Sexual Activity  . Alcohol use: Never    Frequency: Never  . Drug use: No  . Sexual activity: Not on file  Lifestyle  . Physical activity    Days per week: 0 days  Minutes per session: 0 min  . Stress: Not at all  Relationships  . Social Herbalist on phone: Patient refused    Gets together: Patient refused    Attends religious service: Patient refused    Active member of club or organization: Patient refused    Attends meetings of clubs or organizations: Patient refused    Relationship status: Patient refused  . Intimate partner violence    Fear of current or ex partner: Patient refused    Emotionally abused: Patient refused    Physically abused: Patient refused    Forced sexual activity: Patient refused  Other Topics Concern  . Not on file   Social History Narrative   ** Merged History Encounter **        Review of Systems: See HPI, otherwise negative ROS  Physical Exam: BP 125/86   Pulse 74   Temp (!) 97.5 F (36.4 C) (Temporal)   Resp 16   Ht 5\' 1"  (1.549 m)   Wt 65.8 kg   SpO2 98%   BMI 27.40 kg/m  General:   Alert,  pleasant and cooperative in NAD Head:  Normocephalic and atraumatic. Neck:  Supple; no masses or thyromegaly. Lungs:  Clear throughout to auscultation.    Heart:  Regular rate and rhythm. Abdomen:  Soft, nontender and nondistended. Normal bowel sounds, without guarding, and without rebound.   Neurologic:  Alert and  oriented x4;  grossly normal neurologically.  Impression/Plan: Deanna Weaver is now here to undergo a screening colonoscopy.  Risks, benefits, and alternatives regarding colonoscopy have been reviewed with the patient.  Questions have been answered.  All parties agreeable.

## 2019-04-24 NOTE — Anesthesia Postprocedure Evaluation (Signed)
Anesthesia Post Note  Patient: Deanna Weaver  Procedure(s) Performed: COLONOSCOPY WITH PROPOFOL (N/A ) POLYPECTOMY  Patient location during evaluation: PACU Anesthesia Type: General Level of consciousness: awake and alert Pain management: pain level controlled Vital Signs Assessment: post-procedure vital signs reviewed and stable Respiratory status: spontaneous breathing, nonlabored ventilation, respiratory function stable and patient connected to nasal cannula oxygen Cardiovascular status: blood pressure returned to baseline and stable Postop Assessment: no apparent nausea or vomiting Anesthetic complications: no    Tymia Streb A  Selvin Yun

## 2019-04-25 ENCOUNTER — Encounter: Payer: Self-pay | Admitting: Gastroenterology

## 2019-04-27 ENCOUNTER — Encounter: Payer: Self-pay | Admitting: Gastroenterology

## 2019-05-05 ENCOUNTER — Telehealth: Payer: Self-pay

## 2019-05-05 ENCOUNTER — Other Ambulatory Visit: Payer: Self-pay

## 2019-05-05 NOTE — Telephone Encounter (Signed)
Pt notified of results and added to the recall list for a flex sigmoidoscopy in 3 months.

## 2019-05-05 NOTE — Telephone Encounter (Signed)
-----   Message from Lucilla Lame, MD sent at 05/04/2019 10:20 AM EDT ----- Let the patient know that her polyps were precancerous and that she is doing well and healthy in 5 years she may elect to undergo a repeat colonoscopy.  The large polyp in her rectum that was of concern was benign but that may have been due to sampling error meaning that other parts of the polyp may not be.  I recommended sigmoidoscopy in 3 few months with just enemas for preparation to see if this lesion has gone away or needs more biopsy.

## 2019-05-30 ENCOUNTER — Ambulatory Visit (INDEPENDENT_AMBULATORY_CARE_PROVIDER_SITE_OTHER): Payer: Medicare Other | Admitting: Family Medicine

## 2019-05-30 ENCOUNTER — Encounter: Payer: Self-pay | Admitting: Family Medicine

## 2019-05-30 ENCOUNTER — Other Ambulatory Visit: Payer: Self-pay

## 2019-05-30 VITALS — BP 134/86 | HR 74 | Temp 96.6°F | Ht 61.0 in | Wt 150.6 lb

## 2019-05-30 DIAGNOSIS — R300 Dysuria: Secondary | ICD-10-CM | POA: Diagnosis not present

## 2019-05-30 DIAGNOSIS — E881 Lipodystrophy, not elsewhere classified: Secondary | ICD-10-CM | POA: Diagnosis not present

## 2019-05-30 DIAGNOSIS — I10 Essential (primary) hypertension: Secondary | ICD-10-CM | POA: Diagnosis not present

## 2019-05-30 DIAGNOSIS — Z23 Encounter for immunization: Secondary | ICD-10-CM | POA: Diagnosis not present

## 2019-05-30 DIAGNOSIS — N3001 Acute cystitis with hematuria: Secondary | ICD-10-CM | POA: Diagnosis not present

## 2019-05-30 LAB — POCT URINALYSIS DIPSTICK
Bilirubin, UA: NEGATIVE
Glucose, UA: NEGATIVE
Ketones, UA: NEGATIVE
Nitrite, UA: POSITIVE
Protein, UA: POSITIVE — AB
Spec Grav, UA: 1.02 (ref 1.010–1.025)
Urobilinogen, UA: 0.2 E.U./dL
pH, UA: 6 (ref 5.0–8.0)

## 2019-05-30 MED ORDER — CEFDINIR 300 MG PO CAPS: 300.0000 mg | ORAL_CAPSULE | Freq: Two times a day (BID) | ORAL | 0 refills | Status: DC

## 2019-05-30 NOTE — Progress Notes (Signed)
Patient: Deanna Weaver Female    DOB: 17-Mar-1946   73 y.o.   MRN: [id number] Visit Date: 05/30/2019  Today's Provider: Lelon Huh, MD   Chief Complaint  Patient presents with  . Hypertension  . Hyperlipidemia   Subjective:     Dysuria  This is a new problem. Episode onset: Saturday. The problem occurs every urination. The problem has been gradually worsening. The quality of the pain is described as burning. There has been no fever. Pertinent negatives include no discharge, flank pain, frequency, hematuria, hesitancy or urgency. Treatments tried: Monistat. The treatment provided mild relief.   Patient saw [facility] for AWV on 03/29/2019.  HPI   Hypertension, follow-up:  BP Readings from Last 3 Encounters:  05/30/19 134/86  04/24/19 107/64  03/27/19 115/66   She was last seen for hypertension 1 years ago.  BP at that visit was 18/88 on 11/04/2017 Management since that visit includes; no changes. She reports good compliance with treatment. She is not having side effects. none She is exercising. She is adherent to low salt diet.   Outside blood pressures are not checking. She is experiencing none.  Patient denies none.   Cardiovascular risk factors include advanced age (older than 23 for men, 56 for women).  Use of agents associated with hypertension: none.   ----------------------------------------------------------------   Lipid/Cholesterol, Follow-up:   Last seen for this 1 years ago.  Management since that visit includes; no changes.  Last Lipid Panel: Lab Results  Component Value Date   CHOL 129 11/04/2017   HDL 32 (L) 11/04/2017   LDLCALC 72 11/04/2017   TRIG 127 11/04/2017   CHOLHDL 4.0 11/04/2017   She reports good compliance with treatment. She is not having side effects. none  Wt Readings from Last 3 Encounters:  05/30/19 150 lb 9.6 oz (68.3 kg)  04/24/19 145 lb (65.8 kg)  03/27/19 148 lb (67.1 kg)     ----------------------------------------------------------------  Allergies  Allergen Reactions  . Celecoxib Swelling  . Metoprolol     Depression, palpitations.  . Penicillins Rash     Current Outpatient Medications:  .  clotrimazole-betamethasone (LOTRISONE) cream, APPLY 1 APPLICATION TOPICALLY 2 (TWO) TIMES DAILY., Disp: 30 g, Rfl: 5 .  fluticasone (FLONASE) 50 MCG/ACT nasal spray, Place into the nose. 2(two) nasal each nostril once a day, Disp: , Rfl:  .  latanoprost (XALATAN) 0.005 % ophthalmic solution, Place 1 drop into both eyes at bedtime., Disp: , Rfl:  .  lisinopril (ZESTRIL) 5 MG tablet, Take 1 tablet (5 mg total) by mouth daily., Disp: 90 tablet, Rfl: 3 .  montelukast (SINGULAIR) 10 MG tablet, TAKE 1 TABLET (10 MG TOTAL) BY MOUTH DAILY. FOR ALLERGIES, Disp: 90 tablet, Rfl: 4 .  Multiple Vitamins-Minerals (PRESERVISION AREDS 2 PO), Take by mouth 2 (two) times daily., Disp: , Rfl:  .  Omega-3 Fatty Acids (FISH OIL) 1000 MG CAPS, Take 4 capsules by mouth daily., Disp: , Rfl:  .  simvastatin (ZOCOR) 40 MG tablet, Take 1 tablet (40 mg total) by mouth at bedtime., Disp: 90 tablet, Rfl: 3  Review of Systems  Constitutional: Negative.   Respiratory: Negative.   Cardiovascular: Negative.   Genitourinary: Positive for dysuria. Negative for flank pain, frequency, hematuria, hesitancy and urgency.  Musculoskeletal: Negative.     Social History   Tobacco Use  . Smoking status: Never Smoker  . Smokeless tobacco: Never Used  Substance Use Topics  . Alcohol use: Never    Frequency:  Never      Objective:   BP 134/86 (BP Location: Right Arm, Patient Position: Sitting, Cuff Size: Normal)   Pulse 74   Temp (!) 96.6 F (35.9 C) (Temporal)   Ht 5\' 1"  (1.549 m)   Wt 150 lb 9.6 oz (68.3 kg)   BMI 28.46 kg/m  Vitals:   05/30/19 1441  BP: 134/86  Pulse: 74  Temp: (!) 96.6 F (35.9 C)  TempSrc: Temporal  Weight: 150 lb 9.6 oz (68.3 kg)  Height: 5\' 1"  (1.549 m)  Body mass  index is 28.46 kg/m.   Physical Exam   General Appearance:    Well developed, well nourished female in no acute distress  Eyes:    PERRL, conjunctiva/corneas clear, EOM's intact       Lungs:     Clear to auscultation bilaterally, respirations unlabored  Heart:    Normal heart rate. Normal rhythm. No murmurs, rubs, or gallops.   MS:   All extremities are intact.   Neurologic:   Awake, alert, oriented x 3. No apparent focal neurological           defect.            Assessment & Plan     1. Dysuria  - Urine Culture  2. Acute cystitis with hematuria  - cefdinir (OMNICEF) 300 MG capsule; Take 1 capsule (300 mg total) by mouth 2 (two) times daily for 7 days.  Dispense: 14 capsule; Refill: 0  3. Essential hypertension Well controlled.  Continue current medications.    4. Lipodystrophy She is tolerating simvastatin well with no adverse effects.   - Comprehensive metabolic panel - Lipid panel  5. Need for influenza vaccination She is hesitant to get high dose flu vaccine due to increase risk of adverse effects. Given standard dose today.  - Flu Vaccine QUAD 6+ mos PF IM (Fluarix Quad PF)     Lelon Huh, MD  [facility]

## 2019-05-30 NOTE — Patient Instructions (Addendum)
.   Please review the attached list of medications and notify my office if there are any errors.   . Please bring all of your medications to every appointment so we can make sure that our medication list is the same as yours.   . Please call the [facility] ([phone]) to schedule a routine screening mammogram.  

## 2019-05-31 ENCOUNTER — Encounter: Payer: Self-pay | Admitting: Family Medicine

## 2019-05-31 ENCOUNTER — Telehealth: Payer: Self-pay

## 2019-05-31 LAB — COMPREHENSIVE METABOLIC PANEL
ALT: 16 IU/L (ref 0–32)
AST: 21 IU/L (ref 0–40)
Albumin/Globulin Ratio: 1.8 (ref 1.2–2.2)
Albumin: 4.9 g/dL — ABNORMAL HIGH (ref 3.7–4.7)
Alkaline Phosphatase: 85 IU/L (ref 39–117)
BUN/Creatinine Ratio: 14 (ref 12–28)
BUN: 12 mg/dL (ref 8–27)
Bilirubin Total: 0.4 mg/dL (ref 0.0–1.2)
CO2: 27 mmol/L (ref 20–29)
Calcium: 10.1 mg/dL (ref 8.7–10.3)
Chloride: 100 mmol/L (ref 96–106)
Creatinine, Ser: 0.85 mg/dL (ref 0.57–1.00)
GFR calc Af Amer: 79 mL/min/{1.73_m2} (ref >59)
GFR calc non Af Amer: 68 mL/min/{1.73_m2} (ref >59)
Globulin, Total: 2.7 g/dL (ref 1.5–4.5)
Glucose: 92 mg/dL (ref 65–99)
Potassium: 4.3 mmol/L (ref 3.5–5.2)
Sodium: 140 mmol/L (ref 134–144)
Total Protein: 7.6 g/dL (ref 6.0–8.5)

## 2019-05-31 LAB — LIPID PANEL
Chol/HDL Ratio: 4.8 ratio — ABNORMAL HIGH (ref 0.0–4.4)
Cholesterol, Total: 143 mg/dL (ref 100–199)
HDL: 30 mg/dL — ABNORMAL LOW (ref >39)
LDL Chol Calc (NIH): 71 mg/dL (ref 0–99)
Triglycerides: 255 mg/dL — ABNORMAL HIGH (ref 0–149)
VLDL Cholesterol Cal: 42 mg/dL — ABNORMAL HIGH (ref 5–40)

## 2019-05-31 NOTE — Telephone Encounter (Signed)
-----   Message from Birdie Sons, MD sent at 05/31/2019  7:50 AM EDT ----- Cholesterol is good at 143. Triglycerides are a little high. Need to avoid sweets and starchy foods. Continue current medications.  Check yearly.

## 2019-05-31 NOTE — Telephone Encounter (Signed)
Pt returned missed call.  Please call pt back for results asap.  Thanks, [employer]

## 2019-05-31 NOTE — Telephone Encounter (Signed)
Patient advised.

## 2019-05-31 NOTE — Telephone Encounter (Signed)
LMTCB

## 2019-06-03 LAB — URINE CULTURE

## 2019-06-05 ENCOUNTER — Telehealth: Payer: Self-pay

## 2019-06-05 NOTE — Telephone Encounter (Signed)
-----   Message from Birdie Sons, MD sent at 06/03/2019  5:40 PM EDT ----- Urine culture shows infection sensitive to antibiotic that was prescribed. Symptoms should completely resolve by the time antibiotic is finished. Call back otherwise.

## 2019-06-05 NOTE — Telephone Encounter (Signed)
LMTCB

## 2019-06-06 NOTE — Telephone Encounter (Signed)
Patient advised as below. Patient reports she is feeling much better.

## 2019-07-12 ENCOUNTER — Other Ambulatory Visit: Payer: Self-pay | Admitting: Family Medicine

## 2019-07-12 DIAGNOSIS — J309 Allergic rhinitis, unspecified: Secondary | ICD-10-CM

## 2019-07-12 MED ORDER — FLUTICASONE PROPIONATE 50 MCG/ACT NA SUSP: 2.0000 | Freq: Every day | NASAL | 5 refills | Status: DC

## 2019-07-12 NOTE — Telephone Encounter (Signed)
[facility] faxed refill request for the following medications:  fluticasone (FLONASE) 50 MCG/ACT nasal spray  Please advise.   Thanks, [facility] 

## 2019-08-14 DIAGNOSIS — R208 Other disturbances of skin sensation: Secondary | ICD-10-CM | POA: Diagnosis not present

## 2019-08-14 DIAGNOSIS — L538 Other specified erythematous conditions: Secondary | ICD-10-CM | POA: Diagnosis not present

## 2019-08-14 DIAGNOSIS — B353 Tinea pedis: Secondary | ICD-10-CM | POA: Diagnosis not present

## 2019-08-14 DIAGNOSIS — L570 Actinic keratosis: Secondary | ICD-10-CM | POA: Diagnosis not present

## 2019-08-14 DIAGNOSIS — R202 Paresthesia of skin: Secondary | ICD-10-CM | POA: Diagnosis not present

## 2019-08-14 DIAGNOSIS — L820 Inflamed seborrheic keratosis: Secondary | ICD-10-CM | POA: Diagnosis not present

## 2019-08-14 DIAGNOSIS — L814 Other melanin hyperpigmentation: Secondary | ICD-10-CM | POA: Diagnosis not present

## 2019-08-28 ENCOUNTER — Telehealth: Payer: Self-pay

## 2019-08-28 NOTE — Telephone Encounter (Signed)
-----   Message from [place], [place] sent at 05/05/2019  9:56 AM EDT ----- PT NEEDS FLEX SIG TO EVALUATE LARGE RECTAL POLYP PER WOHL. PT IS ANXIOUS AND WILL BE WAITING FOR YOUR CALL.

## 2019-08-28 NOTE — Telephone Encounter (Signed)
LVM for pt to return my call to schedule a flex sigmoidoscopy.

## 2019-09-05 ENCOUNTER — Other Ambulatory Visit: Payer: Self-pay

## 2019-09-05 DIAGNOSIS — K621 Rectal polyp: Secondary | ICD-10-CM

## 2019-09-05 NOTE — Telephone Encounter (Signed)
Called pt back today and scheduled her for a flex sigmoidoscopy on 10/09/19. Paperwork mailed.

## 2019-09-21 NOTE — Progress Notes (Signed)
error 

## 2019-09-28 DIAGNOSIS — H40003 Preglaucoma, unspecified, bilateral: Secondary | ICD-10-CM | POA: Diagnosis not present

## 2019-10-03 ENCOUNTER — Other Ambulatory Visit: Payer: Self-pay

## 2019-10-03 ENCOUNTER — Encounter: Payer: Self-pay | Admitting: Gastroenterology

## 2019-10-05 ENCOUNTER — Other Ambulatory Visit
Admission: RE | Admit: 2019-10-05 | Discharge: 2019-10-05 | Disposition: A | Discharge status: Home / Self Care | Payer: Medicare Other | Source: Ambulatory Visit | Attending: Gastroenterology | Admitting: Gastroenterology

## 2019-10-05 DIAGNOSIS — Z20822 Contact with and (suspected) exposure to covid-19: Secondary | ICD-10-CM | POA: Diagnosis not present

## 2019-10-05 DIAGNOSIS — H401131 Primary open-angle glaucoma, bilateral, mild stage: Secondary | ICD-10-CM | POA: Diagnosis not present

## 2019-10-05 DIAGNOSIS — Z01812 Encounter for preprocedural laboratory examination: Secondary | ICD-10-CM | POA: Insufficient documentation

## 2019-10-05 LAB — SARS CORONAVIRUS 2 (TAT 6-24 HRS): SARS Coronavirus 2: NEGATIVE

## 2019-10-06 NOTE — Discharge Instructions (Signed)
General Anesthesia, Adult, Care After This sheet gives you information about how to care for yourself after your procedure. Your health care provider may also give you more specific instructions. If you have problems or questions, contact your health care provider. What can I expect after the procedure? After the procedure, the following side effects are common:  Pain or discomfort at the IV site.  Nausea.  Vomiting.  Sore throat.  Trouble concentrating.  Feeling cold or chills.  Weak or tired.  Sleepiness and fatigue.  Soreness and body aches. These side effects can affect parts of the body that were not involved in surgery. Follow these instructions at home:  For at least 24 hours after the procedure:  Have a responsible adult stay with you. It is important to have someone help care for you until you are awake and alert.  Rest as needed.  Do not: ? Participate in activities in which you could fall or become injured. ? Drive. ? Use heavy machinery. ? Drink alcohol. ? Take sleeping pills or medicines that cause drowsiness. ? Make important decisions or sign legal documents. ? Take care of children on your own. Eating and drinking  Follow any instructions from your health care provider about eating or drinking restrictions.  When you feel hungry, start by eating small amounts of foods that are soft and easy to digest (bland), such as toast. Gradually return to your regular diet.  Drink enough fluid to keep your urine pale yellow.  If you vomit, rehydrate by drinking water, juice, or clear broth. General instructions  If you have sleep apnea, surgery and certain medicines can increase your risk for breathing problems. Follow instructions from your health care provider about wearing your sleep device: ? Anytime you are sleeping, including during daytime naps. ? While taking prescription pain medicines, sleeping medicines, or medicines that make you drowsy.  Return to  your normal activities as told by your health care provider. Ask your health care provider what activities are safe for you.  Take over-the-counter and prescription medicines only as told by your health care provider.  If you smoke, do not smoke without supervision.  Keep all follow-up visits as told by your health care provider. This is important. Contact a health care provider if:  You have nausea or vomiting that does not get better with medicine.  You cannot eat or drink without vomiting.  You have pain that does not get better with medicine.  You are unable to pass urine.  You develop a skin rash.  You have a fever.  You have redness around your IV site that gets worse. Get help right away if:  You have difficulty breathing.  You have chest pain.  You have blood in your urine or stool, or you vomit blood. Summary  After the procedure, it is common to have a sore throat or nausea. It is also common to feel tired.  Have a responsible adult stay with you for the first 24 hours after general anesthesia. It is important to have someone help care for you until you are awake and alert.  When you feel hungry, start by eating small amounts of foods that are soft and easy to digest (bland), such as toast. Gradually return to your regular diet.  Drink enough fluid to keep your urine pale yellow.  Return to your normal activities as told by your health care provider. Ask your health care provider what activities are safe for you. This information is not   intended to replace advice given to you by your health care provider. Make sure you discuss any questions you have with your health care provider. Document Revised: 07/23/2017 Document Reviewed: 03/05/2017 Elsevier Patient Education  2020 Elsevier Inc.  

## 2019-10-09 ENCOUNTER — Other Ambulatory Visit: Payer: Self-pay | Admitting: Family Medicine

## 2019-10-09 ENCOUNTER — Ambulatory Visit
Admission: RE | Admit: 2019-10-09 | Discharge: 2019-10-09 | Disposition: A | Discharge status: Home / Self Care | Payer: Medicare Other | Attending: Gastroenterology | Admitting: Gastroenterology

## 2019-10-09 ENCOUNTER — Ambulatory Visit: Payer: Medicare Other | Admitting: Anesthesiology

## 2019-10-09 ENCOUNTER — Encounter: Payer: Self-pay | Admitting: Gastroenterology

## 2019-10-09 ENCOUNTER — Encounter
Admission: RE | Disposition: A | Discharge status: Home / Self Care | Payer: Self-pay | Source: Home / Self Care | Attending: Gastroenterology

## 2019-10-09 DIAGNOSIS — I10 Essential (primary) hypertension: Secondary | ICD-10-CM | POA: Insufficient documentation

## 2019-10-09 DIAGNOSIS — J309 Allergic rhinitis, unspecified: Secondary | ICD-10-CM | POA: Insufficient documentation

## 2019-10-09 DIAGNOSIS — Z79899 Other long term (current) drug therapy: Secondary | ICD-10-CM | POA: Diagnosis not present

## 2019-10-09 DIAGNOSIS — Z86010 Personal history of colonic polyps: Secondary | ICD-10-CM | POA: Diagnosis not present

## 2019-10-09 DIAGNOSIS — K621 Rectal polyp: Secondary | ICD-10-CM

## 2019-10-09 DIAGNOSIS — E785 Hyperlipidemia, unspecified: Secondary | ICD-10-CM | POA: Insufficient documentation

## 2019-10-09 DIAGNOSIS — Z860101 Personal history of adenomatous and serrated colon polyps: Secondary | ICD-10-CM

## 2019-10-09 HISTORY — PX: POLYPECTOMY: SHX5525

## 2019-10-09 HISTORY — PX: FLEXIBLE SIGMOIDOSCOPY: SHX5431

## 2019-10-09 SURGERY — SIGMOIDOSCOPY, FLEXIBLE
Anesthesia: General | Site: Rectum

## 2019-10-09 MED ORDER — LACTATED RINGERS IV SOLN: INTRAVENOUS | Status: DC

## 2019-10-09 SURGICAL SUPPLY — 6 items
CANISTER SUCT 1200ML W/VALVE (MISCELLANEOUS) ×2 IMPLANT
FORCEPS BIOP RAD 4 LRG CAP 4 (CUTTING FORCEPS) ×2 IMPLANT
GOWN CVR UNV OPN BCK APRN NK (MISCELLANEOUS) ×2 IMPLANT
GOWN ISOL THUMB LOOP REG UNIV (MISCELLANEOUS) ×2
KIT ENDO PROCEDURE OLY (KITS) ×2 IMPLANT
WATER STERILE IRR 250ML POUR (IV SOLUTION) ×2 IMPLANT

## 2019-10-09 NOTE — Op Note (Signed)
[facility] Gastroenterology Patient Name: Deanna Weaver Procedure Date: 10/09/2019 7:32 AM MRN: [id number] Account #: [id number] Date of Birth: Apr 24, 1946 Admit Type: Outpatient Age: 74 Room: [facility] OR ROOM 01 Gender: Female Note Status: Finalized Procedure:             Flexible Sigmoidoscopy Indications:           Personal history of colonic polyps Providers:             Lucilla Lame MD, MD Referring MD:          Kirstie Peri. Caryn Section, MD (Referring MD) Medicines:             None Complications:         No immediate complications. Procedure:             Pre-Anesthesia Assessment:                        - Prior to the procedure, a History and Physical was                         performed, and patient medications and allergies were                         reviewed. The patient's tolerance of previous                         anesthesia was also reviewed. The risks and benefits                         of the procedure and the sedation options and risks                         were discussed with the patient. All questions were                         answered, and informed consent was obtained. Prior                         Anticoagulants: The patient has taken no previous                         anticoagulant or antiplatelet agents. ASA Grade                         Assessment: II - A patient with mild systemic disease.                         After reviewing the risks and benefits, the patient                         was deemed in satisfactory condition to undergo the                         procedure.                        After obtaining informed consent, the scope was passed  under direct vision. The Endosonoscope was introduced                         through the anus and advanced to the the sigmoid                         colon. The flexible sigmoidoscopy was accomplished                         without difficulty. The patient tolerated the                       procedure well. The quality of the bowel preparation                         was excellent. Findings:      The perianal and digital rectal examinations were normal.      A 8 mm polyp was found in the rectum. The polyp was sessile. Biopsies       were taken with a cold forceps for histology. Impression:            - One 8 mm polyp in the rectum. Biopsied. Recommendation:        - Discharge patient to home. Procedure Code(s):     --- Professional ---                        [id number], Sigmoidoscopy, flexible; with biopsy, single or                         multiple Diagnosis Code(s):     --- Professional ---                        Z86.010, Personal history of colonic polyps                        K62.1, Rectal polyp CPT copyright 2019 American Medical Association. All rights reserved. The codes documented in this report are preliminary and upon coder review may  be revised to meet current compliance requirements. Lucilla Lame MD, MD 10/09/2019 7:50:19 AM This report has been signed electronically. Number of Addenda: 0 Note Initiated On: 10/09/2019 7:32 AM Total Procedure Duration: 0 hours 3 minutes 12 seconds  Estimated Blood Loss:  Estimated blood loss: none.      [facility]

## 2019-10-09 NOTE — H&P (Signed)
Lucilla Lame, MD [address] [place], [place] [address] [phone] Fax : [phone]  Primary Care Physician:  Birdie Sons, MD Primary Gastroenterologist:  Dr. Allen Norris  Pre-Procedure History & Physical: HPI:  Deanna Weaver is a 74 y.o. female is here for an flexible sigmoidoscopy.   Past Medical History:  Diagnosis Date  . Allergic rhinitis   . Allergy   . Arthritis    lower back  . Hyperlipidemia   . Hypertension     Past Surgical History:  Procedure Laterality Date  . ABDOMINAL HYSTERECTOMY    . CATARACT EXTRACTION W/ INTRAOCULAR LENS IMPLANT Right   . CATARACT EXTRACTION W/PHACO Right 01/16/2019   Procedure: CATARACT EXTRACTION PHACO AND INTRAOCULAR LENS PLACEMENT ([facility]) RIGHT;  Surgeon: Eulogio Bear, MD;  Location: [facility];  Service: Ophthalmology;  Laterality: Right;  . CATARACT EXTRACTION W/PHACO Left 03/27/2019   Procedure: CATARACT EXTRACTION PHACO AND INTRAOCULAR LENS PLACEMENT ([facility]) LEFT;  Surgeon: Eulogio Bear, MD;  Location: [facility];  Service: Ophthalmology;  Laterality: Left;  . COLONOSCOPY    . COLONOSCOPY WITH PROPOFOL N/A 04/24/2019   Procedure: COLONOSCOPY WITH PROPOFOL;  Surgeon: Lucilla Lame, MD;  Location: [facility];  Service: Endoscopy;  Laterality: N/A;  . FOOT SURGERY    . POLYPECTOMY  04/24/2019   Procedure: POLYPECTOMY;  Surgeon: Lucilla Lame, MD;  Location: [facility];  Service: Endoscopy;;  . VAGINAL HYSTERECTOMY  1990    Prior to Admission medications   Medication Sig Start Date End Date Taking? Authorizing Provider  clotrimazole-betamethasone (LOTRISONE) cream APPLY 1 APPLICATION TOPICALLY 2 (TWO) TIMES DAILY. 01/24/17  Yes Birdie Sons, MD  fluticasone (FLONASE) 50 MCG/ACT nasal spray Place 2 sprays into both nostrils daily. 2(two) nasal each nostril once a day 07/12/19  Yes Fisher, Kirstie Peri, MD  latanoprost (XALATAN) 0.005 % ophthalmic solution Place 1 drop into  both eyes at bedtime.   Yes [provider]  lisinopril (ZESTRIL) 5 MG tablet Take 1 tablet (5 mg total) by mouth daily. 12/29/18  Yes Birdie Sons, MD  montelukast (SINGULAIR) 10 MG tablet TAKE 1 TABLET (10 MG TOTAL) BY MOUTH DAILY. FOR ALLERGIES 07/12/19  Yes Birdie Sons, MD  Multiple Vitamins-Minerals (PRESERVISION AREDS 2 PO) Take by mouth 2 (two) times daily.   Yes [provider]  Omega-3 Fatty Acids (FISH OIL) 1000 MG CAPS Take 4 capsules by mouth daily.   Yes [provider]  simvastatin (ZOCOR) 40 MG tablet Take 1 tablet (40 mg total) by mouth at bedtime. 12/29/18  Yes Birdie Sons, MD    Allergies as of 09/05/2019 - Review Complete 05/30/2019  Allergen Reaction Noted  . Celecoxib Swelling 04/02/2015  . Metoprolol  04/02/2015  . Penicillins Rash 03/20/2019    Family History  Problem Relation Age of Onset  . Cancer Mother   . Heart disease Father     Social History   Socioeconomic History  . Marital status: Married    Spouse name: Not on file  . Number of children: 4  . Years of education: Not on file  . Highest education level: 11th grade  Occupational History  . Occupation: Retired    Comment: keeps granchildren  Tobacco Use  . Smoking status: Never Smoker  . Smokeless tobacco: Never Used  Substance and Sexual Activity  . Alcohol use: Never  . Drug use: No  . Sexual activity: Not on file  Other Topics Concern  . Not  on file  Social History Narrative   ** Merged History Encounter **       Social Determinants of Health   Financial Resource Strain:   . Difficulty of Paying Living Expenses: Not on file  Food Insecurity:   . Worried About Charity fundraiser in the Last Year: Not on file  . Ran Out of Food in the Last Year: Not on file  Transportation Needs:   . Lack of Transportation (Medical): Not on file  . Lack of Transportation (Non-Medical): Not on file  Physical Activity: Inactive  . Days of Exercise per Week: 0  days  . Minutes of Exercise per Session: 0 min  Stress: No Stress Concern Present  . Feeling of Stress : Not at all  Social Connections: Unknown  . Frequency of Communication with Friends and Family: Patient refused  . Frequency of Social Gatherings with Friends and Family: Patient refused  . Attends Religious Services: Patient refused  . Active Member of Clubs or Organizations: Patient refused  . Attends Archivist Meetings: Patient refused  . Marital Status: Patient refused  Intimate Partner Violence: Unknown  . Fear of Current or Ex-Partner: Patient refused  . Emotionally Abused: Patient refused  . Physically Abused: Patient refused  . Sexually Abused: Patient refused    Review of Systems: See HPI, otherwise negative ROS  Physical Exam: BP (!) 141/64   Pulse 68   Temp (!) 97.2 F (36.2 C) (Temporal)   Ht 5\' 1"  (1.549 m)   Wt 66.2 kg   SpO2 100%   BMI 27.59 kg/m  General:   Alert,  pleasant and cooperative in NAD Head:  Normocephalic and atraumatic. Neck:  Supple; no masses or thyromegaly. Lungs:  Clear throughout to auscultation.    Heart:  Regular rate and rhythm. Abdomen:  Soft, nontender and nondistended. Normal bowel sounds, without guarding, and without rebound.   Neurologic:  Alert and  oriented x4;  grossly normal neurologically.  Impression/Plan: [name] is here for an flexible sigmoidoscopy to be performed for rectal polyp  Risks, benefits, limitations, and alternatives regarding  flexible sigmoidoscopy have been reviewed with the patient.  Questions have been answered.  All parties agreeable.   Lucilla Lame, MD  10/09/2019, 7:32 AM

## 2019-10-09 NOTE — OR Nursing (Signed)
Vitals: 0742 O2 Sat 99% P 67 BP 127/65  0745 O2 Sat 100% P 67 BP 152/78

## 2019-10-09 NOTE — Anesthesia Preprocedure Evaluation (Signed)
Anesthesia Evaluation  Patient identified by MRN, date of birth, ID band Patient awake    Airway Mallampati: II  TM Distance: >3 FB Neck ROM: Full    Dental   Pulmonary    Pulmonary exam normal        Cardiovascular hypertension, Normal cardiovascular exam     Neuro/Psych  Headaches,    GI/Hepatic   Endo/Other    Renal/GU      Musculoskeletal  (+) Arthritis ,   Abdominal   Peds  Hematology   Anesthesia Other Findings   Reproductive/Obstetrics                             Anesthesia Physical Anesthesia Plan  ASA: II  Anesthesia Plan: General   Post-op Pain Management:    Induction: Intravenous  PONV Risk Score and Plan:   Airway Management Planned: Natural Airway  Additional Equipment:   Intra-op Plan:   Post-operative Plan:   Informed Consent: I have reviewed the patients History and Physical, chart, labs and discussed the procedure including the risks, benefits and alternatives for the proposed anesthesia with the patient or authorized representative who has indicated his/her understanding and acceptance.       Plan Discussed with: CRNA, Anesthesiologist and Surgeon  Anesthesia Plan Comments:         Anesthesia Quick Evaluation

## 2019-10-10 ENCOUNTER — Encounter: Payer: Self-pay | Admitting: *Deleted

## 2019-10-10 LAB — SURGICAL PATHOLOGY

## 2019-10-11 ENCOUNTER — Telehealth: Payer: Self-pay

## 2019-10-11 NOTE — Telephone Encounter (Signed)
Pt notified of procedure results. Pt added to recall list.

## 2019-10-11 NOTE — Telephone Encounter (Signed)
-----   Message from Lucilla Lame, MD sent at 10/11/2019  8:21 AM EST ----- Let the patient know that the polyp was completely benign and she needs a repeat colonoscopy in 5 years.

## 2019-10-19 ENCOUNTER — Ambulatory Visit
Admission: RE | Admit: 2019-10-19 | Discharge: 2019-10-19 | Disposition: A | Discharge status: Home / Self Care | Payer: Medicare Other | Source: Ambulatory Visit | Attending: Family Medicine | Admitting: Family Medicine

## 2019-10-19 DIAGNOSIS — Z1231 Encounter for screening mammogram for malignant neoplasm of breast: Secondary | ICD-10-CM | POA: Diagnosis not present

## 2019-12-24 ENCOUNTER — Other Ambulatory Visit: Payer: Self-pay | Admitting: Family Medicine

## 2019-12-24 NOTE — Telephone Encounter (Signed)
Requested medication (s) are due for refill today: yes  Requested medication (s) are on the active medication list: yes  Last refill:  12/29/19  Future visit scheduled: no  Notes to clinic:  called pt and LM on VM to call office for appt.  Requested Prescriptions  Pending Prescriptions Disp Refills   simvastatin (ZOCOR) 40 MG tablet [Pharmacy Med Name: SIMVASTATIN 40 MG TABLET] 90 tablet 3    Sig: TAKE 1 TABLET BY MOUTH EVERYDAY AT BEDTIME      Cardiovascular:  Antilipid - Statins Failed - 12/24/2019  9:43 AM      Failed - LDL in normal range and within 360 days    LDL Chol Calc (NIH)  Date Value Ref Range Status  05/30/2019 71 0 - 99 mg/dL Final          Failed - HDL in normal range and within 360 days    HDL  Date Value Ref Range Status  05/30/2019 30 (L) >39 mg/dL Final          Failed - Triglycerides in normal range and within 360 days    Triglycerides  Date Value Ref Range Status  05/30/2019 255 (H) 0 - 149 mg/dL Final          Failed - Valid encounter within last 12 months    Recent Outpatient Visits           6 months ago [place], Donald E, MD   2 years ago Estrogen deficiency   [facility] Birdie Sons, MD   2 years ago Sinusitis, unspecified chronicity, unspecified location   [facility] Birdie Sons, MD   2 years ago Other acute nonsuppurative otitis media of right ear, recurrence not specified   [facility] Birdie Sons, MD   2 years ago [place], Adriana M, [place]              Passed - Total Cholesterol in normal range and within 360 days    Cholesterol, Total  Date Value Ref Range Status  05/30/2019 143 100 - 199 mg/dL Final          Passed - Patient is not pregnant        lisinopril (ZESTRIL) 5 MG tablet [Pharmacy Med Name: LISINOPRIL 5 MG TABLET] 90 tablet 3    Sig: TAKE 1 TABLET BY MOUTH EVERY DAY      Cardiovascular:  ACE Inhibitors Failed - 12/24/2019  9:43 AM      Failed - Cr in normal range and within 180 days    Creatinine, Ser  Date Value Ref Range Status  05/30/2019 0.85 0.57 - 1.00 mg/dL Final          Failed - K in normal range and within 180 days    Potassium  Date Value Ref Range Status  05/30/2019 4.3 3.5 - 5.2 mmol/L Final          Failed - Valid encounter within last 6 months    Recent Outpatient Visits           6 months ago [place], Donald E, MD   2 years ago Estrogen deficiency   [facility] Birdie Sons, MD   2 years ago Sinusitis, unspecified chronicity, unspecified location   [facility] Birdie Sons, MD   2 years ago Other acute nonsuppurative otitis media of right  ear, recurrence not specified   [facility] Birdie Sons, MD   2 years ago [place], [place], [place] - Patient is not pregnant      Passed - Last BP in normal range    BP Readings from Last 1 Encounters:  10/09/19 121/64

## 2020-01-21 ENCOUNTER — Other Ambulatory Visit: Payer: Self-pay | Admitting: Family Medicine

## 2020-01-21 NOTE — Telephone Encounter (Signed)
Requested Prescriptions  Pending Prescriptions Disp Refills   fluticasone (FLONASE) 50 MCG/ACT nasal spray [Pharmacy Med Name: FLUTICASONE PROP 50 MCG SPRAY] 48 mL 1    Sig: PLACE 2 SPRAYS INTO BOTH NOSTRILS DAILY. 2(TWO) NASAL EACH NOSTRIL ONCE A DAY     Ear, Nose, and Throat: Nasal Preparations - Corticosteroids Failed - 01/21/2020  4:33 AM      Failed - Valid encounter within last 12 months    Recent Outpatient Visits          7 months ago [place], Donald E, MD   2 years ago Estrogen deficiency   [facility] Birdie Sons, MD   2 years ago Sinusitis, unspecified chronicity, unspecified location   [facility] Birdie Sons, MD   2 years ago Other acute nonsuppurative otitis media of right ear, recurrence not specified   [facility] Birdie Sons, MD   2 years ago [place], [place], [place]             Valid encounter 7 months ago.

## 2020-03-04 ENCOUNTER — Other Ambulatory Visit: Payer: Self-pay

## 2020-03-04 ENCOUNTER — Ambulatory Visit (INDEPENDENT_AMBULATORY_CARE_PROVIDER_SITE_OTHER): Payer: Medicare Other | Admitting: Family Medicine

## 2020-03-04 ENCOUNTER — Encounter: Payer: Self-pay | Admitting: Family Medicine

## 2020-03-04 VITALS — BP 137/85 | HR 68 | Temp 98.2°F | Ht 61.0 in | Wt 150.8 lb

## 2020-03-04 DIAGNOSIS — R300 Dysuria: Secondary | ICD-10-CM

## 2020-03-04 DIAGNOSIS — N3001 Acute cystitis with hematuria: Secondary | ICD-10-CM | POA: Diagnosis not present

## 2020-03-04 LAB — POCT URINALYSIS DIPSTICK
Bilirubin, UA: NEGATIVE
Glucose, UA: NEGATIVE
Ketones, UA: NEGATIVE
Nitrite, UA: NEGATIVE
Protein, UA: NEGATIVE
Spec Grav, UA: 1.01 (ref 1.010–1.025)
Urobilinogen, UA: 0.2 E.U./dL
pH, UA: 7 (ref 5.0–8.0)

## 2020-03-04 MED ORDER — CEFDINIR 300 MG PO CAPS: 300.0000 mg | ORAL_CAPSULE | Freq: Two times a day (BID) | ORAL | 0 refills | Status: DC

## 2020-03-04 NOTE — Progress Notes (Signed)
Established patient visit   Patient: Deanna Weaver   DOB: 24-Feb-1946   74 y.o. Female  MRN: [id number] Visit Date: 03/04/2020  Today's healthcare provider: Lelon Huh, MD   Chief Complaint  Patient presents with  . Dysuria   Mertie Moores as a scribe for Lelon Huh, MD.,have documented all relevant documentation on the behalf of Lelon Huh, MD,as directed by  Lelon Huh, MD while in the presence of Lelon Huh, MD.  Subjective    Dysuria  This is a new problem. Episode onset: Thursday. The problem occurs every urination. The problem has been unchanged. The quality of the pain is described as burning. There has been no fever. Associated symptoms include frequency. Pertinent negatives include no hematuria. Treatments tried: yeast infection medication. The treatment provided no relief.         Medications: Outpatient Medications Prior to Visit  Medication Sig  . clotrimazole-betamethasone (LOTRISONE) cream APPLY 1 APPLICATION TOPICALLY 2 (TWO) TIMES DAILY.  . fluticasone (FLONASE) 50 MCG/ACT nasal spray PLACE 2 SPRAYS INTO BOTH NOSTRILS DAILY. 2(TWO) NASAL EACH NOSTRIL ONCE A DAY  . latanoprost (XALATAN) 0.005 % ophthalmic solution Place 1 drop into both eyes at bedtime.  Marland Kitchen lisinopril (ZESTRIL) 5 MG tablet TAKE 1 TABLET BY MOUTH EVERY DAY  . montelukast (SINGULAIR) 10 MG tablet TAKE 1 TABLET (10 MG TOTAL) BY MOUTH DAILY. FOR ALLERGIES  . Multiple Vitamins-Minerals (PRESERVISION AREDS 2 PO) Take by mouth 2 (two) times daily.  . Omega-3 Fatty Acids (FISH OIL) 1000 MG CAPS Take 4 capsules by mouth daily.  . simvastatin (ZOCOR) 40 MG tablet TAKE 1 TABLET BY MOUTH EVERYDAY AT BEDTIME   No facility-administered medications prior to visit.    Review of Systems  Constitutional: Negative.   Respiratory: Negative.   Cardiovascular: Negative.   Genitourinary: Positive for dysuria and frequency. Negative for hematuria.  Musculoskeletal: Negative.        Objective    BP 137/85 (BP Location: Right Arm, Patient Position: Sitting, Cuff Size: Normal)   Pulse 68   Temp 98.2 F (36.8 C) (Oral)   Ht 5\' 1"  (1.549 m)   Wt 150 lb 12.8 oz (68.4 kg)   BMI 28.49 kg/m    Physical Exam   General: Appearance:     Overweight female in no acute distress  Eyes:    PERRL, conjunctiva/corneas clear, EOM's intact       Lungs:     Clear to auscultation bilaterally, respirations unlabored  Heart:    Normal heart rate. Normal rhythm. No murmurs, rubs, or gallops.   MS:   All extremities are intact.   Neurologic:   Awake, alert, oriented x 3. No apparent focal neurological           defect.        Results for orders placed or performed in visit on 03/04/20  POCT Urinalysis Dipstick  Result Value Ref Range   Color, UA pale yellow    Clarity, UA cloudy    Glucose, UA Negative Negative   Bilirubin, UA negative    Ketones, UA negative    Spec Grav, UA 1.010 1.010 - 1.025   Blood, UA nonhemolyzed trace    pH, UA 7.0 5.0 - 8.0   Protein, UA Negative Negative   Urobilinogen, UA 0.2 0.2 or 1.0 E.U./dL   Nitrite, UA negative    Leukocytes, UA Moderate (2+) (A) Negative   Appearance     Odor      Assessment &  Plan     1. Dysuria   2. Acute cystitis with hematuria  - Urine Culture - cefdinir (OMNICEF) 300 MG capsule; Take 1 capsule (300 mg total) by mouth 2 (two) times daily for 7 days.  Dispense: 14 capsule; Refill: 0         The entirety of the information documented in the History of Present Illness, Review of Systems and Physical Exam were personally obtained by me. Portions of this information were initially documented by the CMA and reviewed by me for thoroughness and accuracy.      Lelon Huh, MD  [facility] [phone] (phone) [phone] (fax)  [place]

## 2020-03-06 LAB — URINE CULTURE

## 2020-04-03 NOTE — Progress Notes (Signed)
Subjective:   Deanna Weaver is a 74 y.o. female who presents for Medicare Annual (Subsequent) preventive examination.  I connected with Jonne Ply today by telephone and verified that I am speaking with the correct person using two identifiers. Location patient: home Location provider: work Persons participating in the virtual visit: patient, provider.   I discussed the limitations, risks, security and privacy concerns of performing an evaluation and management service by telephone and the availability of in person appointments. I also discussed with the patient that there may be a patient responsible charge related to this service. The patient expressed understanding and verbally consented to this telephonic visit.    Interactive audio and video telecommunications were attempted between this provider and patient, however failed, due to patient having technical difficulties OR patient did not have access to video capability.  We continued and completed visit with audio only.   Review of Systems    N/A  Cardiac Risk Factors include: advanced age (>54men, >72 women);dyslipidemia;hypertension     Objective:    There were no vitals filed for this visit. There is no height or weight on file to calculate BMI.  Advanced Directives 04/04/2020 10/09/2019 04/24/2019 03/29/2019 03/27/2019 01/16/2019 10/27/2017  Does Patient Have a Medical Advance Directive? No No No No No No No  Would patient like information on creating a medical advance directive? No - Patient declined No - Patient declined No - Patient declined No - Patient declined No - Patient declined Yes (MAU/Ambulatory/Procedural Areas - Information given) No - Patient declined    Current Medications (verified) Outpatient Encounter Medications as of 04/04/2020  Medication Sig  . clotrimazole-betamethasone (LOTRISONE) cream APPLY 1 APPLICATION TOPICALLY 2 (TWO) TIMES DAILY.  . fluticasone (FLONASE) 50 MCG/ACT nasal spray PLACE 2 SPRAYS INTO  BOTH NOSTRILS DAILY. 2(TWO) NASAL EACH NOSTRIL ONCE A DAY  . latanoprost (XALATAN) 0.005 % ophthalmic solution Place 1 drop into both eyes at bedtime.  Marland Kitchen lisinopril (ZESTRIL) 5 MG tablet TAKE 1 TABLET BY MOUTH EVERY DAY  . montelukast (SINGULAIR) 10 MG tablet TAKE 1 TABLET (10 MG TOTAL) BY MOUTH DAILY. FOR ALLERGIES  . Omega-3 Fatty Acids (FISH OIL) 1000 MG CAPS Take 4 capsules by mouth daily.  . simvastatin (ZOCOR) 40 MG tablet TAKE 1 TABLET BY MOUTH EVERYDAY AT BEDTIME  . Multiple Vitamins-Minerals (PRESERVISION AREDS 2 PO) Take by mouth 2 (two) times daily. (Patient not taking: Reported on 04/04/2020)   No facility-administered encounter medications on file as of 04/04/2020.    Allergies (verified) Celecoxib, Metoprolol, Preservision areds [actical], and Penicillins   History: Past Medical History:  Diagnosis Date  . Allergic rhinitis   . Allergy   . Arthritis    lower back  . Hyperlipidemia   . Hypertension    Past Surgical History:  Procedure Laterality Date  . ABDOMINAL HYSTERECTOMY    . CATARACT EXTRACTION W/ INTRAOCULAR LENS IMPLANT Right   . CATARACT EXTRACTION W/PHACO Right 01/16/2019   Procedure: CATARACT EXTRACTION PHACO AND INTRAOCULAR LENS PLACEMENT ([place]) RIGHT;  Surgeon: Eulogio Bear, MD;  Location: [place];  Service: Ophthalmology;  Laterality: Right;  . CATARACT EXTRACTION W/PHACO Left 03/27/2019   Procedure: CATARACT EXTRACTION PHACO AND INTRAOCULAR LENS PLACEMENT ([place]) LEFT;  Surgeon: Eulogio Bear, MD;  Location: [place];  Service: Ophthalmology;  Laterality: Left;  . COLONOSCOPY    . COLONOSCOPY WITH PROPOFOL N/A 04/24/2019   Procedure: COLONOSCOPY WITH PROPOFOL;  Surgeon: Lucilla Lame, MD;  Location: [place];  Service:  Endoscopy;  Laterality: N/A;  . FLEXIBLE SIGMOIDOSCOPY N/A 10/09/2019   Procedure: FLEXIBLE SIGMOIDOSCOPY WITH BIOPSY;  Surgeon: Lucilla Lame, MD;  Location: [place];  Service: Endoscopy;   Laterality: N/A;  . FOOT SURGERY    . POLYPECTOMY  04/24/2019   Procedure: POLYPECTOMY;  Surgeon: Lucilla Lame, MD;  Location: [place];  Service: Endoscopy;;  . POLYPECTOMY N/A 10/09/2019   Procedure: POLYPECTOMY;  Surgeon: Lucilla Lame, MD;  Location: [place];  Service: Endoscopy;  Laterality: N/A;  . VAGINAL HYSTERECTOMY  1990   Family History  Problem Relation Age of Onset  . Cancer Mother   . Heart disease Father    Social History   Socioeconomic History  . Marital status: Married    Spouse name: Not on file  . Number of children: 4  . Years of education: Not on file  . Highest education level: 11th grade  Occupational History  . Occupation: Retired    Comment: keeps granchildren  Tobacco Use  . Smoking status: Never Smoker  . Smokeless tobacco: Never Used  Vaping Use  . Vaping Use: Never used  Substance and Sexual Activity  . Alcohol use: Never  . Drug use: No  . Sexual activity: Not on file  Other Topics Concern  . Not on file  Social History Narrative   ** Merged History Encounter **       Social Determinants of Health   Financial Resource Strain: Low Risk   . Difficulty of Paying Living Expenses: Not hard at all  Food Insecurity: No Food Insecurity  . Worried About Charity fundraiser in the Last Year: Never true  . Ran Out of Food in the Last Year: Never true  Transportation Needs: No Transportation Needs  . Lack of Transportation (Medical): No  . Lack of Transportation (Non-Medical): No  Physical Activity: Inactive  . Days of Exercise per Week: 0 days  . Minutes of Exercise per Session: 0 min  Stress: No Stress Concern Present  . Feeling of Stress : Not at all  Social Connections: Moderately Integrated  . Frequency of Communication with Friends and Family: More than three times a week  . Frequency of Social Gatherings with Friends and Family: More than three times a week  . Attends Religious Services: More than 4 times per year    . Active Member of Clubs or Organizations: No  . Attends Archivist Meetings: Never  . Marital Status: Married    Tobacco Counseling Counseling given: Not Answered   Clinical Intake:  Pre-visit preparation completed: Yes  Pain : No/denies pain     Nutritional Risks: None Diabetes: No  How often do you need to have someone help you when you read instructions, pamphlets, or other written materials from your doctor or pharmacy?: 1 - Never  Diabetic? No  Interpreter Needed?: No  Information entered by :: [facility], LPN   Activities of Daily Living In your present state of health, do you have any difficulty performing the following activities: 04/04/2020 03/04/2020  Hearing? N N  Vision? N N  Difficulty concentrating or making decisions? N N  Walking or climbing stairs? N N  Dressing or bathing? N N  Doing errands, shopping? N N  Preparing Food and eating ? N -  Using the Toilet? N -  In the past six months, have you accidently leaked urine? Y -  Comment Occsionally with pressure. Wears pads as needed. -  Do you have problems with loss  of bowel control? N -  Managing your Medications? N -  Managing your Finances? N -  Housekeeping or managing your Housekeeping? N -  Some recent data might be hidden    Patient Care Team: Birdie Sons, MD as PCP - General (Family Medicine) Eulogio Bear, MD as Consulting Physician (Ophthalmology) Lucilla Lame, MD as Consulting Physician (Gastroenterology)  Indicate any recent Medical Services you may have received from other than [facility] providers in the past year (date may be approximate).     Assessment:   This is a routine wellness examination for Marvie.  Hearing/Vision screen No exam data present  Dietary issues and exercise activities discussed: Current Exercise Habits: The patient has a physically strenuous job, but has no regular exercise apart from work., Exercise limited by: None identified  Goals    .  Exercise 3x per week (30 min per time)     Recommend to start walking 3 days a week for 30 minutes at a time. Pt to start this when the weather gets nice.     . Increase water intake     Recommend increasing water intake to 4 glasses a day.      Depression Screen PHQ 2/9 Scores 04/04/2020 03/04/2020 03/29/2019 10/27/2017 10/23/2016 10/23/2016 07/10/2016  PHQ - 2 Score 0 0 0 0 0 0 0  PHQ- 9 Score - - - - 0 - -    Fall Risk Fall Risk  04/04/2020 03/04/2020 03/29/2019 03/02/2019 10/27/2017  Falls in the past year? 0 0 0 (No Data) No  Comment - - - Emmi Telephone Survey: data to providers prior to load -  Number falls in past yr: 0 0 - (No Data) -  Comment - - - Emmi Telephone Survey Actual Response =  -  Injury with Fall? 0 0 - - -    Any stairs in or around the home? Yes  If so, are there any without handrails? No  Home free of loose throw rugs in walkways, pet beds, electrical cords, etc? Yes  Adequate lighting in your home to reduce risk of falls? Yes   ASSISTIVE DEVICES UTILIZED TO PREVENT FALLS:  Life alert? No  Use of a cane, walker or w/c? No  Grab bars in the bathroom? No  Shower chair or bench in shower? No  Elevated toilet seat or a handicapped toilet? No    Cognitive Function: Declined today.     6CIT Screen 10/23/2016  What Year? 4 points  What month? 0 points  What time? 0 points  Count back from 20 0 points  Months in reverse 0 points  Repeat phrase 2 points  Total Score 6    Immunizations Immunization History  Administered Date(s) Administered  . Influenza,inj,Quad PF,6+ Mos 05/30/2019  . Pneumococcal Conjugate-13 11/03/2013  . Pneumococcal Polysaccharide-23 10/23/2016    TDAP status: Due, Education has been provided regarding the importance of this vaccine. Advised may receive this vaccine at local pharmacy or Health Dept. Aware to provide a copy of the vaccination record if obtained from local pharmacy or Health Dept. Verbalized acceptance and understanding. Flu  Vaccine status: Up to date Pneumococcal vaccine status: Up to date Covid-19 vaccine status: Declined, Education has been provided regarding the importance of this vaccine but patient still declined. Advised may receive this vaccine at local pharmacy or Health Dept.or vaccine clinic. Aware to provide a copy of the vaccination record if obtained from local pharmacy or Health Dept. Verbalized acceptance and understanding.  Qualifies for  Shingles Vaccine? Yes   Zostavax completed No   Shingrix Completed?: No.    Education has been provided regarding the importance of this vaccine. Patient has been advised to call insurance company to determine out of pocket expense if they have not yet received this vaccine. Advised may also receive vaccine at local pharmacy or Health Dept. Verbalized acceptance and understanding.  Screening Tests Health Maintenance  Topic Date Due  . COVID-19 Vaccine (1) Never done  . INFLUENZA VACCINE  03/03/2020  . TETANUS/TDAP  08/03/2026 (Originally 05/01/1965)  . MAMMOGRAM  10/18/2021  . COLONOSCOPY  04/23/2024  . DEXA SCAN  Completed  . Hepatitis C Screening  Completed  . PNA vac Low Risk Adult  Completed    Health Maintenance  Health Maintenance Due  Topic Date Due  . COVID-19 Vaccine (1) Never done  . INFLUENZA VACCINE  03/03/2020    Colorectal cancer screening: Completed 10/09/19. Repeat every 5 years Mammogram status: Completed 10/19/19. Repeat every year Bone Density status: Completed 12/13/17. Previous DEXA scan was normal. No repeat needed unless advised by a physician.  Lung Cancer Screening: (Low Dose CT Chest recommended if Age 80-80 years, 30 pack-year currently smoking OR have quit w/in 15years.) does not qualify.    Additional Screening:  Hepatitis C Screening: Up to date  Vision Screening: Recommended annual ophthalmology exams for early detection of glaucoma and other disorders of the eye. Is the patient up to date with their annual eye exam?   Yes  Who is the provider or what is the name of the office in which the patient attends annual eye exams? Dr Edison Pace @ [facility] If pt is not established with a provider, would they like to be referred to a provider to establish care? No .   Dental Screening: Recommended annual dental exams for proper oral hygiene  Community Resource Referral / Chronic Care Management: CRR required this visit?  No   CCM required this visit?  No      Plan:     I have personally reviewed and noted the following in the patient's chart:   . Medical and social history . Use of alcohol, tobacco or illicit drugs  . Current medications and supplements . Functional ability and status . Nutritional status . Physical activity . Advanced directives . List of other physicians . Hospitalizations, surgeries, and ER visits in previous 12 months . Vitals . Screenings to include cognitive, depression, and falls . Referrals and appointments  In addition, I have reviewed and discussed with patient certain preventive protocols, quality metrics, and best practice recommendations. A written personalized care plan for preventive services as well as general preventive health recommendations were provided to patient.     Sircharles Holzheimer [place], [place]   8/0/1655   Nurse Notes: Due for a flu shot at next in office apt. Declined a Covid vaccine at this time.

## 2020-04-04 ENCOUNTER — Other Ambulatory Visit: Payer: Self-pay

## 2020-04-04 ENCOUNTER — Ambulatory Visit (INDEPENDENT_AMBULATORY_CARE_PROVIDER_SITE_OTHER): Payer: Medicare Other

## 2020-04-04 DIAGNOSIS — Z0000 Encounter for general adult medical examination without abnormal findings: Secondary | ICD-10-CM

## 2020-04-04 NOTE — Patient Instructions (Signed)
[name] , Thank you for taking time to come for your Medicare Wellness Visit. I appreciate your ongoing commitment to your health goals. Please review the following plan we discussed and let me know if I can assist you in the future.   Screening recommendations/referrals: Colonoscopy: Up to date, due 10/2024 Mammogram: Up to date, due 10/2020 Bone Density: Previous DEXA scan was normal. No repeat needed unless advised by a physician. Recommended yearly ophthalmology/optometry visit for glaucoma screening and checkup Recommended yearly dental visit for hygiene and checkup  Vaccinations: Influenza vaccine: Due fall 2021 Pneumococcal vaccine: Completed series Tdap vaccine: Currently due, declined at this time. Shingles vaccine: Shingrix discussed. Please contact your pharmacy for coverage information.     Advanced directives: Advance directive discussed with you today. Even though you declined this today please call our office should you change your mind and we can give you the proper paperwork for you to fill out.  Conditions/risks identified: Recommend to increase water intake to 6-8 8 oz glasses a day.  Next appointment: 06/18/20 @ 4:00 PM with Dr Caryn Section    Preventive Care 38 Years and Older, Female Preventive care refers to lifestyle choices and visits with your health care provider that can promote health and wellness. What does preventive care include?  A yearly physical exam. This is also called an annual well check.  Dental exams once or twice a year.  Routine eye exams. Ask your health care provider how often you should have your eyes checked.  Personal lifestyle choices, including:  Daily care of your teeth and gums.  Regular physical activity.  Eating a healthy diet.  Avoiding tobacco and drug use.  Limiting alcohol use.  Practicing safe sex.  Taking low-dose aspirin every day.  Taking vitamin and mineral supplements as recommended by your health care  provider. What happens during an annual well check? The services and screenings done by your health care provider during your annual well check will depend on your age, overall health, lifestyle risk factors, and family history of disease. Counseling  Your health care provider may ask you questions about your:  Alcohol use.  Tobacco use.  Drug use.  Emotional well-being.  Home and relationship well-being.  Sexual activity.  Eating habits.  History of falls.  Memory and ability to understand (cognition).  Work and work Statistician.  Reproductive health. Screening  You may have the following tests or measurements:  Height, weight, and BMI.  Blood pressure.  Lipid and cholesterol levels. These may be checked every 5 years, or more frequently if you are over 53 years old.  Skin check.  Lung cancer screening. You may have this screening every year starting at age 56 if you have a 30-pack-year history of smoking and currently smoke or have quit within the past 15 years.  Fecal occult blood test (FOBT) of the stool. You may have this test every year starting at age 50.  Flexible sigmoidoscopy or colonoscopy. You may have a sigmoidoscopy every 5 years or a colonoscopy every 10 years starting at age 47.  Hepatitis C blood test.  Hepatitis B blood test.  Sexually transmitted disease (STD) testing.  Diabetes screening. This is done by checking your blood sugar (glucose) after you have not eaten for a while (fasting). You may have this done every 1-3 years.  Bone density scan. This is done to screen for osteoporosis. You may have this done starting at age 9.  Mammogram. This may be done every 1-2 years. Talk  to your health care provider about how often you should have regular mammograms. Talk with your health care provider about your test results, treatment options, and if necessary, the need for more tests. Vaccines  Your health care provider may recommend certain  vaccines, such as:  Influenza vaccine. This is recommended every year.  Tetanus, diphtheria, and acellular pertussis (Tdap, Td) vaccine. You may need a Td booster every 10 years.  Zoster vaccine. You may need this after age 66.  Pneumococcal 13-valent conjugate (PCV13) vaccine. One dose is recommended after age 71.  Pneumococcal polysaccharide (PPSV23) vaccine. One dose is recommended after age 56. Talk to your health care provider about which screenings and vaccines you need and how often you need them. This information is not intended to replace advice given to you by your health care provider. Make sure you discuss any questions you have with your health care provider. Document Released: 08/16/2015 Document Revised: 04/08/2016 Document Reviewed: 05/21/2015 Elsevier Interactive Patient Education  2017 [employer] Prevention in the Home Falls can cause injuries. They can happen to people of all ages. There are many things you can do to make your home safe and to help prevent falls. What can I do on the outside of my home?  Regularly fix the edges of walkways and driveways and fix any cracks.  Remove anything that might make you trip as you walk through a door, such as a raised step or threshold.  Trim any bushes or trees on the path to your home.  Use bright outdoor lighting.  Clear any walking paths of anything that might make someone trip, such as rocks or tools.  Regularly check to see if handrails are loose or broken. Make sure that both sides of any steps have handrails.  Any raised decks and porches should have guardrails on the edges.  Have any leaves, snow, or ice cleared regularly.  Use sand or salt on walking paths during winter.  Clean up any spills in your garage right away. This includes oil or grease spills. What can I do in the bathroom?  Use night lights.  Install grab bars by the toilet and in the tub and shower. Do not use towel bars as grab  bars.  Use non-skid mats or decals in the tub or shower.  If you need to sit down in the shower, use a plastic, non-slip stool.  Keep the floor dry. Clean up any water that spills on the floor as soon as it happens.  Remove soap buildup in the tub or shower regularly.  Attach bath mats securely with double-sided non-slip rug tape.  Do not have throw rugs and other things on the floor that can make you trip. What can I do in the bedroom?  Use night lights.  Make sure that you have a light by your bed that is easy to reach.  Do not use any sheets or blankets that are too big for your bed. They should not hang down onto the floor.  Have a firm chair that has side arms. You can use this for support while you get dressed.  Do not have throw rugs and other things on the floor that can make you trip. What can I do in the kitchen?  Clean up any spills right away.  Avoid walking on wet floors.  Keep items that you use a lot in easy-to-reach places.  If you need to reach something above you, use a strong step stool that  has a grab bar.  Keep electrical cords out of the way.  Do not use floor polish or wax that makes floors slippery. If you must use wax, use non-skid floor wax.  Do not have throw rugs and other things on the floor that can make you trip. What can I do with my stairs?  Do not leave any items on the stairs.  Make sure that there are handrails on both sides of the stairs and use them. Fix handrails that are broken or loose. Make sure that handrails are as long as the stairways.  Check any carpeting to make sure that it is firmly attached to the stairs. Fix any carpet that is loose or worn.  Avoid having throw rugs at the top or bottom of the stairs. If you do have throw rugs, attach them to the floor with carpet tape.  Make sure that you have a light switch at the top of the stairs and the bottom of the stairs. If you do not have them, ask someone to add them for  you. What else can I do to help prevent falls?  Wear shoes that:  Do not have high heels.  Have rubber bottoms.  Are comfortable and fit you well.  Are closed at the toe. Do not wear sandals.  If you use a stepladder:  Make sure that it is fully opened. Do not climb a closed stepladder.  Make sure that both sides of the stepladder are locked into place.  Ask someone to hold it for you, if possible.  Clearly mark and make sure that you can see:  Any grab bars or handrails.  First and last steps.  Where the edge of each step is.  Use tools that help you move around (mobility aids) if they are needed. These include:  Canes.  Walkers.  Scooters.  Crutches.  Turn on the lights when you go into a dark area. Replace any light bulbs as soon as they burn out.  Set up your furniture so you have a clear path. Avoid moving your furniture around.  If any of your floors are uneven, fix them.  If there are any pets around you, be aware of where they are.  Review your medicines with your doctor. Some medicines can make you feel dizzy. This can increase your chance of falling. Ask your doctor what other things that you can do to help prevent falls. This information is not intended to replace advice given to you by your health care provider. Make sure you discuss any questions you have with your health care provider. Document Released: 05/16/2009 Document Revised: 12/26/2015 Document Reviewed: 08/24/2014 Elsevier Interactive Patient Education  2017 [employer].

## 2020-05-16 DIAGNOSIS — H401131 Primary open-angle glaucoma, bilateral, mild stage: Secondary | ICD-10-CM | POA: Diagnosis not present

## 2020-05-17 ENCOUNTER — Telehealth: Payer: Self-pay | Admitting: Family Medicine

## 2020-05-17 NOTE — Telephone Encounter (Signed)
Patient called to ask if the doctor would call in something for her sinus infection.  She stated that she gets it every year and she does not have a fever.  Please advise and call patient to let her know at [phone]

## 2020-05-20 ENCOUNTER — Telehealth (INDEPENDENT_AMBULATORY_CARE_PROVIDER_SITE_OTHER): Payer: Medicare Other | Admitting: Physician Assistant

## 2020-05-20 ENCOUNTER — Encounter: Payer: Self-pay | Admitting: Physician Assistant

## 2020-05-20 DIAGNOSIS — J069 Acute upper respiratory infection, unspecified: Secondary | ICD-10-CM | POA: Diagnosis not present

## 2020-05-20 NOTE — Telephone Encounter (Signed)
Patient scheduled for a virtual appt with Adriana. Patient needed to create a my chart before the visit so scheduled her at 3 pm.

## 2020-05-20 NOTE — Progress Notes (Signed)
MyChart Video Visit    Virtual Visit via Video Note   This visit type was conducted due to national recommendations for restrictions regarding the COVID-19 Pandemic (e.g. social distancing) in an effort to limit this patient's exposure and mitigate transmission in our community. This patient is at least at moderate risk for complications without adequate follow up. This format is felt to be most appropriate for this patient at this time. Physical exam was limited by quality of the video and audio technology used for the visit.   Patient location: Home Provider location: Office   I discussed the limitations of evaluation and management by telemedicine and the availability of in person appointments. The patient expressed understanding and agreed to proceed.  Patient: Deanna Weaver   DOB: 09-Jan-1946   74 y.o. Female  MRN: [id number] Visit Date: 05/20/2020  Today's healthcare provider: Trinna Post, PA-C   Chief Complaint  Patient presents with  . Nasal Congestion   Subjective    HPI HPI    Patient reports symptoms started Wednesday 05/15/2020 with sore throat, sinus drainage. She has no sick contacts. She did a home COVID test that was negative on Saturday 05/19/2020. She is not vaccinated against COVID.  She denies wheezing.    Last edited by Trinna Post, PA-C on 05/20/2020  3:22 PM. (History)          Medications: Outpatient Medications Prior to Visit  Medication Sig  . clotrimazole-betamethasone (LOTRISONE) cream APPLY 1 APPLICATION TOPICALLY 2 (TWO) TIMES DAILY.  . fluticasone (FLONASE) 50 MCG/ACT nasal spray PLACE 2 SPRAYS INTO BOTH NOSTRILS DAILY. 2(TWO) NASAL EACH NOSTRIL ONCE A DAY  . latanoprost (XALATAN) 0.005 % ophthalmic solution Place 1 drop into both eyes at bedtime.  Marland Kitchen lisinopril (ZESTRIL) 5 MG tablet TAKE 1 TABLET BY MOUTH EVERY DAY  . montelukast (SINGULAIR) 10 MG tablet TAKE 1 TABLET (10 MG TOTAL) BY MOUTH DAILY. FOR ALLERGIES  . Multiple  Vitamins-Minerals (PRESERVISION AREDS 2 PO) Take by mouth 2 (two) times daily.   . Omega-3 Fatty Acids (FISH OIL) 1000 MG CAPS Take 4 capsules by mouth daily.  . simvastatin (ZOCOR) 40 MG tablet TAKE 1 TABLET BY MOUTH EVERYDAY AT BEDTIME   No facility-administered medications prior to visit.    Review of Systems  HENT: Positive for postnasal drip, sinus pressure, sinus pain and sore throat.   Respiratory: Positive for cough.   All other systems reviewed and are negative.     Objective    There were no vitals taken for this visit.   Physical Exam Constitutional:      Appearance: Normal appearance.  Pulmonary:     Effort: Pulmonary effort is normal. No respiratory distress.  Neurological:     Mental Status: She is alert.  Psychiatric:        Mood and Affect: Mood normal.        Behavior: Behavior normal.        Assessment & Plan    1. Viral URI with cough  Tested negative for COVID on 05/18/2020.   Counseled regarding signs and symptoms of viral and bacterial respiratory infections. Advised to call or return for additional evaluation if she develops any sign of bacterial infection, or if current symptoms last longer than 10 days. May call back on 05/24/2020 if double sickening or worsening for abx for bacterial sinusitis. Continue OTC treatment.    No follow-ups on file.     I discussed the assessment and treatment plan with  the patient. The patient was provided an opportunity to ask questions and all were answered. The patient agreed with the plan and demonstrated an understanding of the instructions.   The patient was advised to call back or seek an in-person evaluation if the symptoms worsen or if the condition fails to improve as anticipated.   ITrinna Post, PA-C, have reviewed all documentation for this visit. The documentation on 05/20/20 for the exam, diagnosis, procedures, and orders are all accurate and complete.  The entirety of the information  documented in the History of Present Illness, Review of Systems and Physical Exam were personally obtained by me. Portions of this information were initially documented by Theresia Majors, CMA and reviewed by me for thoroughness and accuracy.       Paulene Floor [facility] [phone] (phone) [phone] (fax)  [place]

## 2020-05-24 ENCOUNTER — Telehealth: Payer: Self-pay

## 2020-05-24 DIAGNOSIS — J0110 Acute frontal sinusitis, unspecified: Secondary | ICD-10-CM

## 2020-05-24 NOTE — Telephone Encounter (Signed)
Copied from [place] [id number]. Topic: General - Other >> May 24, 2020 10:30 AM Leward Quan A wrote: Reason for CRM: Patient called in she had a virtual visit with Adriana on 05/20/20 and was told if she was not better by now to call. Patient states that she is getting worst and coughing up mucus and feel like whatever she has is moving into her chest. Can be reached at Ph# [phone]

## 2020-05-24 NOTE — Telephone Encounter (Signed)
lmtcb to be triaged -kw

## 2020-05-28 MED ORDER — DOXYCYCLINE HYCLATE 100 MG PO TABS: 100.0000 mg | ORAL_TABLET | Freq: Two times a day (BID) | ORAL | 0 refills | Status: AC

## 2020-05-28 NOTE — Telephone Encounter (Signed)
Doxycycline sent. Follow up in clinic if not improving.

## 2020-05-28 NOTE — Telephone Encounter (Signed)
Patient advised as directed below. 

## 2020-06-16 DIAGNOSIS — N390 Urinary tract infection, site not specified: Secondary | ICD-10-CM | POA: Diagnosis not present

## 2020-06-18 ENCOUNTER — Ambulatory Visit (INDEPENDENT_AMBULATORY_CARE_PROVIDER_SITE_OTHER): Payer: Medicare Other | Admitting: Family Medicine

## 2020-06-18 ENCOUNTER — Encounter: Payer: Self-pay | Admitting: Family Medicine

## 2020-06-18 ENCOUNTER — Other Ambulatory Visit: Payer: Self-pay

## 2020-06-18 ENCOUNTER — Ambulatory Visit: Payer: Medicare Other | Admitting: Adult Health

## 2020-06-18 VITALS — BP 136/81 | HR 97 | Temp 99.0°F | Resp 18 | Wt 149.0 lb

## 2020-06-18 DIAGNOSIS — I10 Essential (primary) hypertension: Secondary | ICD-10-CM | POA: Diagnosis not present

## 2020-06-18 DIAGNOSIS — R300 Dysuria: Secondary | ICD-10-CM | POA: Diagnosis not present

## 2020-06-18 DIAGNOSIS — E881 Lipodystrophy, not elsewhere classified: Secondary | ICD-10-CM | POA: Diagnosis not present

## 2020-06-18 DIAGNOSIS — N3001 Acute cystitis with hematuria: Secondary | ICD-10-CM | POA: Diagnosis not present

## 2020-06-18 DIAGNOSIS — R490 Dysphonia: Secondary | ICD-10-CM

## 2020-06-18 LAB — POCT URINALYSIS DIPSTICK
Glucose, UA: NEGATIVE
Ketones, UA: NEGATIVE
Nitrite, UA: NEGATIVE
Protein, UA: POSITIVE — AB
Spec Grav, UA: >=1.03 — AB (ref 1.010–1.025)
Urobilinogen, UA: 0.2 E.U./dL
pH, UA: 6 (ref 5.0–8.0)

## 2020-06-18 MED ORDER — OMEPRAZOLE 40 MG PO CPDR: 40.0000 mg | DELAYED_RELEASE_CAPSULE | Freq: Every day | ORAL | 3 refills | Status: DC

## 2020-06-18 MED ORDER — CEFDINIR 300 MG PO CAPS: 300.0000 mg | ORAL_CAPSULE | Freq: Two times a day (BID) | ORAL | 0 refills | Status: AC

## 2020-06-18 NOTE — Progress Notes (Signed)
Established patient visit   Patient: Deanna Weaver   DOB: 04/11/1946   74 y.o. Female  MRN: [id number] Visit Date: 06/18/2020  Today's healthcare provider: Lelon Huh, MD   Chief Complaint  Patient presents with  . Hyperlipidemia  . Hypertension  . Urinary Tract Infection   Subjective    HPI  Hypertension, follow-up  BP Readings from Last 3 Encounters:  06/18/20 136/81  03/04/20 137/85  10/09/19 121/64   Wt Readings from Last 3 Encounters:  06/18/20 149 lb (67.6 kg)  03/04/20 150 lb 12.8 oz (68.4 kg)  10/09/19 146 lb (66.2 kg)     She was last seen for hypertension 1 year ago.  BP at that visit was 134 /86. Management since that visit includes continuing same medication.  She reports good compliance with treatment. She is not having side effects.  She is following a Regular diet. She is not exercising. She does not smoke.  Use of agents associated with hypertension: none.   Outside blood pressures are not checked. Symptoms: No chest pain No chest pressure  No palpitations No syncope  No dyspnea No orthopnea  No paroxysmal nocturnal dyspnea No lower extremity edema   Pertinent labs: Lab Results  Component Value Date   CHOL 143 05/30/2019   HDL 30 (L) 05/30/2019   LDLCALC 71 05/30/2019   TRIG 255 (H) 05/30/2019   CHOLHDL 4.8 (H) 05/30/2019   Lab Results  Component Value Date   NA 140 05/30/2019   K 4.3 05/30/2019   CREATININE 0.85 05/30/2019   GFRNONAA 68 05/30/2019   GFRAA 79 05/30/2019   GLUCOSE 92 05/30/2019     The 10-year ASCVD risk score Mikey Bussing DC Jr., et al., 2013) is: 20.6%   ---------------------------------------------------------------------------------------------------  Lipid/Cholesterol, Follow-up  Last lipid panel Other pertinent labs  Lab Results  Component Value Date   CHOL 143 05/30/2019   HDL 30 (L) 05/30/2019   LDLCALC 71 05/30/2019   TRIG 255 (H) 05/30/2019   CHOLHDL 4.8 (H) 05/30/2019   Lab Results  Component  Value Date   ALT 16 05/30/2019   AST 21 05/30/2019     She was last seen for this 1 year ago.  Management since that visit includes advising patient to avoid sweets and starchy foods. Continue current medications.  She reports good compliance with treatment. She is not having side effects.  Current diet: well balanced Current exercise: none   --------------------------------------------------------------------------------------------------- UTI: Patient states that she was seen by [facility] 2 days ago for UTI. She was prescribed Augmentin. Patient states the dysuria has improved, but themedication is causing her to have diarrhea. She had similar UTI sx in August which resolved quickly with prescription of cefdinir with no adverse effects.   She states she also continues to have hoarseness that waxes and wanes for the last year. Was previously seen and thought to be secondary to sinus drainage, but it did not improve with fluticasone nasal spray, and she hasn't noticed any post nasal drainage lately.      Medications: Outpatient Medications Prior to Visit  Medication Sig  . amoxicillin-clavulanate (AUGMENTIN) 500-125 MG tablet Take 1 tablet by mouth 2 (two) times daily. For 7 days  . clotrimazole-betamethasone (LOTRISONE) cream APPLY 1 APPLICATION TOPICALLY 2 (TWO) TIMES DAILY.  . fluticasone (FLONASE) 50 MCG/ACT nasal spray PLACE 2 SPRAYS INTO BOTH NOSTRILS DAILY. 2(TWO) NASAL EACH NOSTRIL ONCE A DAY  . latanoprost (XALATAN) 0.005 % ophthalmic solution Place 1 drop into both  eyes at bedtime.  Marland Kitchen lisinopril (ZESTRIL) 5 MG tablet TAKE 1 TABLET BY MOUTH EVERY DAY  . montelukast (SINGULAIR) 10 MG tablet TAKE 1 TABLET (10 MG TOTAL) BY MOUTH DAILY. FOR ALLERGIES  . Multiple Vitamins-Minerals (PRESERVISION AREDS 2 PO) Take by mouth 2 (two) times daily.   . Omega-3 Fatty Acids (FISH OIL) 1000 MG CAPS Take 4 capsules by mouth daily.  . simvastatin (ZOCOR) 40 MG tablet TAKE 1 TABLET  BY MOUTH EVERYDAY AT BEDTIME   No facility-administered medications prior to visit.    Review of Systems  Constitutional: Negative for appetite change, chills, fatigue and fever.  HENT: Positive for voice change.   Respiratory: Positive for cough. Negative for chest tightness and shortness of breath.   Cardiovascular: Negative for chest pain and palpitations.  Gastrointestinal: Positive for diarrhea. Negative for abdominal pain, nausea and vomiting.  Genitourinary: Positive for dysuria.  Neurological: Negative for dizziness and weakness.      Objective    BP 136/81 (BP Location: Left Arm, Patient Position: Sitting, Cuff Size: Normal)   Pulse 97   Temp 99 F (37.2 C) (Oral)   Resp 18   Wt 149 lb (67.6 kg)   BMI 28.15 kg/m    Physical Exam   General appearance:  Overweight female, cooperative and in no acute distress Head: Normocephalic, without obvious abnormality, atraumatic Respiratory: Respirations even and unlabored, normal respiratory rate Extremities: All extremities are intact.  Skin: Skin color, texture, turgor normal. No rashes seen  Psych: Appropriate mood and affect. Neurologic: Mental status: Alert, oriented to person, place, and time, thought content appropriate.   Results for orders placed or performed in visit on 06/18/20  POCT Urinalysis Dipstick  Result Value Ref Range   Color, UA yellow    Clarity, UA clear    Glucose, UA Negative Negative   Bilirubin, UA small    Ketones, UA negative    Spec Grav, UA >=1.030 (A) 1.010 - 1.025   Blood, UA mderate (non-hemolyzed)    pH, UA 6.0 5.0 - 8.0   Protein, UA Positive (A) Negative   Urobilinogen, UA 0.2 0.2 or 1.0 E.U./dL   Nitrite, UA negative    Leukocytes, UA Moderate (2+) (A) Negative   Appearance     Odor      Assessment & Plan     1. Dysuria Slightly improved since starting Augmentin 2 days ago, but u/a still looks infected and having diarrhea since being on antibiotic.   2. Acute cystitis  with hematuria  - Urine Culture Change Augmentin to  cefdinir (OMNICEF) 300 MG capsule; Take 1 capsule (300 mg total) by mouth 2 (two) times daily for 5 days.  Dispense: 10 capsule; Refill: 0  3. Hoarseness Off and on for the last year with no improvement with treatment of allergies. May have some mild GERD, Will try- omeprazole (PRILOSEC) 40 MG capsule; Take 1 capsule (40 mg total) by mouth daily.  Dispense: 30 capsule; Refill: 3  Advised to call for ENT referral if not much better in 3-4 weeks.   4. Primary hypertension Well controlled.  Continue current medications.    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   - Comprehensive metabolic panel - Lipid panel - CBC   No follow-ups on file.      The entirety of the information documented in the History of Present Illness, Review of Systems and Physical Exam were personally obtained by me. Portions of this information were initially documented  by the [place] and reviewed by me for thoroughness and accuracy.      Lelon Huh, MD  [facility] [phone] (phone) [phone] (fax)  [place]

## 2020-06-18 NOTE — Patient Instructions (Signed)
.   Please review the attached list of medications and notify my office if there are any errors.   . Please bring all of your medications to every appointment so we can make sure that our medication list is the same as yours.    Let me know if the hoarseness is not better in 3-4 weeks after starting reflux medication. If not better then we should get you in with an ENT to look at your vocal cords

## 2020-06-19 LAB — COMPREHENSIVE METABOLIC PANEL
ALT: 13 IU/L (ref 0–32)
AST: 19 IU/L (ref 0–40)
Albumin/Globulin Ratio: 1.8 (ref 1.2–2.2)
Albumin: 4.8 g/dL — ABNORMAL HIGH (ref 3.7–4.7)
Alkaline Phosphatase: 67 IU/L (ref 44–121)
BUN/Creatinine Ratio: 17 (ref 12–28)
BUN: 16 mg/dL (ref 8–27)
Bilirubin Total: 0.5 mg/dL (ref 0.0–1.2)
CO2: 25 mmol/L (ref 20–29)
Calcium: 9.8 mg/dL (ref 8.7–10.3)
Chloride: 102 mmol/L (ref 96–106)
Creatinine, Ser: 0.94 mg/dL (ref 0.57–1.00)
GFR calc Af Amer: 69 mL/min/{1.73_m2} (ref >59)
GFR calc non Af Amer: 60 mL/min/{1.73_m2} (ref >59)
Globulin, Total: 2.6 g/dL (ref 1.5–4.5)
Glucose: 102 mg/dL — ABNORMAL HIGH (ref 65–99)
Potassium: 4.7 mmol/L (ref 3.5–5.2)
Sodium: 141 mmol/L (ref 134–144)
Total Protein: 7.4 g/dL (ref 6.0–8.5)

## 2020-06-19 LAB — CBC
Hematocrit: 43.9 % (ref 34.0–46.6)
Hemoglobin: 15.1 g/dL (ref 11.1–15.9)
MCH: 31.8 pg (ref 26.6–33.0)
MCHC: 34.4 g/dL (ref 31.5–35.7)
MCV: 92 fL (ref 79–97)
Platelets: 194 10*3/uL (ref 150–450)
RBC: 4.75 x10E6/uL (ref 3.77–5.28)
RDW: 12 % (ref 11.7–15.4)
WBC: 7.2 10*3/uL (ref 3.4–10.8)

## 2020-06-19 LAB — LIPID PANEL
Chol/HDL Ratio: 4.5 ratio — ABNORMAL HIGH (ref 0.0–4.4)
Cholesterol, Total: 148 mg/dL (ref 100–199)
HDL: 33 mg/dL — ABNORMAL LOW (ref >39)
LDL Chol Calc (NIH): 84 mg/dL (ref 0–99)
Triglycerides: 180 mg/dL — ABNORMAL HIGH (ref 0–149)
VLDL Cholesterol Cal: 31 mg/dL (ref 5–40)

## 2020-06-20 LAB — URINE CULTURE

## 2020-06-30 ENCOUNTER — Emergency Department
Admission: EM | Admit: 2020-06-30 | Discharge: 2020-06-30 | Disposition: A | Discharge status: Home / Self Care | Payer: Medicare Other | Attending: Emergency Medicine | Admitting: Emergency Medicine

## 2020-06-30 ENCOUNTER — Encounter: Payer: Self-pay | Admitting: Emergency Medicine

## 2020-06-30 ENCOUNTER — Other Ambulatory Visit: Payer: Self-pay

## 2020-06-30 DIAGNOSIS — Z79899 Other long term (current) drug therapy: Secondary | ICD-10-CM | POA: Diagnosis not present

## 2020-06-30 DIAGNOSIS — T7840XA Allergy, unspecified, initial encounter: Secondary | ICD-10-CM | POA: Diagnosis not present

## 2020-06-30 DIAGNOSIS — I10 Essential (primary) hypertension: Secondary | ICD-10-CM | POA: Diagnosis not present

## 2020-06-30 MED ORDER — DIPHENHYDRAMINE HCL 50 MG/ML IJ SOLN
50.0000 mg | Freq: Once | INTRAMUSCULAR | Status: AC
  Administered 2020-06-30: 50 mg via INTRAMUSCULAR
  Filled 2020-06-30: qty 1

## 2020-06-30 MED ORDER — FAMOTIDINE 20 MG PO TABS
20.0000 mg | ORAL_TABLET | Freq: Once | ORAL | Status: AC
  Administered 2020-06-30: 20 mg via ORAL
  Filled 2020-06-30: qty 1

## 2020-06-30 MED ORDER — METHYLPREDNISOLONE SODIUM SUCC 125 MG IJ SOLR
125.0000 mg | Freq: Once | INTRAMUSCULAR | Status: AC
  Administered 2020-06-30: 125 mg via INTRAMUSCULAR
  Filled 2020-06-30: qty 2

## 2020-06-30 MED ORDER — PREDNISONE 50 MG PO TABS: 50.0000 mg | ORAL_TABLET | Freq: Every day | ORAL | 0 refills | Status: DC

## 2020-06-30 NOTE — ED Notes (Signed)
Pt presents to ED with c/o possible allergic reaction. Pt has hives and generalized rash all over body. Pt denies any throat itching or SOB. Pt states rash began last night. Pt states she is taking a new medication call Cefdinir x 5 days. Pt denies any other new foods. Pt A&O x 4 and ambulatory on arrival.

## 2020-06-30 NOTE — ED Triage Notes (Signed)
Pt arrived via POV with reports of possible allergic reaction to cefdnir, pt states she started the medication around Monday has 1 dose left.  Pt states itching and rash started yesterday, pt does have swelling around eyes, but no swelling to lips or tongue.  Pt states she took 25mg  of Benadryl around 130pm.  Pt states she was taking the medication to treat a UTI.

## 2020-06-30 NOTE — ED Provider Notes (Signed)
[facility] Emergency Department Provider Note  ____________________________________________  Time seen: Approximately 3:08 PM  I have reviewed the triage vital signs and the nursing notes.   HISTORY  Chief Complaint Allergic Reaction    HPI Deanna Weaver is a 74 y.o. female who presents the emergency department complaining of possible allergic reaction.  Patient states that she broke out with hives, significant pruritus.  She said swelling about her eyes but none around the nose or mouth.  Patient denies any difficulty breathing or swallowing.  She denies any chest tightness or wheezing.  No emesis or diarrhea.  No history of anaphylaxis but patient has had multiple allergic reactions to antibiotics in the past.  Patient states that she just finished up a course of antibiotic and developed symptoms.  Patient also started a new reflux medication 5 days ago.  Patient cannot remember the names of either these 2 medications.  She has had over a week's worth of the antibiotic, 5 days of the reflux medication.  Patient took Benadryl at home which did not seem to improve her symptoms much.  She denies any other complaints at this time.   Patient was taking antibiotic for UTI.  It appears that patient had been taking Augmentin, had some difficulties with Augmentin and it was changed to cefdinir.  On review of patient's medical records, patient's urine culture did not grow any significant bacteria.  It appears that [place] and omeprazole were started after her primary care appointment on 06/18/2020.  Patient reportedly started her omeprazole 5 days ago.        Past Medical History:  Diagnosis Date  . Allergic rhinitis   . Allergy   . Arthritis    lower back  . Hyperlipidemia   . Hypertension     Patient Active Problem List   Diagnosis Date Noted  . History of adenomatous polyp of colon   . Special screening for malignant neoplasms, colon   . Polyp of ascending  colon   . Polyp of sigmoid colon   . Rectal polyp   . Dupuytren's contracture of hand 11/04/2017  . Allergic rhinitis 04/02/2015  . 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 Hypertension 04/02/2015  . Tremor 04/02/2015  . Varicose veins 04/02/2015  . LBP (low back pain) 06/11/2006  . Lipodystrophy 01/15/2006    Past Surgical History:  Procedure Laterality Date  . ABDOMINAL HYSTERECTOMY    . CATARACT EXTRACTION W/ INTRAOCULAR LENS IMPLANT Right   . CATARACT EXTRACTION W/PHACO Right 01/16/2019   Procedure: CATARACT EXTRACTION PHACO AND INTRAOCULAR LENS PLACEMENT ([place]) RIGHT;  Surgeon: Eulogio Bear, MD;  Location: [place];  Service: Ophthalmology;  Laterality: Right;  . CATARACT EXTRACTION W/PHACO Left 03/27/2019   Procedure: CATARACT EXTRACTION PHACO AND INTRAOCULAR LENS PLACEMENT ([place]) LEFT;  Surgeon: Eulogio Bear, MD;  Location: [place];  Service: Ophthalmology;  Laterality: Left;  . COLONOSCOPY    . COLONOSCOPY WITH PROPOFOL N/A 04/24/2019   Procedure: COLONOSCOPY WITH PROPOFOL;  Surgeon: Lucilla Lame, MD;  Location: [place];  Service: Endoscopy;  Laterality: N/A;  . FLEXIBLE SIGMOIDOSCOPY N/A 10/09/2019   Procedure: FLEXIBLE SIGMOIDOSCOPY WITH BIOPSY;  Surgeon: Lucilla Lame, MD;  Location: [place];  Service: Endoscopy;  Laterality: N/A;  . FOOT SURGERY    . POLYPECTOMY  04/24/2019   Procedure: POLYPECTOMY;  Surgeon: Lucilla Lame, MD;  Location: [place];  Service: Endoscopy;;  . POLYPECTOMY N/A 10/09/2019   Procedure: POLYPECTOMY;  Surgeon: Lucilla Lame, MD;  Location: [place];  Service: Endoscopy;  Laterality: N/A;  . [place]    Prior to Admission medications   Medication Sig Start Date End Date Taking? Authorizing Provider  clotrimazole-betamethasone (LOTRISONE) cream APPLY 1 APPLICATION TOPICALLY 2 (TWO) TIMES DAILY. 01/24/17   Birdie Sons, MD  fluticasone (FLONASE) 50 MCG/ACT nasal spray PLACE  2 SPRAYS INTO BOTH NOSTRILS DAILY. 2(TWO) NASAL EACH NOSTRIL ONCE A DAY 01/21/20   Birdie Sons, MD  latanoprost (XALATAN) 0.005 % ophthalmic solution Place 1 drop into both eyes at bedtime.    [provider]  lisinopril (ZESTRIL) 5 MG tablet TAKE 1 TABLET BY MOUTH EVERY DAY 12/24/19   Birdie Sons, MD  montelukast (SINGULAIR) 10 MG tablet TAKE 1 TABLET (10 MG TOTAL) BY MOUTH DAILY. FOR ALLERGIES 10/09/19   Birdie Sons, MD  Multiple Vitamins-Minerals (PRESERVISION AREDS 2 PO) Take by mouth 2 (two) times daily.     [provider]  Omega-3 Fatty Acids (FISH OIL) 1000 MG CAPS Take 4 capsules by mouth daily.    [provider]  omeprazole (PRILOSEC) 40 MG capsule Take 1 capsule (40 mg total) by mouth daily. 06/18/20   Birdie Sons, MD  predniSONE (DELTASONE) 50 MG tablet Take 1 tablet (50 mg total) by mouth daily with breakfast. 06/30/20   Marijke Guadiana, Charline Bills, PA-C  simvastatin (ZOCOR) 40 MG tablet TAKE 1 TABLET BY MOUTH EVERYDAY AT BEDTIME 12/24/19   Birdie Sons, MD    Allergies Celecoxib, Metoprolol, Preservision areds [actical], Cefdinir, and Penicillins  Family History  Problem Relation Age of Onset  . Cancer Mother   . Heart disease Father     Social History Social History   Tobacco Use  . Smoking status: Never Smoker  . Smokeless tobacco: Never Used  Vaping Use  . Vaping Use: Never used  Substance Use Topics  . Alcohol use: Never  . Drug use: No     Review of Systems  Constitutional: No fever/chills Eyes: No visual changes. No discharge ENT: No upper respiratory complaints. Cardiovascular: no chest pain. Respiratory: no cough. No SOB. Gastrointestinal: No abdominal pain.  No nausea, no vomiting.  No diarrhea.  No constipation. Musculoskeletal: Negative for musculoskeletal pain. Skin: Positive for hives and generalized pruritus Neurological: Negative for headaches, focal weakness or numbness.  10 System ROS otherwise  negative.  ____________________________________________   PHYSICAL EXAM:  VITAL SIGNS: ED Triage Vitals [06/30/20 1455]  Enc Vitals Group     BP (!) 156/84     Pulse Rate 77     Resp 18     Temp 98.1 F (36.7 C)     Temp Source Oral     SpO2 97 %     Weight 149 lb (67.6 kg)     Height 5\' 1"  (1.549 m)     Head Circumference      Peak Flow      Pain Score 0     Pain Loc      Pain Edu?      Excl. in [place]?      Constitutional: Alert and oriented. Well appearing and in no acute distress. Eyes: Conjunctivae are normal. PERRL. EOMI. Head: Atraumatic. ENT:      Ears:       Nose: No congestion/rhinnorhea.      Mouth/Throat: Mucous membranes are moist.  No oropharyngeal erythema or edema.  No angioedema. Neck: No stridor.    Cardiovascular: Normal rate, regular rhythm. Normal S1  and S2.  Good peripheral circulation. Respiratory: Normal respiratory effort without tachypnea or retractions. Lungs CTAB. Good air entry to the bases with no decreased or absent breath sounds. Gastrointestinal: Bowel sounds 4 quadrants. Soft and nontender to palpation. No guarding or rigidity. No palpable masses. No distention.  Musculoskeletal: Full range of motion to all extremities. No gross deformities appreciated. Neurologic:  Normal speech and language. No gross focal neurologic deficits are appreciated.  Skin:  Skin is warm, dry and intact. No rash noted. Psychiatric: Mood and affect are normal. Speech and behavior are normal. Patient exhibits appropriate insight and judgement.   ____________________________________________   LABS (all labs ordered are listed, but only abnormal results are displayed)  Labs Reviewed - No data to display ____________________________________________  EKG   ____________________________________________  RADIOLOGY   No results found.  ____________________________________________    PROCEDURES  Procedure(s) performed:     Procedures    Medications  methylPREDNISolone sodium succinate (SOLU-MEDROL) 125 mg/2 mL injection 125 mg (125 mg Intramuscular Given 06/30/20 1515)  diphenhydrAMINE (BENADRYL) injection 50 mg (50 mg Intramuscular Given 06/30/20 1524)  famotidine (PEPCID) tablet 20 mg (20 mg Oral Given 06/30/20 1516)     ____________________________________________   INITIAL IMPRESSION / ASSESSMENT AND PLAN / ED COURSE  Pertinent labs & imaging results that were available during my care of the patient were reviewed by me and considered in my medical decision making (see chart for details).  Review of the [place] CSRS was performed in accordance of the [place] prior to dispensing any controlled drugs.           Patient's diagnosis is consistent with allergic reaction.  Patient presents emergency department with generalized hives, pruritus.  No evidence of anaphylaxis with only 1 system involvement, no emesis, diarrhea, upper airway or lower airway involvement.  Patient received IM Benadryl and Solu-Medrol as well as oral famotidine.  Patient had significant improvement in her hives and pruritus.  Patient remained stable throughout her course in the emergency department.  I recommended taking nondrowsy antihistamine during the day, Benadryl if she has any breakthrough as well as a short course of steroid is it likely was secondary to an ingested medication.  At this time I recommended talking to her primary care before restarting her omeprazole.  Patient was on her last day of antibiotic and her urine culture did not grow any bacteria so I have recommended no further antibiotics for her UTI.Marland Kitchen  Patient is stable at this time.  She knows signs and symptoms to call 911 or return to the emergency department for worsening allergic reaction symptoms.  She will talk to her primary care about GERD medications before starting or restarting medication for her GERD.  Patient is given ED precautions to return to the ED for  any worsening or new symptoms.     ____________________________________________  FINAL CLINICAL IMPRESSION(S) / ED DIAGNOSES  Final diagnoses:  Allergic reaction, initial encounter      NEW MEDICATIONS STARTED DURING THIS VISIT:  ED Discharge Orders         Ordered    predniSONE (DELTASONE) 50 MG tablet  Daily with breakfast        06/30/20 1728              This chart was dictated using voice recognition software/Dragon. Despite best efforts to proofread, errors can occur which can change the meaning. Any change was purely unintentional.    Darletta Moll, PA-C 06/30/20 1734    Carrie Mew,  MD 07/02/20 [phone]

## 2020-07-08 ENCOUNTER — Other Ambulatory Visit: Payer: Self-pay

## 2020-07-08 ENCOUNTER — Ambulatory Visit: Payer: Self-pay

## 2020-07-08 ENCOUNTER — Ambulatory Visit (INDEPENDENT_AMBULATORY_CARE_PROVIDER_SITE_OTHER): Payer: Medicare Other | Admitting: Family Medicine

## 2020-07-08 DIAGNOSIS — T7840XA Allergy, unspecified, initial encounter: Secondary | ICD-10-CM | POA: Diagnosis not present

## 2020-07-08 NOTE — Progress Notes (Signed)
MyChart Video Visit    Virtual Visit via Video Note   This visit type was conducted due to national recommendations for restrictions regarding the COVID-19 Pandemic (e.g. social distancing) in an effort to limit this patient's exposure and mitigate transmission in our community. This patient is at least at moderate risk for complications without adequate follow up. This format is felt to be most appropriate for this patient at this time. Physical exam was limited by quality of the video and audio technology used for the visit.   Patient location: home Provider location: [facility]  I discussed the limitations of evaluation and management by telemedicine and the availability of in person appointments. The patient expressed understanding and agreed to proceed.  Patient: Deanna Weaver   DOB: [birth year]   74 y.o. Female  MRN: [id number] Visit Date: 07/08/2020  Today's healthcare provider: Lelon Huh, MD   Chief Complaint  Patient presents with  . Follow-up  . Allergic Reaction   Subjective    HPI  Follow up ER visit  Patient was seen in ER for allergic reaction on 06/30/2020. Treatment for this included  IM Benadryl and Solu-Medrol as well as oral famotidine.  Patient had significant improvement in her hives and pruritus.  Patient remained stable throughout her course in the emergency department. Recommended taking nondrowsy antihistamine during the day, Benadryl if she has any breakthrough as well as a short course of steroid is it likely was secondary to an ingested medication. She reports good compliance with treatment. She reports this condition is Improved.  -----------------------------------------------------------------------------------------     Medications: Outpatient Medications Prior to Visit  Medication Sig  . clotrimazole-betamethasone (LOTRISONE) cream APPLY 1 APPLICATION TOPICALLY 2 (TWO) TIMES DAILY.  . fluticasone (FLONASE) 50 MCG/ACT nasal spray PLACE 2 SPRAYS  INTO BOTH NOSTRILS DAILY. 2(TWO) NASAL EACH NOSTRIL ONCE A DAY  . latanoprost (XALATAN) 0.005 % ophthalmic solution Place 1 drop into both eyes at bedtime.  Marland Kitchen lisinopril (ZESTRIL) 5 MG tablet TAKE 1 TABLET BY MOUTH EVERY DAY  . montelukast (SINGULAIR) 10 MG tablet TAKE 1 TABLET (10 MG TOTAL) BY MOUTH DAILY. FOR ALLERGIES  . Multiple Vitamins-Minerals (PRESERVISION AREDS 2 PO) Take by mouth 2 (two) times daily.   . Omega-3 Fatty Acids (FISH OIL) 1000 MG CAPS Take 4 capsules by mouth daily.  Marland Kitchen omeprazole (PRILOSEC) 40 MG capsule Take 1 capsule (40 mg total) by mouth daily.  . predniSONE (DELTASONE) 50 MG tablet Take 1 tablet (50 mg total) by mouth daily with breakfast.  . simvastatin (ZOCOR) 40 MG tablet TAKE 1 TABLET BY MOUTH EVERYDAY AT BEDTIME   No facility-administered medications prior to visit.    Review of Systems  Constitutional: Negative for appetite change, chills, fatigue and fever.  Respiratory: Negative for chest tightness and shortness of breath.   Cardiovascular: Negative for chest pain and palpitations.  Gastrointestinal: Negative for abdominal pain, nausea and vomiting.  Neurological: Negative for dizziness and weakness.      Objective       Physical Exam   Awake, alert, oriented x 3. In no apparent distress   Assessment & Plan     1. Allergic reaction, initial encounter Resolved.    No follow-ups on file.     I discussed the assessment and treatment plan with the patient. The patient was provided an opportunity to ask questions and all were answered. The patient agreed with the plan and demonstrated an understanding of the instructions.   The patient was advised to  call back or seek an in-person evaluation if the symptoms worsen or if the condition fails to improve as anticipated.  I provided 8 minutes of non-face-to-face time during this encounter.  The entirety of the information documented in the History of Present Illness, Review of Systems and  Physical Exam were personally obtained by me. Portions of this information were initially documented by the CMA and reviewed by me for thoroughness and accuracy.     Lelon Huh, MD [facility] [phone] (phone) [phone] (fax)  [place]

## 2020-07-08 NOTE — Telephone Encounter (Signed)
Patient called and says she was prescribed and antibiotic for UTI by Dr. Caryn Section on 06/18/20. She says on Sunday, 06/30/20 she went to the [facility] due to a rash all up her neck. She says they prescribed prednisone for 5 days and she's taking acid reflux medication. She says the rash is under the skin and will appear when she's hot, flushed. She says she's feeling jittery and nervous and wonders if the prednisone is still in her system and causing this or is it something else. She denies any other symptoms. I advised the prednisone side effect is feeling jittery or nervous, advised she will need to be seen by Dr. Caryn Section as a follow up. She says she can come to the office later this evening because she has her grandchild with her. I advised no availability this evening. I called the office and the [facility] advised a virtual with Dr. Caryn Section at 1300 or to come to the office this afternoon with another provider. The patient says she will do the virtual with Dr. Caryn Section today. Appointment scheduled for 1300 with Dr. Caryn Section, care advice given, patient verbalized understanding.  Reason for Disposition . Prescription request for new medicine (not a refill)  Answer Assessment - Initial Assessment Questions 1. NAME of MEDICATION: "What medicine are you calling about?"     Prednisone 2. QUESTION: "What is your question?" (e.g., medication refill, side effect)      Should I still be feeling jittery and nervous feeling 3. PRESCRIBING HCP: "Who prescribed it?" Reason: if prescribed by specialist, call should be referred to that group.     [facility] 4. SYMPTOMS: "Do you have any symptoms?"     Jittery and nervous, still with the rash  5. SEVERITY: If symptoms are present, ask "Are they mild, moderate or severe?"     Mild 6. PREGNANCY:  "Is there any chance that you are pregnant?" "When was your last menstrual period?"     No  Protocols used: MEDICATION QUESTION CALL-A-AH

## 2020-07-10 ENCOUNTER — Telehealth: Payer: Self-pay

## 2020-07-10 NOTE — Telephone Encounter (Signed)
Have you seen these forms? 

## 2020-07-10 NOTE — Telephone Encounter (Signed)
Copied from [place] [id number]. Topic: General - Inquiry >> Jul 10, 2020 11:07 AM Oneta Rack wrote: Osvaldo Human name: Deanna Weaver  Relation to pt: [facility] Call back number: [phone] reference :fax [phone]   Reason for call:  Checking on the status of forms faxed to practice regarding "cardiovascular risk assessment test" . Forms are requiring the PCP signature, please  fax back. Please note if received.

## 2020-07-10 NOTE — Telephone Encounter (Signed)
Have these forms been faxed? Please call [facility] and update them with the status of these forms.

## 2020-07-10 NOTE — Telephone Encounter (Signed)
Yes, it's been sent to medical records to be faxed.

## 2020-07-11 NOTE — Telephone Encounter (Signed)
Copied from [place] [phone]. Topic: General - Other >> Jul 11, 2020  2:14 PM Rainey Pines A wrote: Jonni Sanger with [facility] [employer] would like a callback with a status update on medical records request for a [facility] to review and signed by Dr. Caryn Section and faxed back to [phone]. Best contact ius [phone]

## 2020-07-18 NOTE — Telephone Encounter (Signed)
[facility] calling to speak with Deanna Weaver  about a fax that was suppose to be returned For Pt Deanna Weaver MRN# [id number]/ He stated he never received form back and wanted to make sure they were not faxed during the issue they had with fax machine/ Please advise / Jonni Sanger From [facility] CB# [phone] ext [phone]

## 2020-07-19 NOTE — Telephone Encounter (Signed)
Called Jonni Sanger if he could refax form.

## 2020-07-23 NOTE — Telephone Encounter (Signed)
Jonni Sanger re faxed form to [place] on Friday 12.17.21/ it needs to reviewed, signed and faxed back to [phone]

## 2020-07-23 NOTE — Telephone Encounter (Signed)
Form that was re faxed to our office on Friday 07/19/2020 has been received. I called patient and asked if she requested services from this company. Patient didn't know who the company was and told me that she didn't request any genetic testing from this company. I called Jonni Sanger back and left a detailed message on his voice message system informing him that patient didn't request to have this testing done and she does not want to have it done.

## 2020-09-09 ENCOUNTER — Other Ambulatory Visit: Payer: Self-pay | Admitting: Family Medicine

## 2020-09-09 DIAGNOSIS — R490 Dysphonia: Secondary | ICD-10-CM

## 2020-10-07 DIAGNOSIS — M720 Palmar fascial fibromatosis [Dupuytren]: Secondary | ICD-10-CM | POA: Diagnosis not present

## 2020-10-07 DIAGNOSIS — M7741 Metatarsalgia, right foot: Secondary | ICD-10-CM | POA: Insufficient documentation

## 2020-10-08 DIAGNOSIS — M654 Radial styloid tenosynovitis [de Quervain]: Secondary | ICD-10-CM | POA: Diagnosis not present

## 2020-10-08 DIAGNOSIS — M65341 Trigger finger, right ring finger: Secondary | ICD-10-CM | POA: Diagnosis not present

## 2020-10-17 ENCOUNTER — Other Ambulatory Visit: Payer: Self-pay

## 2020-10-17 ENCOUNTER — Ambulatory Visit (INDEPENDENT_AMBULATORY_CARE_PROVIDER_SITE_OTHER): Payer: Medicare Other | Admitting: Physician Assistant

## 2020-10-17 ENCOUNTER — Encounter: Payer: Self-pay | Admitting: Physician Assistant

## 2020-10-17 VITALS — BP 125/82 | HR 76 | Temp 98.3°F | Wt 150.0 lb

## 2020-10-17 DIAGNOSIS — H66001 Acute suppurative otitis media without spontaneous rupture of ear drum, right ear: Secondary | ICD-10-CM

## 2020-10-17 DIAGNOSIS — N3090 Cystitis, unspecified without hematuria: Secondary | ICD-10-CM

## 2020-10-17 DIAGNOSIS — N390 Urinary tract infection, site not specified: Secondary | ICD-10-CM

## 2020-10-17 LAB — POCT URINALYSIS DIPSTICK
Bilirubin, UA: NEGATIVE
Glucose, UA: NEGATIVE
Ketones, UA: NEGATIVE
Nitrite, UA: NEGATIVE
Protein, UA: NEGATIVE
Spec Grav, UA: 1.02 (ref 1.010–1.025)
Urobilinogen, UA: 0.2 E.U./dL
pH, UA: 6 (ref 5.0–8.0)

## 2020-10-17 MED ORDER — NITROFURANTOIN MONOHYD MACRO 100 MG PO CAPS: 100.0000 mg | ORAL_CAPSULE | Freq: Two times a day (BID) | ORAL | 0 refills | Status: DC

## 2020-10-17 MED ORDER — CIPROFLOXACIN-DEXAMETHASONE 0.3-0.1 % OT SUSP: 4.0000 [drp] | Freq: Two times a day (BID) | OTIC | 0 refills | Status: DC

## 2020-10-17 MED ORDER — ESTRADIOL 0.1 MG/GM VA CREA: 1.0000 | TOPICAL_CREAM | VAGINAL | 12 refills | Status: DC

## 2020-10-17 NOTE — Progress Notes (Signed)
Established patient visit   Patient: Deanna Weaver   DOB: Nov 20, 1945   75 y.o. Female  MRN: [id number] Visit Date: 10/17/2020  Today's healthcare provider: Mar Daring, PA-C   Chief Complaint  Patient presents with  . Urinary Tract Infection  . Ear Pain   Subjective    Urinary Tract Infection  This is a new problem. The current episode started in the past 7 days. The problem has been gradually worsening. There has been no fever. Associated symptoms include frequency and urgency. Pertinent negatives include no chills, hematuria, nausea or vomiting. She has tried acetaminophen (Azo) for the symptoms. The treatment provided no relief.  Otalgia  There is pain in the right ear. This is a new problem. The current episode started yesterday. The problem has been gradually worsening. There has been no fever. Pertinent negatives include no abdominal pain, diarrhea, ear discharge, headaches, hearing loss, rhinorrhea, sore throat or vomiting. She has tried acetaminophen for the symptoms. The treatment provided mild relief.      Patient Active Problem List   Diagnosis Date Noted  . History of adenomatous polyp of colon   . Special screening for malignant neoplasms, colon   . Polyp of ascending colon   . Polyp of sigmoid colon   . Rectal polyp   . Dupuytren's contracture of hand 11/04/2017  . Allergic rhinitis 04/02/2015  . 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 Hypertension 04/02/2015  . Tremor 04/02/2015  . Varicose veins 04/02/2015  . LBP (low back pain) 06/11/2006  . 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 Allergic rhinitis   . Allergy   . Arthritis    lower back  . Hyperlipidemia   . Hypertension    Social History   Tobacco Use  . Smoking status: Never Smoker  . Smokeless tobacco: Never Used  Vaping Use  . Vaping Use: Never used  Substance Use Topics  . Alcohol use: Never  . Drug use: No   Allergies  Allergen Reactions  . Celecoxib  Swelling  . Metoprolol     Depression, palpitations.  . Preservision Areds [Actical] Nausea Only  . Cefdinir Rash  . Penicillins Rash     Medications: Outpatient Medications Prior to Visit  Medication Sig  . clotrimazole-betamethasone (LOTRISONE) cream APPLY 1 APPLICATION TOPICALLY 2 (TWO) TIMES DAILY.  . fluticasone (FLONASE) 50 MCG/ACT nasal spray PLACE 2 SPRAYS INTO BOTH NOSTRILS DAILY. 2(TWO) NASAL EACH NOSTRIL ONCE A DAY  . latanoprost (XALATAN) 0.005 % ophthalmic solution Place 1 drop into both eyes at bedtime.  Marland Kitchen lisinopril (ZESTRIL) 5 MG tablet TAKE 1 TABLET BY MOUTH EVERY DAY  . montelukast (SINGULAIR) 10 MG tablet TAKE 1 TABLET (10 MG TOTAL) BY MOUTH DAILY. FOR ALLERGIES  . Multiple Vitamins-Minerals (PRESERVISION AREDS 2 PO) Take by mouth 2 (two) times daily.   . Omega-3 Fatty Acids (FISH OIL) 1000 MG CAPS Take 4 capsules by mouth daily.  Marland Kitchen omeprazole (PRILOSEC) 40 MG capsule TAKE 1 CAPSULE BY MOUTH EVERY DAY  . simvastatin (ZOCOR) 40 MG tablet TAKE 1 TABLET BY MOUTH EVERYDAY AT BEDTIME  . [DISCONTINUED] predniSONE (DELTASONE) 50 MG tablet Take 1 tablet (50 mg total) by mouth daily with breakfast.   No facility-administered medications prior to visit.    Review of Systems  Constitutional: Negative for activity change, appetite change, chills, diaphoresis, fatigue, fever and unexpected weight change.  HENT: Positive for ear pain. Negative for ear discharge, hearing loss, postnasal drip, rhinorrhea, sinus pressure,  sinus pain, sore throat and trouble swallowing.   Eyes: Negative.   Respiratory: Negative.   Gastrointestinal: Negative for abdominal distention, abdominal pain, anal bleeding, constipation, diarrhea, nausea and vomiting.  Genitourinary: Positive for dysuria, frequency and urgency. Negative for decreased urine volume, difficulty urinating, hematuria, vaginal bleeding, vaginal discharge and vaginal pain.  Musculoskeletal: Negative for back pain.   Allergic/Immunologic: Positive for environmental allergies.  Neurological: Negative for headaches.      Objective    BP 125/82 (BP Location: Left Arm, Patient Position: Sitting, Cuff Size: Large)   Pulse 76   Temp 98.3 F (36.8 C) (Oral)   Wt 150 lb (68 kg)   BMI 28.34 kg/m    Physical Exam Vitals reviewed.  Constitutional:      General: She is not in acute distress.    Appearance: Normal appearance. She is well-developed. She is not ill-appearing or diaphoretic.  HENT:     Head: Normocephalic and atraumatic.     Right Ear: Ear canal and external ear normal. A middle ear effusion (opaque) is present. Tympanic membrane is not perforated, erythematous or bulging.     Left Ear: Tympanic membrane, ear canal and external ear normal.  Cardiovascular:     Rate and Rhythm: Normal rate and regular rhythm.     Heart sounds: Normal heart sounds. No murmur heard. No friction rub. No gallop.   Pulmonary:     Effort: Pulmonary effort is normal. No respiratory distress.     Breath sounds: Normal breath sounds. No wheezing or rales.  Abdominal:     General: Bowel sounds are normal. There is no distension.     Palpations: Abdomen is soft. There is no mass.     Tenderness: There is abdominal tenderness in the suprapubic area. There is no guarding or rebound.  Skin:    General: Skin is warm and dry.  Neurological:     Mental Status: She is alert and oriented to person, place, and time.      Results for orders placed or performed in visit on 10/17/20  CULTURE, URINE COMPREHENSIVE   Specimen: Urine   Urine  Result Value Ref Range   Urine Culture, Comprehensive Final report (A)    Organism ID, Bacteria Escherichia coli (A)    Organism ID, Bacteria Comment    ANTIMICROBIAL SUSCEPTIBILITY Comment   Urinalysis, microscopic only  Result Value Ref Range   WBC, UA >30 (A) 0 - 5 /hpf   RBC 0-2 0 - 2 /hpf   Epithelial Cells (non renal) 0-10 0 - 10 /hpf   Casts None seen None seen /lpf    Bacteria, UA Many (A) None seen/Few  Specimen status report  Result Value Ref Range   specimen status report Comment   Specimen status report  Result Value Ref Range   specimen status report Comment   POCT urinalysis dipstick  Result Value Ref Range   Color, UA     Clarity, UA     Glucose, UA Negative Negative   Bilirubin, UA Negative    Ketones, UA Negative    Spec Grav, UA 1.020 1.010 - 1.025   Blood, UA Moderate    pH, UA 6.0 5.0 - 8.0   Protein, UA Negative Negative   Urobilinogen, UA 0.2 0.2 or 1.0 E.U./dL   Nitrite, UA Negative    Leukocytes, UA Moderate (2+) (A) Negative   Appearance     Odor      Assessment & Plan     1. Cystitis  Worsening symptoms. UA positive. Will treat empirically with Macrobid.  Continue to push fluids. Urine sent for culture. Will follow up pending C&S results. She is to call if symptoms do not improve or if they worsen.  - CULTURE, URINE COMPREHENSIVE - Urinalysis, microscopic only - POCT urinalysis dipstick - nitrofurantoin, macrocrystal-monohydrate, (MACROBID) 100 MG capsule; Take 1 capsule (100 mg total) by mouth 2 (two) times daily.  Dispense: 14 capsule; Refill: 0  2. Non-recurrent acute suppurative otitis media of right ear without spontaneous rupture of tympanic membrane Ciprodex given for ear infection as below. Call if worsening. - ciprofloxacin-dexamethasone (CIPRODEX) OTIC suspension; Place 4 drops into the right ear 2 (two) times daily. X 5-7 days  Dispense: 7.5 mL; Refill: 0  3. Recurrent UTI Patient has been having recurrent UTIs and is interested in trying estradiol to see if this helps reduce frequency.  - estradiol (ESTRACE VAGINAL) 0.1 MG/GM vaginal cream; Place 1 Applicatorful vaginally 2 (two) times a week.  Dispense: 42.5 g; Refill: 12   No follow-ups on file.      Reynolds Bowl, PA-C, have reviewed all documentation for this visit. The documentation on 10/20/20 for the exam, diagnosis, procedures, and orders  are all accurate and complete.   Rubye Beach  [facility] [phone] (phone) [phone] (fax)  [place]

## 2020-10-17 NOTE — Patient Instructions (Signed)
Otitis Media, Adult  Otitis media occurs when there is inflammation and fluid in the middle ear space with signs and symptoms of an acute infection. The middle ear is a part of the ear that contains bones for hearing as well as air that helps send sounds to the brain. When infected fluid builds up in this space, it causes pressure and results in symptoms of acute otitis media. The eustachian tube connects the middle ear to the back of the nose (nasopharynx) and normally allows air into the middle ear space. If the eustachian tube becomes blocked, fluid can build up and become infected. What are the causes? This condition is caused by a blockage in the eustachian tube. This can be caused by an object like mucus, or by swelling (edema) of the tube. Problems that can cause a blockage include:  A cold or other upper respiratory infection.  Allergies.  An irritant, such as tobacco smoke.  Enlarged adenoids. The adenoids are areas of soft tissue located high in the back of the throat, behind the nose and the roof of the mouth. They are part of the body's defense system (immune system).  A mass in the nasopharynx.  Damage to the ear caused by pressure changes (barotrauma). What are the signs or symptoms? Symptoms of this condition include:  Ear pain.  Fever.  Decreased hearing.  Tiredness (lethargy).  Fluid leaking from the ear, if the eardrum is ruptured or has burst.  Ringing in the ear. How is this diagnosed? This condition is diagnosed with a physical exam. During the exam, your health care provider will use an instrument called an otoscope to look in your ear and check for redness, swelling, and fluid. He or she will also ask about your symptoms. Your health care provider may also order tests, such as:  A pneumatic otoscopy. This is a test to check the movement of the eardrum. It is done by squeezing a small amount of air into the ear.  A tympanogram is a test that shows how well  the eardrum moves in response to air pressure in the ear canal. It provides a graph for your health care provider to review.   How is this treated? This condition can go away on its own within 3-5 days. But if the condition is caused by a bacterial infection and does not go away on its own, or if it keeps coming back, your health care provider may:  Prescribe antibiotic medicine to treat the infection.  Prescribe or recommend medicines to control pain. Follow these instructions at home:  Take over-the-counter and prescription medicines only as told by your health care provider.  If you were prescribed an antibiotic medicine, take it as told by your health care provider. Do not stop taking the antibiotic even if you start to feel better.  Keep all follow-up visits as told by your health care provider. This is important. Contact a health care provider if:  You have bleeding from your nose.  There is a lump on your neck.  You are not feeling better in 5 days.  You feel worse instead of better. Get help right away if:  You have severe pain that is not controlled with medicine.  You have swelling, redness, or pain around your ear.  You have stiffness in your neck.  A part of your face is not moving (paralyzed).  The bone behind your ear (mastoid) is tender when you touch it.  You develop a severe headache. Summary  Otitis media is redness, soreness, and swelling of the middle ear, usually resulting in pain.  This condition can go away on its own within 3-5 days.  If the problem does not go away in 3-5 days, your health care provider may prescribe or recommend medicines to treat the infection or your symptoms.  If you were prescribed an antibiotic medicine, take it as told by your health care provider.  Follow all instructions you were given by your health care provider. This information is not intended to replace advice given to you by your health care provider. Make sure  you discuss any questions you have with your health care provider. Document Revised: 06/22/2019 Document Reviewed: 06/22/2019 Elsevier Patient Education  2021 [place].   Urinary Tract Infection, Adult A urinary tract infection (UTI) is an infection of any part of the urinary tract. The urinary tract includes:  The kidneys.  The ureters.  The bladder.  The urethra. These organs make, store, and get rid of pee (urine) in the body. What are the causes? This infection is caused by germs (bacteria) in your genital area. These germs grow and cause swelling (inflammation) of your urinary tract. What increases the risk? The following factors may make you more likely to develop this condition:  Using a small, thin tube (catheter) to drain pee.  Not being able to control when you pee or poop (incontinence).  Being female. If you are female, these things can increase the risk: ? Using these methods to prevent pregnancy:  A medicine that kills sperm (spermicide).  A device that blocks sperm (diaphragm). ? Having low levels of a female hormone (estrogen). ? Being pregnant. You are more likely to develop this condition if:  You have genes that add to your risk.  You are sexually active.  You take antibiotic medicines.  You have trouble peeing because of: ? A prostate that is bigger than normal, if you are female. ? A blockage in the part of your body that drains pee from the bladder. ? A kidney stone. ? A nerve condition that affects your bladder. ? Not getting enough to drink. ? Not peeing often enough.  You have other conditions, such as: ? Diabetes. ? A weak disease-fighting system (immune system). ? Sickle cell disease. ? Gout. ? Injury of the spine. What are the signs or symptoms? Symptoms of this condition include:  Needing to pee right away.  Peeing small amounts often.  Pain or burning when peeing.  Blood in the pee.  Pee that smells bad or not like  normal.  Trouble peeing.  Pee that is cloudy.  Fluid coming from the vagina, if you are female.  Pain in the belly or lower back. Other symptoms include:  Vomiting.  Not feeling hungry.  Feeling mixed up (confused). This may be the first symptom in older adults.  Being tired and grouchy (irritable).  A fever.  Watery poop (diarrhea). How is this treated?  Taking antibiotic medicine.  Taking other medicines.  Drinking enough water. In some cases, you may need to see a specialist. Follow these instructions at home: Medicines  Take over-the-counter and prescription medicines only as told by your doctor.  If you were prescribed an antibiotic medicine, take it as told by your doctor. Do not stop taking it even if you start to feel better. General instructions  Make sure you: ? Pee until your bladder is empty. ? Do not hold pee for a long time. ? Empty your bladder  after sex. ? Wipe from front to back after peeing or pooping if you are a female. Use each tissue one time when you wipe.  Drink enough fluid to keep your pee pale yellow.  Keep all follow-up visits.   Contact a doctor if:  You do not get better after 1-2 days.  Your symptoms go away and then come back. Get help right away if:  You have very bad back pain.  You have very bad pain in your lower belly.  You have a fever.  You have chills.  You feeling like you will vomit or you vomit. Summary  A urinary tract infection (UTI) is an infection of any part of the urinary tract.  This condition is caused by germs in your genital area.  There are many risk factors for a UTI.  Treatment includes antibiotic medicines.  Drink enough fluid to keep your pee pale yellow. This information is not intended to replace advice given to you by your health care provider. Make sure you discuss any questions you have with your health care provider. Document Revised: 03/01/2020 Document Reviewed:  03/01/2020 Elsevier Patient Education  [place].

## 2020-10-18 ENCOUNTER — Telehealth: Payer: Self-pay | Admitting: Family Medicine

## 2020-10-18 DIAGNOSIS — N3090 Cystitis, unspecified without hematuria: Secondary | ICD-10-CM

## 2020-10-18 LAB — URINALYSIS, MICROSCOPIC ONLY
Casts: NONE SEEN /lpf
WBC, UA: >30 /hpf — AB (ref 0–5)

## 2020-10-18 LAB — SPECIMEN STATUS REPORT

## 2020-10-18 NOTE — Telephone Encounter (Signed)
Pt called and is requesting to have pain medication sent in to help her with the pain in her ear. She states that she has taken tylenol and that is not helping. Please advise.       [id number] - [place], [place] - [address]  [address] [address]  Phone: [phone] Fax: [phone]  Hours: Not open 24 hours

## 2020-10-20 ENCOUNTER — Encounter: Payer: Self-pay | Admitting: Physician Assistant

## 2020-10-20 LAB — CULTURE, URINE COMPREHENSIVE

## 2020-10-20 LAB — SPECIMEN STATUS REPORT

## 2020-10-20 MED ORDER — PREDNISONE 20 MG PO TABS: 20.0000 mg | ORAL_TABLET | Freq: Every day | ORAL | 0 refills | Status: DC

## 2020-10-20 NOTE — Telephone Encounter (Signed)
Prednisone sent in to [facility]

## 2020-10-28 MED ORDER — NITROFURANTOIN MONOHYD MACRO 100 MG PO CAPS: 100.0000 mg | ORAL_CAPSULE | Freq: Two times a day (BID) | ORAL | 0 refills | Status: AC

## 2020-10-28 NOTE — Telephone Encounter (Addendum)
Pt called in for assistance. Pt says that she completed the round of antibiotic that she was prescribed. Pt feels that she need another round. Pt would like to know if provider could send In another Rx. Pt says that she is starting to have the pain again.    Please assist pt further.

## 2020-10-28 NOTE — Addendum Note (Signed)
Addended by: Birdie Sons on: 10/28/2020 05:18 PM   Modules accepted: Orders

## 2020-10-28 NOTE — Telephone Encounter (Signed)
Prescription macrobid sent to [facility]

## 2020-10-29 NOTE — Telephone Encounter (Signed)
I called pt to make sure she picked up her

## 2020-11-14 DIAGNOSIS — H401131 Primary open-angle glaucoma, bilateral, mild stage: Secondary | ICD-10-CM | POA: Diagnosis not present

## 2020-12-19 ENCOUNTER — Other Ambulatory Visit: Payer: Self-pay | Admitting: Family Medicine

## 2020-12-19 DIAGNOSIS — J309 Allergic rhinitis, unspecified: Secondary | ICD-10-CM

## 2021-01-08 ENCOUNTER — Other Ambulatory Visit: Payer: Self-pay | Admitting: Family Medicine

## 2021-01-08 DIAGNOSIS — Z1231 Encounter for screening mammogram for malignant neoplasm of breast: Secondary | ICD-10-CM

## 2021-01-09 ENCOUNTER — Other Ambulatory Visit: Payer: Self-pay

## 2021-01-09 ENCOUNTER — Ambulatory Visit
Admission: RE | Admit: 2021-01-09 | Discharge: 2021-01-09 | Disposition: A | Discharge status: Home / Self Care | Payer: Medicare Other | Source: Ambulatory Visit | Attending: Family Medicine | Admitting: Family Medicine

## 2021-01-09 DIAGNOSIS — Z1231 Encounter for screening mammogram for malignant neoplasm of breast: Secondary | ICD-10-CM | POA: Diagnosis not present

## 2021-01-14 ENCOUNTER — Other Ambulatory Visit: Payer: Self-pay | Admitting: Family Medicine

## 2021-03-08 ENCOUNTER — Other Ambulatory Visit: Payer: Self-pay | Admitting: Family Medicine

## 2021-03-08 NOTE — Telephone Encounter (Signed)
Requested Prescriptions  Pending Prescriptions Disp Refills  . lisinopril (ZESTRIL) 5 MG tablet [Pharmacy Med Name: LISINOPRIL 5 MG TABLET] 90 tablet     Sig: TAKE 1 TABLET BY MOUTH EVERY DAY     Cardiovascular:  ACE Inhibitors Failed - 03/08/2021 12:51 PM      Failed - Cr in normal range and within 180 days    Creatinine, Ser  Date Value Ref Range Status  06/18/2020 0.94 0.57 - 1.00 mg/dL Final         Failed - K in normal range and within 180 days    Potassium  Date Value Ref Range Status  06/18/2020 4.7 3.5 - 5.2 mmol/L Final         Passed - Patient is not pregnant      Passed - Last BP in normal range    BP Readings from Last 1 Encounters:  10/17/20 125/82         Passed - Valid encounter within last 6 months    Recent Outpatient Visits          4 months ago [place], Jennifer M, PA-C   8 months ago Allergic reaction, initial encounter   [facility] Birdie Sons, MD   8 months ago [place], Donald E, MD   9 months ago Viral URI with cough   [facility] Carles Collet M, [place]   1 year ago [place], Donald E, MD             . simvastatin (ZOCOR) 40 MG tablet [Pharmacy Med Name: SIMVASTATIN 40 MG TABLET] 90 tablet 0    Sig: TAKE 1 TABLET BY MOUTH EVERYDAY AT BEDTIME     Cardiovascular:  Antilipid - Statins Failed - 03/08/2021 12:51 PM      Failed - HDL in normal range and within 360 days    HDL  Date Value Ref Range Status  06/18/2020 33 (L) >39 mg/dL Final         Failed - Triglycerides in normal range and within 360 days    Triglycerides  Date Value Ref Range Status  06/18/2020 180 (H) 0 - 149 mg/dL Final         Passed - Total Cholesterol in normal range and within 360 days    Cholesterol, Total  Date Value Ref Range Status  06/18/2020 148 100 - 199 mg/dL Final         Passed - LDL in normal range and within  360 days    LDL Chol Calc (NIH)  Date Value Ref Range Status  06/18/2020 84 0 - 99 mg/dL Final         Passed - Patient is not pregnant      Passed - Valid encounter within last 12 months    Recent Outpatient Visits          4 months ago [place], Jennifer M, PA-C   8 months ago Allergic reaction, initial encounter   [facility] Birdie Sons, MD   8 months ago [place], Donald E, MD   9 months ago Viral URI with cough   [facility], [place]   1 year ago [place], Donald E, MD

## 2021-04-26 ENCOUNTER — Ambulatory Visit (INDEPENDENT_AMBULATORY_CARE_PROVIDER_SITE_OTHER): Payer: Medicare Other

## 2021-04-26 DIAGNOSIS — Z0000 Encounter for general adult medical examination without abnormal findings: Secondary | ICD-10-CM

## 2021-04-26 NOTE — Progress Notes (Signed)
Subjective:   Deanna Weaver is a 75 y.o. female who presents for Medicare Annual (Subsequent) preventive examination.  I connected with  Deanna Weaver on 04/26/21 by a video enabled telemedicine application and verified that I am speaking with the correct person using two identifiers.   I discussed the limitations of evaluation and management by telemedicine. The patient expressed understanding and agreed to proceed.  Location of patient: Home  Location of provider: Office  Persons participating in visit: Deanna Weaver (patient) and Lanier Prude, CMA/RT(R)(BD)  Review of Systems    Defer to PCP       Objective:    There were no vitals filed for this visit. There is no height or weight on file to calculate BMI.  Advanced Directives 04/04/2020 10/09/2019 04/24/2019 03/29/2019 03/27/2019 01/16/2019 10/27/2017  Does Patient Have a Medical Advance Directive? No No No No No No No  Would patient like information on creating a medical advance directive? No - Patient declined No - Patient declined No - Patient declined No - Patient declined No - Patient declined Yes (MAU/Ambulatory/Procedural Areas - Information given) No - Patient declined    Current Medications (verified) Outpatient Encounter Medications as of 04/26/2021  Medication Sig   ciprofloxacin-dexamethasone (CIPRODEX) OTIC suspension Place 4 drops into the right ear 2 (two) times daily. X 5-7 days   clotrimazole-betamethasone (LOTRISONE) cream APPLY 1 APPLICATION TOPICALLY 2 (TWO) TIMES DAILY.   estradiol (ESTRACE VAGINAL) 0.1 MG/GM vaginal cream Place 1 Applicatorful vaginally 2 (two) times a week.   fluticasone (FLONASE) 50 MCG/ACT nasal spray PLACE 2 SPRAYS INTO BOTH NOSTRILS DAILY. 2(TWO) NASAL EACH NOSTRIL ONCE A DAY   latanoprost (XALATAN) 0.005 % ophthalmic solution Place 1 drop into both eyes at bedtime.   lisinopril (ZESTRIL) 5 MG tablet TAKE 1 TABLET BY MOUTH EVERY DAY   montelukast (SINGULAIR) 10 MG tablet Take 1 tablet (10 mg  total) by mouth daily. For allergies   Multiple Vitamins-Minerals (PRESERVISION AREDS 2 PO) Take by mouth 2 (two) times daily.    Omega-3 Fatty Acids (FISH OIL) 1000 MG CAPS Take 4 capsules by mouth daily.   omeprazole (PRILOSEC) 40 MG capsule TAKE 1 CAPSULE BY MOUTH EVERY DAY   simvastatin (ZOCOR) 40 MG tablet TAKE 1 TABLET BY MOUTH EVERYDAY AT BEDTIME   No facility-administered encounter medications on file as of 04/26/2021.    Allergies (verified) Celecoxib, Metoprolol, Preservision areds [actical], Cefdinir, and Penicillins   History: Past Medical History:  Diagnosis Date   Allergic rhinitis    Allergy    Arthritis    lower back   Hyperlipidemia    Hypertension    Past Surgical History:  Procedure Laterality Date   ABDOMINAL HYSTERECTOMY     CATARACT EXTRACTION W/ INTRAOCULAR LENS IMPLANT Right    CATARACT EXTRACTION W/PHACO Right 01/16/2019   Procedure: CATARACT EXTRACTION PHACO AND INTRAOCULAR LENS PLACEMENT ([place]) RIGHT;  Surgeon: Eulogio Bear, MD;  Location: [facility];  Service: Ophthalmology;  Laterality: Right;   CATARACT EXTRACTION W/PHACO Left 03/27/2019   Procedure: CATARACT EXTRACTION PHACO AND INTRAOCULAR LENS PLACEMENT ([place]) LEFT;  Surgeon: Eulogio Bear, MD;  Location: [facility];  Service: Ophthalmology;  Laterality: Left;   COLONOSCOPY     COLONOSCOPY WITH PROPOFOL N/A 04/24/2019   Procedure: COLONOSCOPY WITH PROPOFOL;  Surgeon: Lucilla Lame, MD;  Location: [facility];  Service: Endoscopy;  Laterality: N/A;   FLEXIBLE SIGMOIDOSCOPY N/A 10/09/2019   Procedure: FLEXIBLE SIGMOIDOSCOPY WITH BIOPSY;  Surgeon: Lucilla Lame, MD;  Location: [facility];  Service: Endoscopy;  Laterality: N/A;   FOOT SURGERY     POLYPECTOMY  04/24/2019   Procedure: POLYPECTOMY;  Surgeon: Lucilla Lame, MD;  Location: [facility];  Service: Endoscopy;;   POLYPECTOMY N/A 10/09/2019   Procedure: POLYPECTOMY;  Surgeon: Lucilla Lame, MD;   Location: [facility];  Service: Endoscopy;  Laterality: N/A;   VAGINAL HYSTERECTOMY  1990   Family History  Problem Relation Age of Onset   Cancer Mother    Heart disease Father    Social History   Socioeconomic History   Marital status: Married    Spouse name: Not on file   Number of children: 4   Years of education: Not on file   Highest education level: 11th grade  Occupational History   Occupation: Retired    Comment: keeps granchildren  Tobacco Use   Smoking status: Never   Smokeless tobacco: Never  Vaping Use   Vaping Use: Never used  Substance and Sexual Activity   Alcohol use: Never   Drug use: No   Sexual activity: Not Currently  Other Topics Concern   Not on file  Social History Narrative   ** Merged History Encounter **       Social Determinants of Health   Financial Resource Strain: Low Risk    Difficulty of Paying Living Expenses: Not hard at all  Food Insecurity: No Food Insecurity   Worried About Charity fundraiser in the Last Year: Never true   [place] in the Last Year: Never true  Transportation Needs: No Transportation Needs   Lack of Transportation (Medical): No   Lack of Transportation (Non-Medical): No  Physical Activity: Sufficiently Active   Days of Exercise per Week: 7 days   Minutes of Exercise per Session: 60 min  Stress: No Stress Concern Present   Feeling of Stress : Not at all  Social Connections: Moderately Integrated   Frequency of Communication with Friends and Family: More than three times a week   Frequency of Social Gatherings with Friends and Family: More than three times a week   Attends Religious Services: 1 to 4 times per year   Active Member of [employer] or Organizations: No   Attends Archivist Meetings: Never   Marital Status: Married    Tobacco Counseling: N/A patient is nonsmoker    Clinical Intake:  Pre-visit preparation completed: Yes  Pain : No/denies pain     Diabetes:  No  How often do you need to have someone help you when you read instructions, pamphlets, or other written materials from your doctor or pharmacy?: 1 - Never  Diabetic? no  Interpreter Needed?: No      Activities of Daily Living In your present state of health, do you have any difficulty performing the following activities: 04/26/2021 10/17/2020  Hearing? Y Y  Comment slight hearing trouble -  Vision? N N  Difficulty concentrating or making decisions? N N  Walking or climbing stairs? N N  Dressing or bathing? N N  Doing errands, shopping? N N  Some recent data might be hidden    Patient Care Team: Birdie Sons, MD as PCP - General (Family Medicine) Eulogio Bear, MD as Consulting Physician (Ophthalmology) Lucilla Lame, MD as Consulting Physician (Gastroenterology)  Indicate any recent Medical Services you may have received from other than [facility] providers in the past year (date may be approximate).     Assessment:   This is  a routine wellness examination for Cherylynn.  Hearing/Vision screen No results found.  Dietary issues and exercise activities discussed: Current Exercise Habits: The patient has a physically strenuous job, but has no regular exercise apart from work. 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 0 0 0 0 0  Comment - - - - -  Number falls in past yr: 0 0 0 0 0  Comment - - - - -  Injury with Fall? 0 0 0 0 0  Risk for fall due to : No Fall Risks No Fall Risks - - -  Follow up Falls evaluation completed Falls evaluation completed Falls evaluation completed - -   Current Exercise Habits: The patient has a physically strenuous  job, but has no regular exercise apart from work. 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 Yes  If so, are there any without handrails? Yes  Home free of loose throw rugs in walkways, pet beds, electrical cords, etc? No  Adequate lighting in your home to reduce risk of falls? Yes   ASSISTIVE DEVICES UTILIZED TO PREVENT FALLS:  Life alert? No  Use of a cane, walker or w/c? No  Grab bars in the bathroom? Yes  Shower chair or bench in shower? No  Elevated toilet seat or a handicapped toilet? No   TIMED UP AND GO:  Was the test performed?  N/A .  Length of time to ambulate 10 feet: N/A sec.   These test cannont be done in telehealth encounter.  Cognitive Function:     6CIT Screen 04/26/2021 10/23/2016  What Year? 0 points 4 points  What month? 0 points 0 points  What time? 0 points 0 points  Count back from 20 0 points 0 points  Months in reverse 0 points 0 points  Repeat phrase 2 points 2 points  Total Score 2 6    Immunizations Immunization History  Administered Date(s) Administered   Influenza,inj,Quad PF,6+ Mos 05/30/2019   Pneumococcal Conjugate-13 11/03/2013   Pneumococcal Polysaccharide-23 10/23/2016    TDAP status: Due, Education has been provided regarding the importance of this vaccine. Advised may receive this vaccine at local pharmacy or Health Dept. Aware to provide a copy of the vaccination record if obtained from local pharmacy or Health Dept. Verbalized acceptance and understanding.  Flu Vaccine status: Due, Education has been provided regarding the importance of this vaccine. Advised may receive this vaccine at local pharmacy or Health Dept. Aware to provide a copy of the vaccination record if obtained from local pharmacy or Health Dept. Verbalized acceptance and understanding.  Pneumococcal vaccine status: Up to date  Covid-19  vaccine status: Declined, Education has been provided regarding the importance of this vaccine but patient still declined. Advised may receive this vaccine at local pharmacy or Health Dept.or vaccine clinic. Aware to provide a copy of the vaccination record if obtained from local pharmacy or Health Dept. Verbalized acceptance and understanding.  Qualifies for Shingles Vaccine? No  Zostavax completed No   Shingrix Completed?: No.    Education has been provided regarding the importance of this vaccine. Patient has been advised to call insurance company to determine out of pocket expense if they have not yet received this vaccine. Advised may also receive vaccine at local pharmacy or Health Dept. Verbalized acceptance and understanding.  Screening Tests Health Maintenance  Topic Date Due   COVID-19 Vaccine (1) Never done   Zoster Vaccines- Shingrix (1 of 2) Never done   INFLUENZA VACCINE  03/03/2021   TETANUS/TDAP  08/03/2026 (Originally 05/01/1965)   MAMMOGRAM  01/10/2023   COLONOSCOPY (Pts 45-26yrs Insurance coverage will need to be confirmed)  04/23/2024   DEXA SCAN  Completed   Hepatitis C Screening  Completed   HPV VACCINES  Aged Out    Health Maintenance  Health Maintenance Due  Topic Date Due   COVID-19 Vaccine (1) Never done   Zoster Vaccines- Shingrix (1 of 2) Never done   INFLUENZA VACCINE  03/03/2021    Colorectal cancer screening: Type of screening: Colonoscopy. Completed 04/24/2019. Repeat every 5 years  Mammogram status: Completed 01/09/2021. Repeat every year  Bone Density status: Completed 12/12/2017. Results reflect: Bone density results: NORMAL. Repeat every 4-5 years.  Lung Cancer Screening: (Low Dose CT Chest recommended if Age 88-80 years, 30 pack-year currently smoking OR have quit w/in 15years.) does not qualify.   Lung Cancer Screening Referral: N/A  Additional Screening:  Hepatitis C Screening: does qualify; Completed 10/23/2016  Vision Screening:  Recommended annual ophthalmology exams for early detection of glaucoma and other disorders of the eye. Is the patient up to date with their annual eye exam?  Yes  Who is the provider or what is the name of the office in which the patient attends annual eye exams? Dr. Benay Pillow If pt is not established with a provider, would they like to be referred to a provider to establish care? No .   Dental Screening: Recommended annual dental exams for proper oral hygiene  Community Resource Referral / Chronic Care Management: CRR required this visit?  No   CCM required this visit?  No      Plan:     I have personally reviewed and noted the following in the patient's chart:   Medical and social history Use of alcohol, tobacco or illicit drugs  Current medications and supplements including opioid prescriptions.  Functional ability and status Nutritional status Physical activity Advanced directives List of other physicians Hospitalizations, surgeries, and ER visits in previous 12 months Vitals Screenings to include cognitive, depression, and falls Referrals and appointments  In addition, I have reviewed and discussed with patient certain preventive protocols, quality metrics, and best practice recommendations. A written personalized care plan for preventive services as well as general preventive health recommendations were provided to patient.     Jerilee Field, RT   04/26/2021   Nurse Notes: 30 min non face to face visit. Patient will obtain AVS through [facility].

## 2021-05-22 DIAGNOSIS — H401131 Primary open-angle glaucoma, bilateral, mild stage: Secondary | ICD-10-CM | POA: Diagnosis not present

## 2021-05-26 ENCOUNTER — Ambulatory Visit: Payer: Self-pay

## 2021-05-26 ENCOUNTER — Encounter: Payer: Self-pay | Admitting: Emergency Medicine

## 2021-05-26 ENCOUNTER — Ambulatory Visit
Admission: EM | Admit: 2021-05-26 | Discharge: 2021-05-26 | Disposition: A | Discharge status: Home / Self Care | Payer: Medicare Other | Attending: Internal Medicine | Admitting: Internal Medicine

## 2021-05-26 ENCOUNTER — Other Ambulatory Visit: Payer: Self-pay

## 2021-05-26 DIAGNOSIS — H9201 Otalgia, right ear: Secondary | ICD-10-CM | POA: Diagnosis not present

## 2021-05-26 MED ORDER — DOXYCYCLINE HYCLATE 100 MG PO CAPS: 100.0000 mg | ORAL_CAPSULE | Freq: Two times a day (BID) | ORAL | 0 refills | Status: AC

## 2021-05-26 NOTE — Discharge Instructions (Addendum)
Please take medications as prescribed If pain worsens please return to urgent care to be reevaluated.

## 2021-05-26 NOTE — ED Triage Notes (Signed)
Pt c/o right ear pain that started yesterday

## 2021-05-26 NOTE — Telephone Encounter (Signed)
Returned pt's call from earlier.   Pt has severe ear pain starting yesterday.  Pt states that this happens 2x per year and Dr. Caryn Section gives her antibiotics, which clears up the problem.   No appointments available. Called office during lunch. Tried to make a virtual care appointment. Pt does not have a computer available, has no email  address and cannot remember her cell number.    Pt states that ear pain is unbearable.  Pt would like Rx called in.      Reason for Disposition  [1] SEVERE pain AND [2] not improved 2 hours after taking analgesic medication (e.g., ibuprofen or acetaminophen)  Answer Assessment - Initial Assessment Questions 1. LOCATION: "Which ear is involved?"     left 2. ONSET: "When did the ear start hurting"      Yesterday 3. SEVERITY: "How bad is the pain?"  (Scale 1-10; mild, moderate or severe)   - MILD (1-3): doesn't interfere with normal activities    - MODERATE (4-7): interferes with normal activities or awakens from sleep    - SEVERE (8-10): excruciating pain, unable to do any normal activities      8 4. URI SYMPTOMS: "Do you have a runny nose or cough?"     no 5. FEVER: "Do you have a fever?" If Yes, ask: "What is your temperature, how was it measured, and when did it start?"     no 6. CAUSE: "Have you been swimming recently?", "How often do you use Q-TIPS?", "Have you had any recent air travel or scuba diving?"     no 7. OTHER SYMPTOMS: "Do you have any other symptoms?" (e.g., headache, stiff neck, dizziness, vomiting, runny nose, decreased hearing)     no 8. PREGNANCY: "Is there any chance you are pregnant?" "When was your last menstrual period?"     no  Protocols used: Earache-A-AH

## 2021-05-26 NOTE — ED Provider Notes (Signed)
Deanna Weaver    CSN: [id number] Arrival date & time: 05/26/21  1656      History   Chief Complaint Chief Complaint  Patient presents with   Otalgia    HPI Deanna Weaver is a 75 y.o. female comes to the urgent care with right ear pain of a few hours duration.  Patient denies trauma to the ear.  She denies ringing and difficulty hearing out of the ear.  No dizziness.  Pain radiates to the right side of the neck.  No nausea or vomiting.  No dizziness.   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    Past Surgical History:  Procedure Laterality Date   ABDOMINAL HYSTERECTOMY     CATARACT EXTRACTION W/ INTRAOCULAR LENS IMPLANT Right    CATARACT EXTRACTION W/PHACO Right 01/16/2019   Procedure: CATARACT EXTRACTION PHACO AND INTRAOCULAR LENS PLACEMENT ([place]) RIGHT;  Surgeon: Eulogio Bear, MD;  Location: [facility];  Service: Ophthalmology;  Laterality: Right;   CATARACT EXTRACTION W/PHACO Left 03/27/2019   Procedure: CATARACT EXTRACTION PHACO AND INTRAOCULAR LENS PLACEMENT ([place]) LEFT;  Surgeon: Eulogio Bear, MD;  Location: [facility];  Service: Ophthalmology;  Laterality: Left;   COLONOSCOPY     COLONOSCOPY WITH PROPOFOL N/A 04/24/2019   Procedure: COLONOSCOPY WITH PROPOFOL;  Surgeon: Lucilla Lame, MD;  Location: [facility];  Service: Endoscopy;  Laterality: N/A;   FLEXIBLE SIGMOIDOSCOPY N/A  10/09/2019   Procedure: FLEXIBLE SIGMOIDOSCOPY WITH BIOPSY;  Surgeon: Lucilla Lame, MD;  Location: [facility];  Service: Endoscopy;  Laterality: N/A;   FOOT SURGERY     POLYPECTOMY  04/24/2019   Procedure: POLYPECTOMY;  Surgeon: Lucilla Lame, MD;  Location: [facility];  Service: Endoscopy;;   POLYPECTOMY N/A 10/09/2019   Procedure: POLYPECTOMY;  Surgeon: Lucilla Lame, MD;  Location: [facility];  Service: Endoscopy;  Laterality: N/A;   VAGINAL HYSTERECTOMY  1990    OB History   No obstetric history on file.      Home Medications    Prior to Admission medications   Medication Sig Start Date End Date Taking? Authorizing Provider  doxycycline (VIBRAMYCIN) 100 MG capsule Take 1 capsule (100 mg total) by mouth 2 (two) times daily for 5 days. 05/26/21 05/31/21 Yes Jefrey Raburn, Myrene Galas, MD  ciprofloxacin-dexamethasone (CIPRODEX) OTIC suspension Place 4 drops into the right ear 2 (two) times daily. X 5-7 days 10/17/20   Mar Daring, PA-C  clotrimazole-betamethasone (LOTRISONE) cream APPLY 1 APPLICATION TOPICALLY 2 (TWO) TIMES DAILY. 01/24/17   Birdie Sons, MD  estradiol (ESTRACE VAGINAL) 0.1 MG/GM vaginal cream Place 1 Applicatorful vaginally 2 (two) times a week. 10/17/20   Mar Daring, PA-C  fluticasone (FLONASE) 50 MCG/ACT nasal spray PLACE 2 SPRAYS INTO BOTH NOSTRILS DAILY. 2(TWO) NASAL EACH NOSTRIL ONCE A DAY 01/21/20  Birdie Sons, MD  latanoprost (XALATAN) 0.005 % ophthalmic solution Place 1 drop into both eyes at bedtime.    [provider]  lisinopril (ZESTRIL) 5 MG tablet TAKE 1 TABLET BY MOUTH EVERY DAY 01/14/21   Birdie Sons, MD  montelukast (SINGULAIR) 10 MG tablet Take 1 tablet (10 mg total) by mouth daily. For allergies 12/19/20   Birdie Sons, MD  Multiple Vitamins-Minerals (PRESERVISION AREDS 2 PO) Take by mouth 2 (two) times daily.     [provider]  Omega-3 Fatty Acids (FISH OIL) 1000 MG CAPS Take 4 capsules  by mouth daily.    [provider]  omeprazole (PRILOSEC) 40 MG capsule TAKE 1 CAPSULE BY MOUTH EVERY DAY 09/09/20   Birdie Sons, MD  simvastatin (ZOCOR) 40 MG tablet TAKE 1 TABLET BY MOUTH EVERYDAY AT BEDTIME 03/08/21   Birdie Sons, MD    Family History Family History  Problem Relation Age of Onset   Cancer Mother    Heart disease Father     Social History Social History   Tobacco Use   Smoking status: Never   Smokeless tobacco: Never  Vaping Use   Vaping Use: Never used  Substance Use Topics   Alcohol use: Never   Drug use: No     Allergies   Celecoxib, Metoprolol, Preservision areds [actical], Cefdinir, and Penicillins   Review of Systems Review of Systems  HENT:  Positive for ear pain. Negative for ear discharge, hearing loss, sinus pressure and sore throat.     Physical Exam Triage Vital Signs ED Triage Vitals  Enc Vitals Group     BP 05/26/21 1742 (!) 162/86     Pulse Rate 05/26/21 1742 66     Resp --      Temp 05/26/21 1742 98.3 F (36.8 C)     Temp Source 05/26/21 1742 Oral     SpO2 05/26/21 1742 95 %     Weight --      Height --      Head Circumference --      Peak Flow --      Pain Score 05/26/21 1743 9     Pain Loc --      Pain Edu? --      Excl. in [place]? --    No data found.  Updated Vital Signs BP (!) 162/86 (BP Location: Left Arm)   Pulse 66   Temp 98.3 F (36.8 C) (Oral)   SpO2 95%   Visual Acuity Right Eye Distance:   Left Eye Distance:   Bilateral Distance:    Right Eye Near:   Left Eye Near:    Bilateral Near:     Physical Exam Vitals and nursing note reviewed.  Constitutional:      General: She is in acute distress.     Appearance: Normal appearance. She is not ill-appearing.  HENT:     Ears:     Comments: Mild erythema on the right tympanic membrane.  No middle ear effusion.  Tympanic membrane on the left side is normal. Cardiovascular:     Rate and Rhythm: Normal rate and regular rhythm.   Neurological:     Mental Status: She is alert.     UC Treatments / Results  Labs (all labs ordered are listed, but only abnormal results are displayed) Labs Reviewed - No data to display  EKG   Radiology No results found.  Procedures Procedures (including critical care time)  Medications Ordered in  UC Medications - No data to display  Initial Impression / Assessment and Plan / UC Course  I have reviewed the triage vital signs and the nursing notes.  Pertinent labs & imaging results that were available during my care of the patient were reviewed by me and considered in my medical decision making (see chart for details).     1.  Right ear pain: Doxycycline 100 mg twice daily for 5 days Ibuprofen as needed for pain If you have worsening symptoms please return to urgent care to be reevaluated. Final Clinical Impressions(s) / UC Diagnoses   Final diagnoses:  Otalgia of right ear     Discharge Instructions      Please take medications as prescribed If pain worsens please return to urgent care to be reevaluated.   ED Prescriptions     Medication Sig Dispense Auth. Provider   doxycycline (VIBRAMYCIN) 100 MG capsule Take 1 capsule (100 mg total) by mouth 2 (two) times daily for 5 days. 10 capsule Jadarrius Maselli, Myrene Galas, MD      PDMP not reviewed this encounter.   Chase Picket, MD 05/26/21 [phone]

## 2021-05-26 NOTE — Telephone Encounter (Signed)
I called and spoke with patient and advised her that she needs an office visit. I offered to work patient in right now with Dr. Caryn Section since he had a late cancellation on his schedule. Patient says she is too far out and wouldn't be able to make it in time. I recommended that patient try [facility] urgent care. Patient agreed to go to urgent care for evaluation. I gave her their address and contact information.

## 2021-05-29 ENCOUNTER — Ambulatory Visit: Payer: Self-pay | Admitting: *Deleted

## 2021-05-29 NOTE — Telephone Encounter (Signed)
Patient states she was seen at [facility] and treated with antibiotic for ear ache. Patient reports she is still having pain and doesn't see very much improvement at all- patient is requesting OV. 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 Patient wants to come to office tomorrow. Advised patient no open appointments. Patient dos not want to go back to UC- she thinks they did not give her the correct treatment.

## 2021-05-29 NOTE — Telephone Encounter (Signed)
Reason for Disposition  [1] Taking antibiotic > 72 hours (3 days) and [2] pain persists or recurs  Answer Assessment - Initial Assessment Questions 1. ANTIBIOTIC: "What antibiotic are you taking?" "How many times per day?"     Doxycycline 100 mg bid 2. ONSET: "When was the antibiotic started?"     10/24 3. LOCATION: "Which ear is involved?"     R ear 4. PAIN: "How bad is the pain?"   (Scale 1-10; mild, moderate or severe)   - MILD (1-3): doesn't interfere with normal activities    - MODERATE (4-7): interferes with normal activities or awakens from sleep    - SEVERE (8-10): excruciating pain, unable to do any normal activities      Moderate/severe 5. FEVER: "Do you have a fever?" If Yes, ask: "What is your temperature, how was it measured, and when did it start?"     no 6. DISCHARGE: "Is there any discharge from the ear?"     no 7. OTHER SYMPTOMS: "Do you have any other symptoms?" (e.g., headache, stiff neck, dizziness, vomiting, runny nose)     Lymph node was swollen- has decreased some 8. PREGNANCY: "Is there any chance you are pregnant?" "When was your last menstrual period?"     na  Protocols used: Ear - Otitis Media Follow-up Call-A-AH

## 2021-06-04 ENCOUNTER — Other Ambulatory Visit: Payer: Self-pay | Admitting: Family Medicine

## 2021-06-05 NOTE — Telephone Encounter (Signed)
Requested medications are due for refill today.  yeys  Requested medications are on the active medications list.  yes  Last refill. 01/14/2021  Future visit scheduled.   no  Notes to clinic.  Labs are expired.

## 2021-06-18 ENCOUNTER — Other Ambulatory Visit: Payer: Self-pay | Admitting: Family Medicine

## 2021-06-18 NOTE — Telephone Encounter (Signed)
Requested Prescriptions  Pending Prescriptions Disp Refills  . fluticasone (FLONASE) 50 MCG/ACT nasal spray [Pharmacy Med Name: FLUTICASONE PROP 50 MCG SPRAY] 48 mL 0    Sig: PLACE 2 SPRAYS INTO BOTH NOSTRILS DAILY. 2(TWO) NASAL EACH NOSTRIL ONCE A DAY     Ear, Nose, and Throat: Nasal Preparations - Corticosteroids Passed - 06/18/2021  9:18 AM      Passed - Valid encounter within last 12 months    Recent Outpatient Visits          8 months ago [place], Jennifer M, [place]   11 months ago Allergic reaction, initial encounter   [facility] Birdie Sons, MD   1 year ago [place], Donald E, [place]   1 year ago Viral URI with cough   [facility] Trinna Post, [place]   1 year ago [place], Donald E, MD

## 2021-07-07 ENCOUNTER — Ambulatory Visit (INDEPENDENT_AMBULATORY_CARE_PROVIDER_SITE_OTHER): Payer: Medicare Other | Admitting: Family Medicine

## 2021-07-07 ENCOUNTER — Encounter: Payer: Self-pay | Admitting: Family Medicine

## 2021-07-07 ENCOUNTER — Other Ambulatory Visit: Payer: Self-pay

## 2021-07-07 VITALS — BP 142/68 | HR 80 | Temp 99.1°F

## 2021-07-07 DIAGNOSIS — U071 COVID-19: Secondary | ICD-10-CM

## 2021-07-07 MED ORDER — NIRMATRELVIR/RITONAVIR (PAXLOVID) TABLET (RENAL DOSING): 2.0000 | ORAL_TABLET | Freq: Two times a day (BID) | ORAL | 0 refills | Status: AC

## 2021-07-07 NOTE — Progress Notes (Signed)
Established patient visit   Patient: Deanna Weaver   DOB: 06/24/1946   75 y.o. Female  MRN: [id number] Visit Date: 07/07/2021  Today's healthcare provider: Lelon Huh, MD   No chief complaint on file.  Subjective    HPI  Tested positive for Covid 07/07/2021  Symptoms started 07/05/2021. Has not had covid vaccines, not had Covid infection that she is aware of. Not had flu vaccine.   Symptoms:  Cough Fatigue Body aches Fever 100.4 Sinus pain/pressure   Denies:  Shortness of Breath Wheezing GI complains  Ear pain      Medications: Outpatient Medications Prior to Visit  Medication Sig   ciprofloxacin-dexamethasone (CIPRODEX) OTIC suspension Place 4 drops into the right ear 2 (two) times daily. X 5-7 days   clotrimazole-betamethasone (LOTRISONE) cream APPLY 1 APPLICATION TOPICALLY 2 (TWO) TIMES DAILY.   estradiol (ESTRACE VAGINAL) 0.1 MG/GM vaginal cream Place 1 Applicatorful vaginally 2 (two) times a week.   fluticasone (FLONASE) 50 MCG/ACT nasal spray PLACE 2 SPRAYS INTO BOTH NOSTRILS DAILY. 2(TWO) NASAL EACH NOSTRIL ONCE A DAY   latanoprost (XALATAN) 0.005 % ophthalmic solution Place 1 drop into both eyes at bedtime.   lisinopril (ZESTRIL) 5 MG tablet TAKE 1 TABLET BY MOUTH EVERY DAY   montelukast (SINGULAIR) 10 MG tablet Take 1 tablet (10 mg total) by mouth daily. For allergies   Multiple Vitamins-Minerals (PRESERVISION AREDS 2 PO) Take by mouth 2 (two) times daily.    Omega-3 Fatty Acids (FISH OIL) 1000 MG CAPS Take 4 capsules by mouth daily.   omeprazole (PRILOSEC) 40 MG capsule TAKE 1 CAPSULE BY MOUTH EVERY DAY   simvastatin (ZOCOR) 40 MG tablet TAKE 1 TABLET BY MOUTH EVERYDAY AT BEDTIME   No facility-administered medications prior to visit.    Review of Systems  Constitutional:  Positive for fatigue and fever. Negative for activity change, appetite change, chills, diaphoresis and unexpected weight change.  HENT:  Positive for congestion, sinus  pressure, sinus pain and voice change. Negative for postnasal drip, rhinorrhea, sore throat and tinnitus.   Eyes: Negative.   Respiratory: Negative.    Gastrointestinal: Negative.   Neurological:  Negative for dizziness, light-headedness and headaches.      Objective    BP (!) 142/68 (BP Location: Right Arm, Patient Position: Sitting, Cuff Size: Large)   Pulse 80   Temp 99.1 F (37.3 C) (Oral)   SpO2 99%   Physical Exam   General: Appearance:     Well developed, well nourished female in no acute distress  Eyes:    PERRL, conjunctiva/corneas clear, EOM's intact       Lungs:     Clear to auscultation bilaterally, respirations unlabored  Heart:    Normal heart rate. Normal rhythm. No murmurs, rubs, or gallops.    MS:   All extremities are intact.    Neurologic:   Awake, alert, oriented x 3. No apparent focal neurological defect.         Assessment & Plan     1. COVID-19  - nirmatrelvir/ritonavir EUA, renal dosing, (PAXLOVID) 10 x 150 MG & 10 x 100MG  TABS; Take 2 tablets by mouth 2 (two) times daily for 5 days. (Take nirmatrelvir 150 mg one tablet twice daily for 5 days and ritonavir 100 mg one tablet twice daily for 5 days) Patient GFR is 59  Dispense: 20 tablet; Refill: 0   Counseled on natural history of Covid infections including rebound symptoms. Counseled on other prevalent respiratory infections including  influenza and RSV. Call if symptoms change or if not rapidly improving.         The entirety of the information documented in the History of Present Illness, Review of Systems and Physical Exam were personally obtained by me. Portions of this information were initially documented by the CMA and reviewed by me for thoroughness and accuracy.     Lelon Huh, MD  [facility] [phone] (phone) [phone] (fax)  [place]

## 2021-07-26 DIAGNOSIS — R300 Dysuria: Secondary | ICD-10-CM | POA: Diagnosis not present

## 2021-07-26 DIAGNOSIS — N390 Urinary tract infection, site not specified: Secondary | ICD-10-CM | POA: Diagnosis not present

## 2021-07-26 DIAGNOSIS — R350 Frequency of micturition: Secondary | ICD-10-CM | POA: Diagnosis not present

## 2021-08-13 DIAGNOSIS — D2272 Melanocytic nevi of left lower limb, including hip: Secondary | ICD-10-CM | POA: Diagnosis not present

## 2021-08-13 DIAGNOSIS — D2262 Melanocytic nevi of left upper limb, including shoulder: Secondary | ICD-10-CM | POA: Diagnosis not present

## 2021-08-13 DIAGNOSIS — D2271 Melanocytic nevi of right lower limb, including hip: Secondary | ICD-10-CM | POA: Diagnosis not present

## 2021-08-13 DIAGNOSIS — X32XXXA Exposure to sunlight, initial encounter: Secondary | ICD-10-CM | POA: Diagnosis not present

## 2021-08-13 DIAGNOSIS — L570 Actinic keratosis: Secondary | ICD-10-CM | POA: Diagnosis not present

## 2021-08-13 DIAGNOSIS — R202 Paresthesia of skin: Secondary | ICD-10-CM | POA: Diagnosis not present

## 2021-08-13 DIAGNOSIS — D2261 Melanocytic nevi of right upper limb, including shoulder: Secondary | ICD-10-CM | POA: Diagnosis not present

## 2021-08-13 DIAGNOSIS — D225 Melanocytic nevi of trunk: Secondary | ICD-10-CM | POA: Diagnosis not present

## 2021-08-13 DIAGNOSIS — L821 Other seborrheic keratosis: Secondary | ICD-10-CM | POA: Diagnosis not present

## 2021-09-01 ENCOUNTER — Other Ambulatory Visit: Payer: Self-pay | Admitting: Family Medicine

## 2021-09-15 ENCOUNTER — Other Ambulatory Visit: Payer: Self-pay | Admitting: Family Medicine

## 2021-09-16 ENCOUNTER — Other Ambulatory Visit: Payer: Self-pay

## 2021-09-16 ENCOUNTER — Ambulatory Visit (INDEPENDENT_AMBULATORY_CARE_PROVIDER_SITE_OTHER): Payer: Medicare Other | Admitting: Family Medicine

## 2021-09-16 ENCOUNTER — Encounter: Payer: Self-pay | Admitting: Family Medicine

## 2021-09-16 VITALS — BP 150/72 | HR 77 | Temp 98.4°F | Resp 16 | Wt 151.1 lb

## 2021-09-16 DIAGNOSIS — J069 Acute upper respiratory infection, unspecified: Secondary | ICD-10-CM | POA: Diagnosis not present

## 2021-09-16 MED ORDER — METHYLPREDNISOLONE 4 MG PO TBPK: ORAL_TABLET | ORAL | 0 refills | Status: DC

## 2021-09-16 NOTE — Progress Notes (Signed)
°  ° ° °  Established patient visit   Patient: Deanna Weaver   DOB: September 18, 1945   76 y.o. Female  MRN: [id number] Visit Date: 09/16/2021  Today's healthcare provider: Myles Gip, DO   Chief Complaint  Patient presents with   Sinus Problem   Subjective    Sinus Problem This is a new problem. The current episode started in the past 7 days. The problem has been waxing and waning since onset. There has been no fever. Associated symptoms include coughing (productive yellow/green phlegm), ear pain (left side), sinus pressure and a sore throat. Pertinent negatives include no chills, congestion, diaphoresis, headaches, hoarse voice, neck pain, shortness of breath, sneezing or swollen glands. Past treatments include acetaminophen (ear drops, Mucinex).  helped some but worse at night. Symptom onset Saturday. Has not tested for COVID but had recent [place] in December. No sick contacts.  Medications: Outpatient Medications Prior to Visit  Medication Sig   ciprofloxacin-dexamethasone (CIPRODEX) OTIC suspension Place 4 drops into the right ear 2 (two) times daily. X 5-7 days   clotrimazole-betamethasone (LOTRISONE) cream APPLY 1 APPLICATION TOPICALLY 2 (TWO) TIMES DAILY.   estradiol (ESTRACE VAGINAL) 0.1 MG/GM vaginal cream Place 1 Applicatorful vaginally 2 (two) times a week.   fluticasone (FLONASE) 50 MCG/ACT nasal spray PLACE 2 SPRAYS INTO BOTH NOSTRILS DAILY. 2(TWO) NASAL EACH NOSTRIL ONCE A DAY   latanoprost (XALATAN) 0.005 % ophthalmic solution Place 1 drop into both eyes at bedtime.   lisinopril (ZESTRIL) 5 MG tablet TAKE 1 TABLET BY MOUTH EVERY DAY   montelukast (SINGULAIR) 10 MG tablet Take 1 tablet (10 mg total) by mouth daily. For allergies   Multiple Vitamins-Minerals (PRESERVISION AREDS 2 PO) Take by mouth 2 (two) times daily.    Omega-3 Fatty Acids (FISH OIL) 1000 MG CAPS Take 4 capsules by mouth daily.   omeprazole (PRILOSEC) 40 MG capsule TAKE 1 CAPSULE BY MOUTH EVERY DAY    simvastatin (ZOCOR) 40 MG tablet TAKE 1 TABLET BY MOUTH EVERYDAY AT BEDTIME   No facility-administered medications prior to visit.    Review of Systems  Constitutional:  Negative for chills and diaphoresis.  HENT:  Positive for ear pain (left side), sinus pressure and sore throat. Negative for congestion, hoarse voice and sneezing.   Respiratory:  Positive for cough (productive yellow/green phlegm). Negative for shortness of breath.   Musculoskeletal:  Negative for neck pain.  Neurological:  Negative for headaches.      Objective    BP (!) 150/72    Pulse 77    Temp 98.4 F (36.9 C) (Oral)    Resp 16    Wt 151 lb 1.6 oz (68.5 kg)    SpO2 100%    BMI 28.55 kg/m  {Show previous vital signs (optional):23777}  Physical Exam  Gen: well appearing, in NAD HEENT: orophyarynx clear, erythematous without exudate. Uvula midline. No tonsillar enlargement. Good dentition. TM visible b/l without bulging, erythema, purulence. Air fluid level present. No cervical or supraclavicular lymphadenopathy. Card: RRR Lungs: CTAB Ext: WWP, no edema   No results found for any visits on 09/16/21.  Assessment & Plan     URI Mild-mod sx. Will treat with steroid taper given minimal relief with decongestant. Reviewed OTC symptom relief and return precautions.    No follow-ups on file.         Myles Gip, [place] [phone] (phone) [phone] (fax)  [place]

## 2021-09-20 IMAGING — MG MM DIGITAL SCREENING BILAT W/ TOMO AND CAD
8 series · 8 of 24 positions shown · non-contrast
Comparison: Previous exam(s).

CLINICAL DATA: Screening.

EXAM:
DIGITAL SCREENING BILATERAL MAMMOGRAM WITH TOMOSYNTHESIS AND CAD
TECHNIQUE: Bilateral screening digital craniocaudal and mediolateral oblique
mammograms were obtained. Bilateral screening digital breast
tomosynthesis was performed. The images were evaluated with
computer-aided detection.

[R MLO synth-2D]
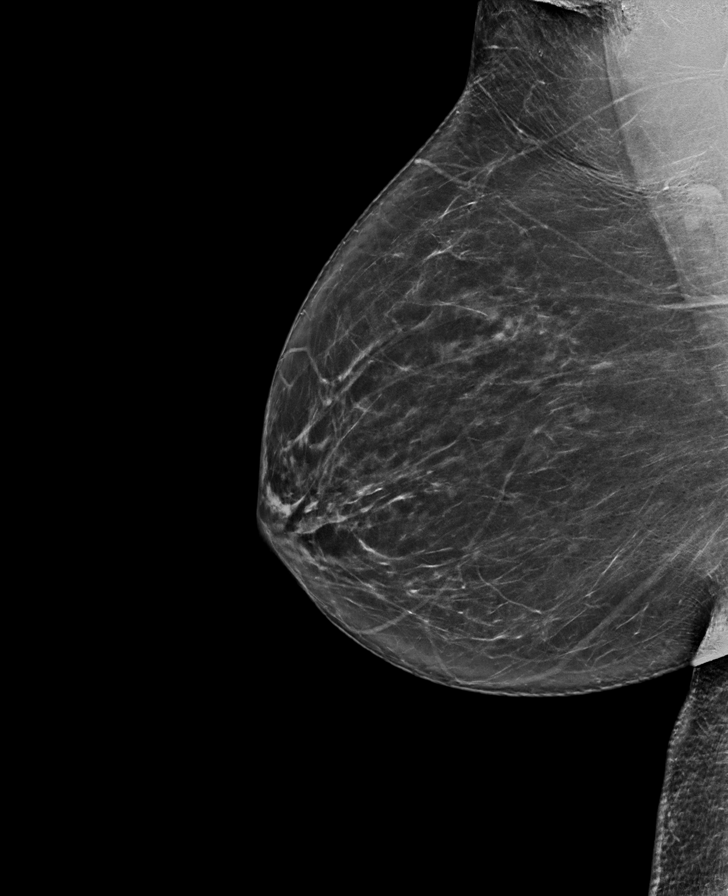

[R CC synth-2D]
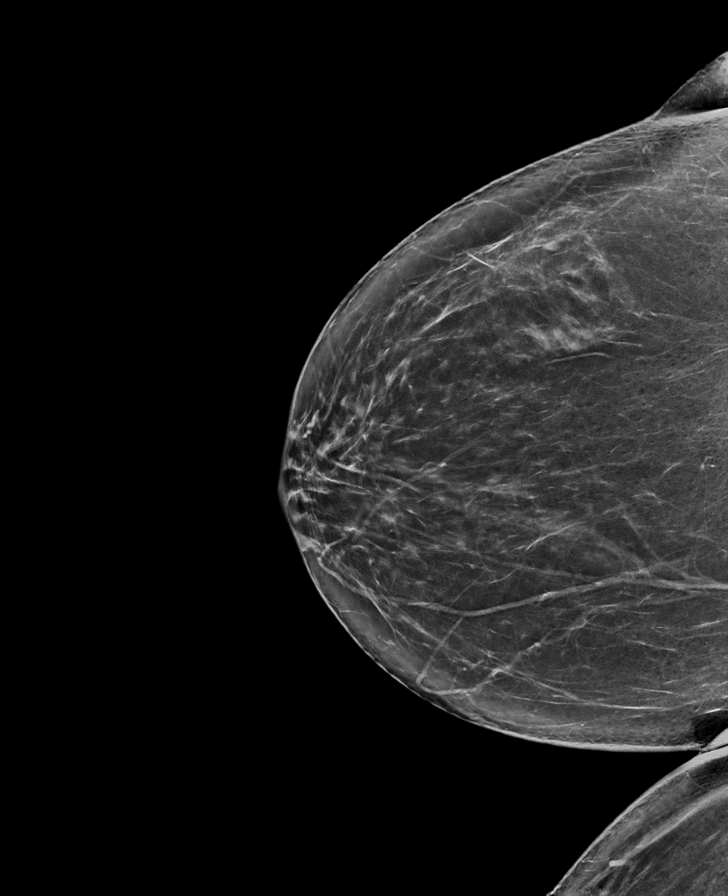

[L MLO synth-2D]
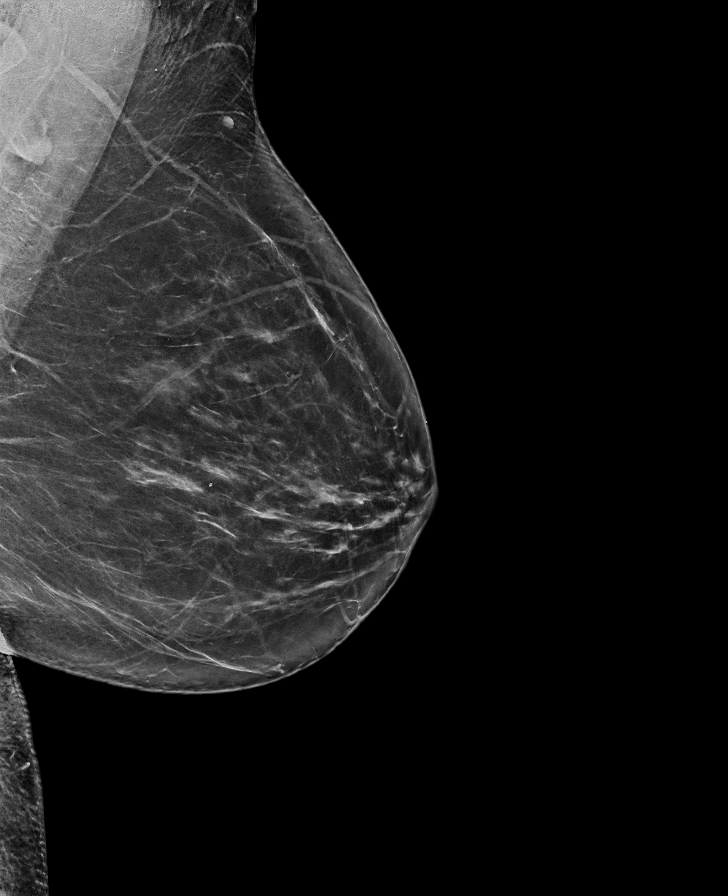

[L CC synth-2D]
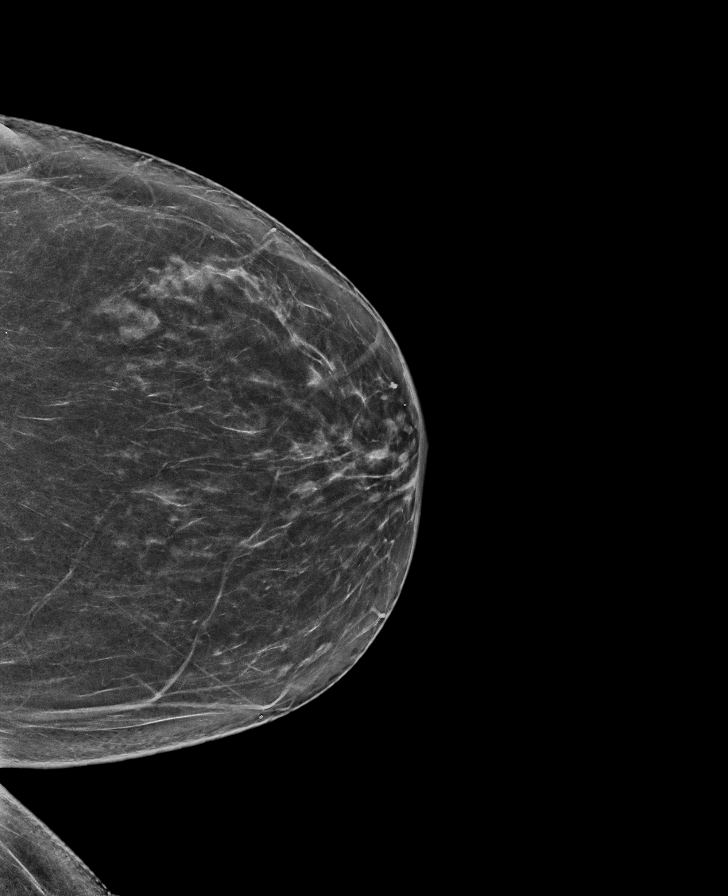

[R CC tomo · tomo slice 37/74.0]
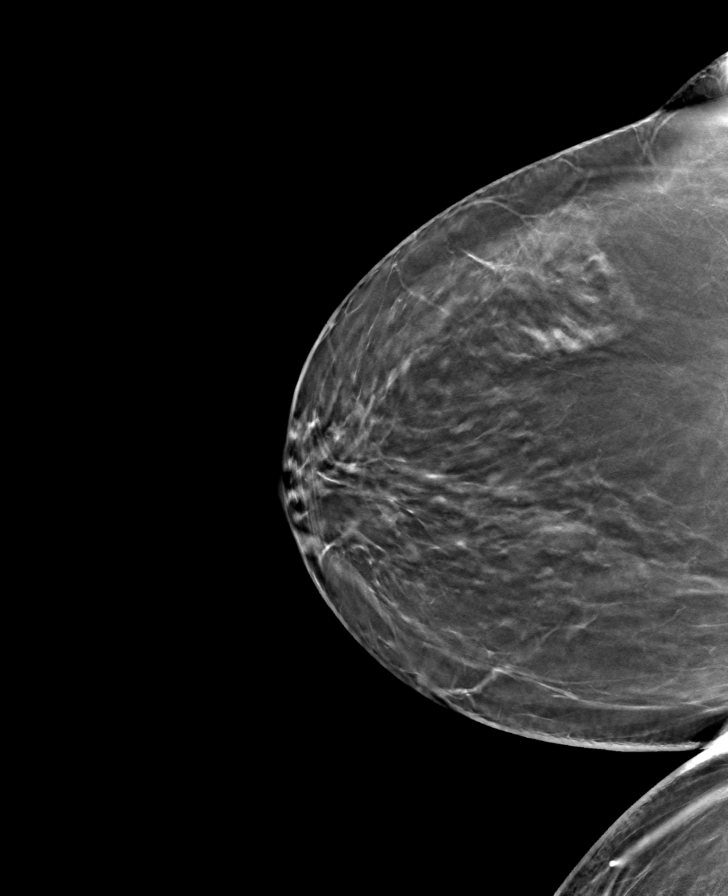

[R MLO tomo · tomo slice 39/78.0]
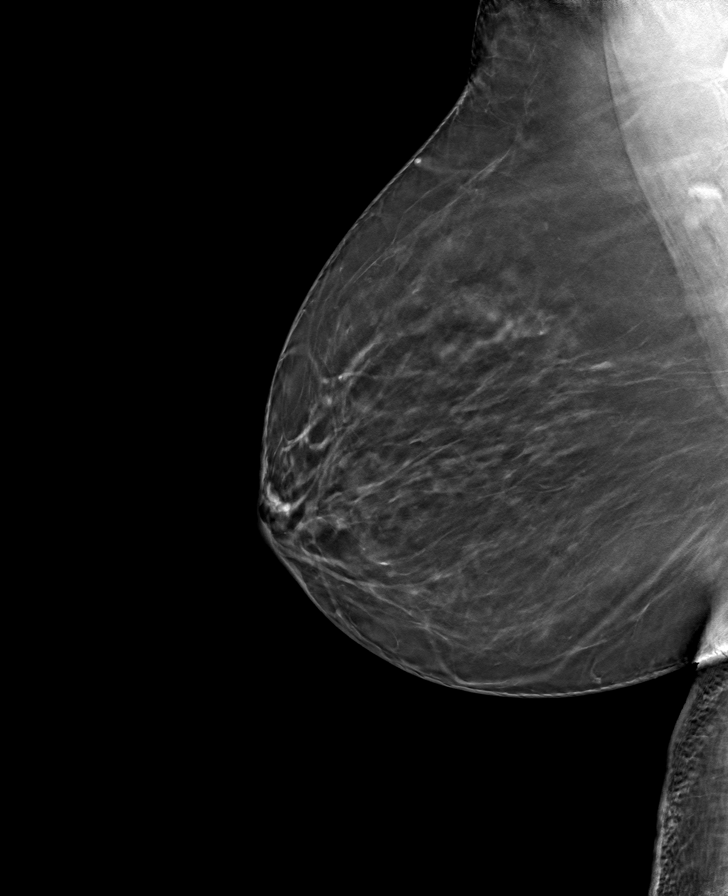

[L CC tomo · tomo slice 37/74.0]
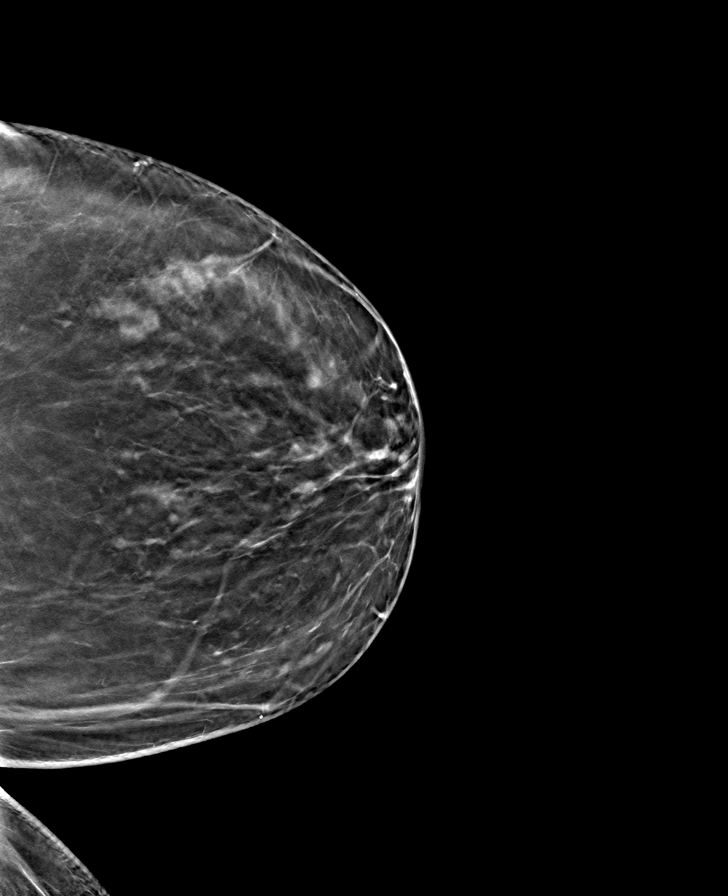

[L MLO tomo · tomo slice 42/83.0]
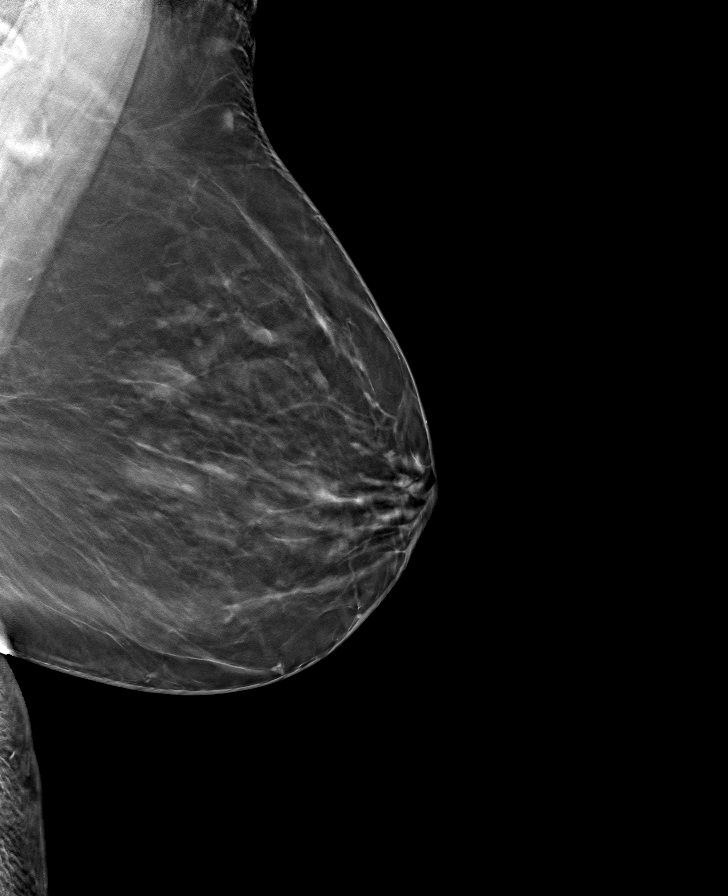

[8 of 24 positions shown; findings below may reference images not displayed]

ACR Breast Density Category b: There are scattered areas of
fibroglandular density.
FINDINGS: There are no findings suspicious for malignancy. The images were
evaluated with computer-aided detection.
IMPRESSION: No mammographic evidence of malignancy. A result letter of this
screening mammogram will be mailed directly to the patient.

RECOMMENDATION:
Screening mammogram in one year. (Code:WJ-I-BG6)

BI-RADS CATEGORY  1: Negative.

## 2021-09-22 ENCOUNTER — Ambulatory Visit: Payer: Self-pay | Admitting: *Deleted

## 2021-09-22 ENCOUNTER — Ambulatory Visit: Payer: Self-pay

## 2021-09-22 ENCOUNTER — Telehealth: Payer: Self-pay

## 2021-09-22 MED ORDER — DOXYCYCLINE HYCLATE 100 MG PO TABS: 100.0000 mg | ORAL_TABLET | Freq: Two times a day (BID) | ORAL | 0 refills | Status: AC

## 2021-09-22 NOTE — Telephone Encounter (Signed)
Copied from [place] [id number]. Topic: General - Other >> Sep 22, 2021  3:26 PM Alanda Slim E wrote: Reason for CRM: pt missed call from the office she asked if when calling back please call [phone]/please advise

## 2021-09-22 NOTE — Telephone Encounter (Signed)
Opened chart to answer a question for the agent.   Pt was returning a call from the practice.  She was triaged earlier today and was waiting for advice from the practice.

## 2021-09-22 NOTE — Addendum Note (Signed)
Addended by: Myles Gip on: 09/22/2021 04:28 PM   Modules accepted: Orders

## 2021-09-22 NOTE — Telephone Encounter (Signed)
Patient advised as below.  

## 2021-09-22 NOTE — Telephone Encounter (Signed)
give add. med   Pt  states last week saw DR Ky Barban for ears hurting and she gave pt prednisone only  and now not much better, ears hurting again and throat is killing her. Wants something else called in as Dr Ky Barban said if not better  to pls back/she would call in something else, FU [phone] Pt is allergic to Penicillin      Chief Complaint: Sore throat, ear pain Symptoms: Runny nose Frequency: Started last week Pertinent Negatives: Patient denies fever Disposition: [] ED /[] Urgent Care (no appt availability in office) / [] Appointment(In office/virtual)/ []  [facility]/ [] Home Care/ [] Refused Recommended Disposition /[] [facility]/ []  Follow-up with PCP Additional Notes: Finished Prednisone today. Still feeling bad. Would like medication sent to her pharmacy. Please advise pt.  Answer Assessment - Initial Assessment Questions 1. ONSET: "When did the throat start hurting?" (Hours or days ago)      Last week  2. SEVERITY: "How bad is the sore throat?" (Scale 1-10; mild, moderate or severe)   - MILD (1-3):  doesn't interfere with eating or normal activities   - MODERATE (4-7): interferes with eating some solids and normal activities   - SEVERE (8-10):  excruciating pain, interferes with most normal activities   - SEVERE DYSPHAGIA: can't swallow liquids, drooling     Moderate 3. STREP EXPOSURE: "Has there been any exposure to strep within the past week?" If Yes, ask: "What type of contact occurred?"      No 4.  VIRAL SYMPTOMS: "Are there any symptoms of a cold, such as a runny nose, cough, hoarse voice or red eyes?"      Runny nose, ear pain 5. FEVER: "Do you have a fever?" If Yes, ask: "What is your temperature, how was it measured, and when did it start?"     No 6. PUS ON THE TONSILS: "Is there pus on the tonsils in the back of your throat?"     No 7. OTHER SYMPTOMS: "Do you have any other symptoms?" (e.g., difficulty breathing, headache, rash)     Ear pain 8.  PREGNANCY: "Is there any chance you are pregnant?" "When was your last menstrual period?"     No  Protocols used: Sore Throat-A-AH

## 2021-09-26 ENCOUNTER — Other Ambulatory Visit: Payer: Self-pay | Admitting: Family Medicine

## 2021-09-27 ENCOUNTER — Other Ambulatory Visit: Payer: Self-pay | Admitting: Family Medicine

## 2021-09-29 ENCOUNTER — Other Ambulatory Visit: Payer: Self-pay | Admitting: Family Medicine

## 2021-09-29 DIAGNOSIS — R490 Dysphonia: Secondary | ICD-10-CM

## 2021-09-30 ENCOUNTER — Other Ambulatory Visit: Payer: Self-pay | Admitting: Family Medicine

## 2021-09-30 DIAGNOSIS — R490 Dysphonia: Secondary | ICD-10-CM

## 2021-09-30 MED ORDER — OMEPRAZOLE 40 MG PO CPDR: 40.0000 mg | DELAYED_RELEASE_CAPSULE | Freq: Every day | ORAL | 4 refills | Status: DC

## 2021-10-23 ENCOUNTER — Other Ambulatory Visit: Payer: Self-pay | Admitting: Family Medicine

## 2021-10-25 ENCOUNTER — Encounter: Payer: Self-pay | Admitting: Family Medicine

## 2021-11-08 ENCOUNTER — Other Ambulatory Visit: Payer: Self-pay | Admitting: Family Medicine

## 2021-11-19 DIAGNOSIS — H401131 Primary open-angle glaucoma, bilateral, mild stage: Secondary | ICD-10-CM | POA: Diagnosis not present

## 2021-11-27 DIAGNOSIS — H401131 Primary open-angle glaucoma, bilateral, mild stage: Secondary | ICD-10-CM | POA: Diagnosis not present

## 2021-12-05 NOTE — Progress Notes (Signed)
?  ? ?I,Roshena L Chambers,acting as a scribe for Lelon Huh, MD.,have documented all relevant documentation on the behalf of Lelon Huh, MD,as directed by  Lelon Huh, MD while in the presence of Lelon Huh, MD.  ? ?Established patient visit ? ? ?Patient: Deanna Weaver   DOB: 09-28-1945   75 y.o. Female  MRN: [id number] ?Visit Date: 12/08/2021 ? ?Today's healthcare provider: Lelon Huh, MD  ? ?Chief Complaint  ?Patient presents with  ? Hypertension  ? ?Subjective  ?  ?HPI  ?Hypertension, follow-up ? ?BP Readings from Last 3 Encounters:  ?12/08/21 140/82  ?09/16/21 (!) 150/72  ?07/07/21 (!) 142/68  ? Wt Readings from Last 3 Encounters:  ?12/08/21 150 lb (68 kg)  ?09/16/21 151 lb 1.6 oz (68.5 kg)  ?10/17/20 150 lb (68 kg)  ?  ? ?She was last seen for hypertension more than 1 year ago.   ?Management since that visit includes continuing same medications. ? ?She reports good compliance with treatment. ?She is not having side effects.  ?She is following a Regular diet. ?She is exercising. ?She does not smoke. ? ?Use of agents associated with hypertension: none.  ? ?Outside blood pressures are not checked. ?Symptoms: ?No chest pain No chest pressure  ?No palpitations No syncope  ?No dyspnea No orthopnea  ?No paroxysmal nocturnal dyspnea No lower extremity edema  ? ? ? ?---------------------------------------------------------------------------------------------------  ? ?Lipid/Cholesterol, Follow-up ? ?Last lipid panel Other pertinent labs  ?Lab Results  ?Component Value Date  ? CHOL 148 06/18/2020  ? HDL 33 (L) 06/18/2020  ? Mead 84 06/18/2020  ? TRIG 180 (H) 06/18/2020  ? CHOLHDL 4.5 (H) 06/18/2020  ? Lab Results  ?Component Value Date  ? ALT 13 06/18/2020  ? AST 19 06/18/2020  ? PLT 194 06/18/2020  ?  ? ?She was last seen for this more than 1 year ago.   ?Management since that visit includes continuing same medication. ? ?She reports good compliance with treatment. ?She is not having side effects.   ? ?Symptoms: ?No chest pain No chest pressure/discomfort  ?No dyspnea No lower extremity edema  ?No numbness or tingling of extremity No orthopnea  ?No palpitations No paroxysmal nocturnal dyspnea  ?No speech difficulty No syncope  ? ?Current diet: well balanced ?Current exercise: walking ? ?The 10-year ASCVD risk score (Arnett DK, et al., 2019) is: 23.9% ? ?---------------------------------------------------------------------------------------------------  ?Complains of pains in base of neck off and on for a few months when she turns her head. No numbness or tingling.  ? ? ?Medications: ?Outpatient Medications Prior to Visit  ?Medication Sig  ? ciprofloxacin-dexamethasone (CIPRODEX) OTIC suspension Place 4 drops into the right ear 2 (two) times daily. X 5-7 days  ? clotrimazole-betamethasone (LOTRISONE) cream APPLY 1 APPLICATION TOPICALLY 2 (TWO) TIMES DAILY.  ? estradiol (ESTRACE VAGINAL) 0.1 MG/GM vaginal cream Place 1 Applicatorful vaginally 2 (two) times a week.  ? fluticasone (FLONASE) 50 MCG/ACT nasal spray PLACE 2 SPRAYS INTO BOTH NOSTRILS DAILY. 2(TWO) NASAL EACH NOSTRIL ONCE A DAY  ? latanoprost (XALATAN) 0.005 % ophthalmic solution Place 1 drop into both eyes at bedtime.  ? lisinopril (ZESTRIL) 5 MG tablet TAKE 1 TABLET BY MOUTH EVERY DAY  ? methylPREDNISolone (MEDROL DOSEPAK) 4 MG TBPK tablet Day 1: Take 8 mg (2 tablets) before breakfast, 4 mg (1 tablet) after lunch, 4 mg (1 tablet) after supper, and 8 mg (2 tablets) at bedtime. Day 2:Take 4 mg (1 tablet) before breakfast, 4 mg (1 tablet) after lunch, 4 mg (1  tablet) after supper, and 8 mg (2 tablets) at bedtime. Day 3: Take 4 mg (1 tablet) before breakfast, 4 mg (1 tablet) after lunch, 4 mg (1 tablet) after supper, and 4 mg (1 tablet) at bedtime. Day 4: Take 4 mg (1 tablet) before breakfast, 4 mg (1 tablet) after lunch, and 4 mg (1 tablet) at bedtime. Day 5: Take 4 mg (1 tablet) before breakfast and 4 mg (1 tablet) at bedtime. Day 6: Take 4 mg (1  tablet) before breakfast.  ? montelukast (SINGULAIR) 10 MG tablet Take 1 tablet (10 mg total) by mouth daily. For allergies  ? Multiple Vitamins-Minerals (PRESERVISION AREDS 2 PO) Take by mouth 2 (two) times daily.   ? Omega-3 Fatty Acids (FISH OIL) 1000 MG CAPS Take 4 capsules by mouth daily.  ? omeprazole (PRILOSEC) 40 MG capsule Take 1 capsule (40 mg total) by mouth daily.  ? simvastatin (ZOCOR) 40 MG tablet TAKE 1 TABLET BY MOUTH EVERYDAY AT BEDTIME  ? ?No facility-administered medications prior to visit.  ? ? ? ? ? ?  Objective  ?  ?BP 140/82 (BP Location: Left Arm, Patient Position: Sitting, Cuff Size: Normal)   Pulse 60   Temp 98.1 ?F (36.7 ?C) (Oral)   Resp 14   Wt 150 lb (68 kg)   SpO2 99% Comment: room air  BMI 28.34 kg/m?  ? ?Today's Vitals  ? 12/08/21 1610 12/08/21 0810 12/08/21 9604  ?BP: (!) 157/33 140/82 138/86  ?Pulse: 60    ?Resp: 14    ?Temp: 98.1 ?F (36.7 ?C)    ?TempSrc: Oral    ?SpO2: 99%    ?Weight: 150 lb (68 kg)    ? ?Body mass index is 28.34 kg/m?.  ? ? ?Physical Exam  ? ? ?General: Appearance:     ?Well developed, well nourished female in no acute distress  ?Eyes:    PERRL, conjunctiva/corneas clear, EOM's intact       ?Lungs:     Clear to auscultation bilaterally, respirations unlabored  ?Heart:    Normal heart rate. Normal rhythm. No murmurs, rubs, or gallops.    ?MS:   All extremities are intact.    ?Neurologic:   Awake, alert, oriented x 3. No apparent focal neurological defect.   ?   ?  ? ? ? Assessment & Plan  ?  ?1. Primary hypertension ? ?Borderline on low dose of lisinopril. Consider increasing to '10mg'$  after reviewing labs. Encourage regular exercise and low salt diet.  ? ?- Comprehensive metabolic panel ?- Lipid panel ?- CBC ? ?2. Neck pain ?Consider imaging studies.   ?   ? ?The entirety of the information documented in the History of Present Illness, Review of Systems and Physical Exam were personally obtained by me. Portions of this information were initially documented  by the CMA and reviewed by me for thoroughness and accuracy.   ? ? ?Lelon Huh, MD  ?[facility] ?[phone] (phone) ?[phone] (fax) ? ?[facility]  ?

## 2021-12-08 ENCOUNTER — Encounter: Payer: Self-pay | Admitting: Family Medicine

## 2021-12-08 ENCOUNTER — Ambulatory Visit (INDEPENDENT_AMBULATORY_CARE_PROVIDER_SITE_OTHER): Payer: Medicare Other | Admitting: Family Medicine

## 2021-12-08 VITALS — BP 138/86 | HR 60 | Temp 98.1°F | Resp 14 | Wt 150.0 lb

## 2021-12-08 DIAGNOSIS — I10 Essential (primary) hypertension: Secondary | ICD-10-CM

## 2021-12-08 DIAGNOSIS — M542 Cervicalgia: Secondary | ICD-10-CM

## 2021-12-09 LAB — LIPID PANEL
Chol/HDL Ratio: 4.4 ratio (ref 0.0–4.4)
Cholesterol, Total: 155 mg/dL (ref 100–199)
HDL: 35 mg/dL — ABNORMAL LOW (ref >39)
LDL Chol Calc (NIH): 87 mg/dL (ref 0–99)
Triglycerides: 192 mg/dL — ABNORMAL HIGH (ref 0–149)
VLDL Cholesterol Cal: 33 mg/dL (ref 5–40)

## 2021-12-09 LAB — COMPREHENSIVE METABOLIC PANEL
ALT: 19 IU/L (ref 0–32)
AST: 21 IU/L (ref 0–40)
Albumin/Globulin Ratio: 1.9 (ref 1.2–2.2)
Albumin: 4.8 g/dL — ABNORMAL HIGH (ref 3.7–4.7)
Alkaline Phosphatase: 61 IU/L (ref 44–121)
BUN/Creatinine Ratio: 15 (ref 12–28)
BUN: 11 mg/dL (ref 8–27)
Bilirubin Total: 0.4 mg/dL (ref 0.0–1.2)
CO2: 25 mmol/L (ref 20–29)
Calcium: 10.1 mg/dL (ref 8.7–10.3)
Chloride: 100 mmol/L (ref 96–106)
Creatinine, Ser: 0.72 mg/dL (ref 0.57–1.00)
Globulin, Total: 2.5 g/dL (ref 1.5–4.5)
Glucose: 114 mg/dL — ABNORMAL HIGH (ref 70–99)
Potassium: 5.1 mmol/L (ref 3.5–5.2)
Sodium: 143 mmol/L (ref 134–144)
Total Protein: 7.3 g/dL (ref 6.0–8.5)
eGFR: 87 mL/min/{1.73_m2} (ref >59)

## 2021-12-09 LAB — CBC
Hematocrit: 43.9 % (ref 34.0–46.6)
Hemoglobin: 15 g/dL (ref 11.1–15.9)
MCH: 32.3 pg (ref 26.6–33.0)
MCHC: 34.2 g/dL (ref 31.5–35.7)
MCV: 94 fL (ref 79–97)
Platelets: 176 10*3/uL (ref 150–450)
RBC: 4.65 x10E6/uL (ref 3.77–5.28)
RDW: 11.8 % (ref 11.7–15.4)
WBC: 7.7 10*3/uL (ref 3.4–10.8)

## 2021-12-11 ENCOUNTER — Other Ambulatory Visit: Payer: Self-pay | Admitting: Family Medicine

## 2021-12-11 DIAGNOSIS — J309 Allergic rhinitis, unspecified: Secondary | ICD-10-CM

## 2021-12-15 ENCOUNTER — Telehealth: Payer: Self-pay

## 2021-12-15 DIAGNOSIS — M542 Cervicalgia: Secondary | ICD-10-CM

## 2021-12-15 NOTE — Telephone Encounter (Signed)
Copied from [place] [id number]. Topic: General - Other ?>> Dec 15, 2021  1:25 PM McGill, Nelva Bush wrote: ?Reason for CRM: Pt is requesting a call back to discuss recent labs. ?

## 2021-12-16 NOTE — Telephone Encounter (Signed)
Dr. Caryn Section sent patient a mychart message regarding her lab results. Tried returning patient's call. Left message to call back. OK for [facility] triage to advise of lab results.  ?

## 2021-12-17 NOTE — Telephone Encounter (Signed)
Order placed, can go to [facility]. Does not need appointment. ?

## 2021-12-17 NOTE — Telephone Encounter (Signed)
Pt given lab results per notes of Dr. Caryn Section on 12/09/21. Pt verbalized understanding. Appointment scheduled for 06/19/22. She mentioned about her neck pain off and on and says Dr. Caryn Section didn't say anything to her about it. I advised last OV note he noted the neck pain off and on and noted consider imaging studies. She says she feels it when she's riding and if a bump is hit and when she gets up in the morning, otherwise she's not having pain at the moment. She says she would be interested in having imaging studies if that's what he recommends. I advised I will send this to Dr. Caryn Section and someone will call back to let her know about the imaging. ? ? ? ? ?Good morning,  ?  ?Your cholesterol is good at 155, and your kidney functions and electrolytes are normal. Please continue your current medications and see if you get your blood pressure down a little by avoiding salty foods and exercising 30 minutes every day. Please schedule  a follow up in 5-6 months to check on your blood pressure and medications.  ?  ?Have a great day,  ?  ?Dr. Caryn Section  ?Written by Birdie Sons, MD on 12/09/2021  6:43 AM EDT ?

## 2021-12-17 NOTE — Telephone Encounter (Signed)
Pt informed and agreeable to go for imaging.  ?

## 2021-12-19 ENCOUNTER — Ambulatory Visit
Admission: RE | Admit: 2021-12-19 | Discharge: 2021-12-19 | Disposition: A | Discharge status: Home / Self Care | Payer: Medicare Other | Attending: Family Medicine | Admitting: Family Medicine

## 2021-12-19 ENCOUNTER — Ambulatory Visit
Admission: RE | Admit: 2021-12-19 | Discharge: 2021-12-19 | Disposition: A | Discharge status: Home / Self Care | Payer: Medicare Other | Source: Ambulatory Visit | Attending: Family Medicine | Admitting: Family Medicine

## 2021-12-19 DIAGNOSIS — M542 Cervicalgia: Secondary | ICD-10-CM | POA: Diagnosis not present

## 2021-12-19 DIAGNOSIS — M50321 Other cervical disc degeneration at C4-C5 level: Secondary | ICD-10-CM | POA: Diagnosis not present

## 2021-12-24 ENCOUNTER — Telehealth: Payer: Self-pay

## 2021-12-24 NOTE — Telephone Encounter (Signed)
Copied from [place]. Topic: General - Other >> Dec 24, 2021 12:39 PM Valere Dross wrote: Reason for CRM: Pt called in about her imaging results and requested a call back, please advise.

## 2021-12-24 NOTE — Telephone Encounter (Signed)
Xray results in chart awaiting provider review.

## 2021-12-25 ENCOUNTER — Other Ambulatory Visit: Payer: Self-pay

## 2021-12-25 DIAGNOSIS — M542 Cervicalgia: Secondary | ICD-10-CM

## 2021-12-25 MED ORDER — MELOXICAM 7.5 MG PO TABS: 7.5000 mg | ORAL_TABLET | Freq: Every day | ORAL | 4 refills | Status: DC

## 2022-01-01 ENCOUNTER — Telehealth: Payer: Self-pay | Admitting: Family Medicine

## 2022-01-01 NOTE — Telephone Encounter (Signed)
Pt called and reported that the pharmacist told her that she may be allergic to Meloxicam because she is allergic to Celebrex, which has similar ingredients. Call back request from the clinic   [phone]

## 2022-01-01 NOTE — Telephone Encounter (Signed)
Celebrex has a sulfa component, which is what is responsible for allergic reactions in most people. Meloxicam does not have any sulfa component.  Meloxicam is more closely related to ibuprofen. If she is allergic to ibuprofen then she wouldn't want to take meloxicam.

## 2022-01-02 NOTE — Telephone Encounter (Signed)
Patient advised.  States she is not allergic to Ibuprofen

## 2022-01-09 ENCOUNTER — Telehealth: Payer: Self-pay

## 2022-01-09 NOTE — Telephone Encounter (Signed)
Dr. Caryn Section, have you seen a fax come through for a prior authorization?

## 2022-01-09 NOTE — Telephone Encounter (Signed)
Copied from [place]. Topic: General - Other >> Jan 09, 2022  9:54 AM Cyndi Bender wrote: Reason for CRM: Corene Cornea with [employer] called to see if the fax that was sent twice this week regarding prior authorization was received. Cb# [phone]

## 2022-01-12 NOTE — Telephone Encounter (Signed)
Deanna Weaver calling back to follow up.   Please advise.   Please call back to follow up - [phone]  [email or web address]

## 2022-01-13 NOTE — Telephone Encounter (Signed)
Called patient per Dr. Caryn Section and she states they have "been worrying her to death."  They have called her 10 times and she is no longer answering the phone.  She told them if she needed the medical device she would get it through her doctor.  She is concerned about how they got her information. I told her I would contact them and let them know to not call her again but phones just rings.  No VM.  If they call again please relay message to not contact the patient.  Okay for [facility] triage to relay message.

## 2022-01-15 NOTE — Telephone Encounter (Signed)
Rep called back from [employer], informed rep of message below and asked rep to delete patient's information  Rep verbalized understanding

## 2022-01-15 NOTE — Telephone Encounter (Signed)
Pt called and informed.  States she has not gotten a call in the past several days.

## 2022-02-13 ENCOUNTER — Encounter: Payer: Self-pay | Admitting: Physician Assistant

## 2022-02-13 ENCOUNTER — Ambulatory Visit (INDEPENDENT_AMBULATORY_CARE_PROVIDER_SITE_OTHER): Payer: Medicare Other | Admitting: Physician Assistant

## 2022-02-13 VITALS — BP 125/80 | HR 76 | Temp 98.7°F | Ht 61.0 in | Wt 150.9 lb

## 2022-02-13 DIAGNOSIS — B070 Plantar wart: Secondary | ICD-10-CM | POA: Diagnosis not present

## 2022-02-13 NOTE — Progress Notes (Signed)
    I,Sha'taria Tyson,acting as a Education administrator for [employer], PA-C.,have documented all relevant documentation on the behalf of Mikey Kirschner, PA-C,as directed by  Mikey Kirschner, PA-C while in the presence of Mikey Kirschner, PA-C.   Acute Office Visit  Subjective:     Patient ID: Deanna Weaver, female    DOB: 1945/12/24, 76 y.o.   MRN: [id number]  Cc. Wart right foot x 2 weeks   HPI Patient is in today for wart on right foot. Warts Patient complains of warts. The wart has been present for about 2 weeks and is located on the right foot. Previous treatment has included OTC freezing treatment with poor improvement. Reports that it feels a little bit bigger, no discharge, and pain when walking on it a lot.      Objective:    Blood pressure 125/80, pulse 76, temperature 98.7 F (37.1 C), temperature source Oral, height '5\' 1"'$  (1.549 m), weight 150 lb 14.4 oz (68.4 kg), SpO2 96 %.   Physical Exam Vitals reviewed.  Constitutional:      Appearance: She is not ill-appearing.  HENT:     Head: Normocephalic.  Eyes:     Conjunctiva/sclera: Conjunctivae normal.  Cardiovascular:     Rate and Rhythm: Normal rate.  Pulmonary:     Effort: Pulmonary effort is normal. No respiratory distress.  Feet:     Right foot:     Skin integrity: Skin integrity normal.     Left foot:     Skin integrity: Skin integrity normal.     Comments: Thickened circular patch of skin R plantar surface  Neurological:     General: No focal deficit present.     Mental Status: She is alert and oriented to person, place, and time.  Psychiatric:        Mood and Affect: Mood normal.        Behavior: Behavior normal.    No results found for any visits on 02/13/22.     Assessment & Plan:   Plantar wart Advising otc salicyclic acid w/ occlusive dressing Per pt request refer to podiatry.   I, Mikey Kirschner, PA-C have reviewed all documentation for this visit. The documentation on  02/13/2022 for the exam,  diagnosis, procedures, and orders are all accurate and complete.  Mikey Kirschner, PA-C [facility] [address] [place], [place], [address] Office: [phone] Fax: [phone]

## 2022-03-04 ENCOUNTER — Other Ambulatory Visit: Payer: Self-pay | Admitting: Family Medicine

## 2022-03-04 ENCOUNTER — Encounter: Payer: Self-pay | Admitting: Podiatry

## 2022-03-04 ENCOUNTER — Ambulatory Visit (INDEPENDENT_AMBULATORY_CARE_PROVIDER_SITE_OTHER): Payer: Medicare Other | Admitting: Podiatry

## 2022-03-04 DIAGNOSIS — Z1231 Encounter for screening mammogram for malignant neoplasm of breast: Secondary | ICD-10-CM

## 2022-03-04 DIAGNOSIS — B070 Plantar wart: Secondary | ICD-10-CM

## 2022-03-04 DIAGNOSIS — B351 Tinea unguium: Secondary | ICD-10-CM

## 2022-03-04 MED ORDER — CICLOPIROX 8 % EX SOLN: Freq: Every day | CUTANEOUS | 5 refills | Status: DC

## 2022-03-04 NOTE — Progress Notes (Signed)
  Subjective:  Patient ID: Deanna Weaver, female    DOB: 1946-04-15,  MRN: [id number]  Chief Complaint  Patient presents with   Plantar Warts     np right foot plantar wart    76 y.o. female presents with the above complaint. History confirmed with patient.  She has been using Compound W on it.  She also notes discoloration of her toenails on the left foot  Objective:  Physical Exam: warm, good capillary refill, no trophic changes or ulcerative lesions, normal DP and PT pulses, normal sensory exam, and plantar verruca mid forefoot right..  Yellow dystrophic toenails on the left foot  Assessment:   1. Verruca plantaris   2. Onychomycosis      Plan:  Patient was evaluated and treated and all questions answered.  Discussed oral and topical treatment options for onychomycosis.  She is interested in topical treatment.  I recommended Penlac and sent this to her pharmacy.  Discussed etiology and treatment of verruca plantaris in detail with the patient as well as multiple treatment options including blistering agents, chemotherapeutic agents, surgical excision, laser therapy and the indications and roles of the above.  Today, recommended treatment with salinocaine as noted in procedure note below.  Expect this will resolve uneventfully at this point  Procedure: Destruction of Lesion Location: Right forefoot Instrumentation: 15 blade. Technique: Debridement of lesion to petechial bleeding. Aperture pad applied around lesion. Small amount of salinocaine applied to the base of the lesion. Dressing: Dry, sterile, compression dressing. Disposition: Patient tolerated procedure well. Advised to leave dressing on for 6-8 hours. Thereafter patient to wash the area with soap and water and applied band-aid. Off-loading pads dispensed.   Return if symptoms worsen or fail to improve.

## 2022-03-04 NOTE — Patient Instructions (Signed)
Take dressing off in 8 hours and wash the foot with soap and water. If it is hurting or becomes uncomfortable before the 8 hours, go ahead and remove the bandage and wash the area.  If it blisters, apply antibiotic ointment and a band-aid.  Monitor for any signs/symptoms of infection. Call the office immediately if any occur or go directly to the emergency room. Call with any questions/concerns.   

## 2022-03-06 ENCOUNTER — Other Ambulatory Visit: Payer: Self-pay | Admitting: Family Medicine

## 2022-03-10 DIAGNOSIS — M3501 Sicca syndrome with keratoconjunctivitis: Secondary | ICD-10-CM | POA: Diagnosis not present

## 2022-03-18 ENCOUNTER — Encounter: Payer: Self-pay | Admitting: Family Medicine

## 2022-03-18 ENCOUNTER — Ambulatory Visit (INDEPENDENT_AMBULATORY_CARE_PROVIDER_SITE_OTHER): Payer: Medicare Other | Admitting: Family Medicine

## 2022-03-18 VITALS — BP 100/69 | HR 80 | Temp 98.3°F | Resp 16 | Wt 147.0 lb

## 2022-03-18 DIAGNOSIS — J069 Acute upper respiratory infection, unspecified: Secondary | ICD-10-CM | POA: Diagnosis not present

## 2022-03-18 MED ORDER — PREDNISONE 10 MG (21) PO TBPK: ORAL_TABLET | ORAL | 0 refills | Status: DC

## 2022-03-18 NOTE — Progress Notes (Signed)
    SUBJECTIVE:   CHIEF COMPLAINT / HPI:   UPPER RESPIRATORY TRACT INFECTION - symptom onset Friday  Fever: no Cough: yes, productive of green phlegm Shortness of breath: no Chest pain: no Chest tightness:  with coughing Chest congestion: yes Nasal congestion: yes Runny nose:  not anymore Sneezing:  resolved Sore throat:  resolved Sinus pressure: yes Ear pain: no  Ear pressure: no  Vomiting: no Fatigue: yes Sick contacts: yes Relief with OTC cold/cough medications:  helped a little , not much  Treatments attempted: tylenol, cepacol throat lozenges, mucinex-D    OBJECTIVE:   BP 100/69 (BP Location: Left Arm, Patient Position: Sitting, Cuff Size: Large)   Pulse 80   Temp 98.3 F (36.8 C) (Oral)   Resp 16   Wt 147 lb (66.7 kg)   SpO2 95%   BMI 27.78 kg/m   Gen: tired appearing, in NAD Card: RRR Lungs: CTAB. No wheeze, rales, rhonchi. Ext: WWP, no edema   ASSESSMENT/PLAN:   VIRAL URI Doing well with mild-mod sx. Not a candidate for COVID treatment due to duration of symptoms, will defer testing. Given minimal relief with OTC decongestant, will provide steroid taper. Reviewed return and emergency precautions.      Myles Gip, DO

## 2022-03-20 ENCOUNTER — Ambulatory Visit: Payer: Self-pay

## 2022-03-20 DIAGNOSIS — J069 Acute upper respiratory infection, unspecified: Secondary | ICD-10-CM

## 2022-03-20 NOTE — Telephone Encounter (Signed)
  Chief Complaint: URI fu  Symptoms: sore throat, hoarseness, coughing up green mucus  Frequency: 1 week + Pertinent Negatives: Patient denies fever Disposition: '[]'$ ED /'[]'$ Urgent Care (no appt availability in office) / '[]'$ Appointment(In office/virtual)/ '[]'$  [facility]/ '[]'$ Home Care/ '[]'$ Refused Recommended Disposition /'[]'$ [facility]/ '[x]'$  Follow-up with PCP Additional Notes: pt is asking if abx can be sent in since her sx havent gotten any better since prednisone was started on 03/18/22.  Pt states Dr. Ky Barban said if she didn't get any better to call back. Advised pt I will send message but d/t close to office closing couldn't promise anyone would fu with her this evening and she could go to UC.   Summary: medication management   Pt states steroid rx was prescribed on 8-16   Pt states medication is not working, she is coughing up green phlegm and has hoarseness   Pt thinks she needs an antibiotic   Please assist further      Reason for Disposition  [1] Sinus congestion (pressure, fullness) AND [2] present > 10 days  Answer Assessment - Initial Assessment Questions 2. ONSET: "When did the sinus pain start?"  (e.g., hours, days)      Since last Friday  3. SEVERITY: "How bad is the pain?"   (Scale 1-10; mild, moderate or severe)   - MILD (1-3): doesn't interfere with normal activities    - MODERATE (4-7): interferes with normal activities (e.g., work or school) or awakens from sleep   - SEVERE (8-10): excruciating pain and patient unable to do any normal activities        moderate 5. NASAL CONGESTION: "Is the nose blocked?" If Yes, ask: "Can you open it or must you breathe through your mouth?"     yes 6. NASAL DISCHARGE: "Do you have discharge from your nose?" If so ask, "What color?"     yes 7. FEVER: "Do you have a fever?" If Yes, ask: "What is it, how was it measured, and when did it start?"      no 8. OTHER SYMPTOMS: "Do you have any other symptoms?" (e.g., sore  throat, cough, earache, difficulty breathing)     Sore throat, hoarseness, cough with productive green mucus  Protocols used: Sinus Pain or Congestion-A-AH

## 2022-03-21 DIAGNOSIS — Z20822 Contact with and (suspected) exposure to covid-19: Secondary | ICD-10-CM | POA: Diagnosis not present

## 2022-03-21 DIAGNOSIS — J101 Influenza due to other identified influenza virus with other respiratory manifestations: Secondary | ICD-10-CM | POA: Diagnosis not present

## 2022-03-21 DIAGNOSIS — J019 Acute sinusitis, unspecified: Secondary | ICD-10-CM | POA: Diagnosis not present

## 2022-03-21 DIAGNOSIS — R5383 Other fatigue: Secondary | ICD-10-CM | POA: Diagnosis not present

## 2022-03-23 MED ORDER — AZITHROMYCIN 250 MG PO TABS: ORAL_TABLET | ORAL | 0 refills | Status: DC

## 2022-03-23 NOTE — Addendum Note (Signed)
Addended by: Birdie Sons on: 03/23/2022 08:52 AM   Modules accepted: Orders

## 2022-03-23 NOTE — Telephone Encounter (Signed)
Patient advised of message below. Patient informed me that she went to a walk in urgent care clinic this weekend and was prescribed Doxycycline. Patient already started taking Doxycycline. She states she will call us back if symptoms do not improve.

## 2022-03-23 NOTE — Telephone Encounter (Signed)
Have sent prescription for azithromycin to [facility]

## 2022-03-24 ENCOUNTER — Ambulatory Visit
Admission: RE | Admit: 2022-03-24 | Discharge: 2022-03-24 | Disposition: A | Discharge status: Home / Self Care | Payer: Medicare Other | Source: Ambulatory Visit | Attending: Family Medicine | Admitting: Family Medicine

## 2022-03-24 ENCOUNTER — Encounter: Payer: Self-pay | Admitting: Family Medicine

## 2022-03-24 ENCOUNTER — Ambulatory Visit
Admission: RE | Admit: 2022-03-24 | Discharge: 2022-03-24 | Disposition: A | Discharge status: Home / Self Care | Payer: Medicare Other | Attending: Family Medicine | Admitting: Family Medicine

## 2022-03-24 ENCOUNTER — Ambulatory Visit (INDEPENDENT_AMBULATORY_CARE_PROVIDER_SITE_OTHER): Payer: Medicare Other | Admitting: Family Medicine

## 2022-03-24 ENCOUNTER — Telehealth: Payer: Self-pay | Admitting: Family Medicine

## 2022-03-24 VITALS — BP 134/72 | HR 68 | Temp 98.7°F | Resp 16 | Wt 149.7 lb

## 2022-03-24 DIAGNOSIS — R051 Acute cough: Secondary | ICD-10-CM | POA: Insufficient documentation

## 2022-03-24 DIAGNOSIS — R0602 Shortness of breath: Secondary | ICD-10-CM

## 2022-03-24 DIAGNOSIS — R059 Cough, unspecified: Secondary | ICD-10-CM | POA: Diagnosis not present

## 2022-03-24 MED ORDER — HYDROCODONE BIT-HOMATROP MBR 5-1.5 MG/5ML PO SOLN: 5.0000 mL | Freq: Three times a day (TID) | ORAL | 0 refills | Status: DC | PRN

## 2022-03-24 NOTE — Progress Notes (Signed)
I,Sulibeya S Dimas,acting as a scribe for Lelon Huh, MD.,have documented all relevant documentation on the behalf of Lelon Huh, MD,as directed by  Lelon Huh, MD while in the presence of Lelon Huh, MD.   Established patient visit   Patient: Deanna Weaver   DOB: [birth year]   76 y.o. Female  MRN: [id number] Visit Date: 03/24/2022  Today's healthcare provider: Lelon Huh, MD   Chief Complaint  Patient presents with   Cough   Subjective    HPI  Patient was seen at an urgent care on 03/21/2022. She tested positive for influenza A. Patient reports COVID and strep test were negative at Urgent Care.  Patient reports taking doxycycline '100mg'$  and benzonatate daily. Patient has not picked up azithromycin that was sent in yesterday. Patient reports completing a round of prednisone that was prescribed on the 16th. Patient reports cough has not improved. She reports shortness of breath with cough. Patient reports taking Mucinex.   Medications: Outpatient Medications Prior to Visit  Medication Sig   benzonatate (TESSALON) 200 MG capsule Take by mouth.   ciclopirox (PENLAC) 8 % solution Apply topically at bedtime. Apply over nail and surrounding skin. Apply daily over previous coat. After seven (7) days, may remove with alcohol and continue cycle.   ciprofloxacin-dexamethasone (CIPRODEX) OTIC suspension Place 4 drops into the right ear 2 (two) times daily. X 5-7 days   clotrimazole-betamethasone (LOTRISONE) cream APPLY 1 APPLICATION TOPICALLY 2 (TWO) TIMES DAILY.   doxycycline (VIBRAMYCIN) 100 MG capsule Take by mouth.   estradiol (ESTRACE VAGINAL) 0.1 MG/GM vaginal cream Place 1 Applicatorful vaginally 2 (two) times a week.   fluticasone (FLONASE) 50 MCG/ACT nasal spray PLACE 2 SPRAYS INTO BOTH NOSTRILS DAILY. 2(TWO) NASAL EACH NOSTRIL ONCE A DAY   latanoprost (XALATAN) 0.005 % ophthalmic solution Place 1 drop into both eyes at bedtime.   lisinopril (ZESTRIL) 5 MG tablet  TAKE 1 TABLET BY MOUTH EVERY DAY   meloxicam (MOBIC) 7.5 MG tablet Take 1 tablet (7.5 mg total) by mouth daily.   montelukast (SINGULAIR) 10 MG tablet TAKE 1 TABLET (10 MG TOTAL) BY MOUTH DAILY. FOR ALLERGIES   Multiple Vitamins-Minerals (PRESERVISION AREDS 2 PO) Take by mouth 2 (two) times daily.    Omega-3 Fatty Acids (FISH OIL) 1000 MG CAPS Take 4 capsules by mouth daily.   omeprazole (PRILOSEC) 40 MG capsule Take 1 capsule (40 mg total) by mouth daily.   simvastatin (ZOCOR) 40 MG tablet TAKE 1 TABLET BY MOUTH EVERYDAY AT BEDTIME   [DISCONTINUED] azithromycin (ZITHROMAX) 250 MG tablet Take 2 tablets on day 1, then 1 tablet daily on days 2 through 5   [DISCONTINUED] predniSONE (STERAPRED UNI-PAK 21 TAB) 10 MG (21) TBPK tablet Take per package directions. (Patient not taking: Reported on 03/24/2022)   No facility-administered medications prior to visit.    Review of Systems  Constitutional:  Negative for appetite change, chills and fever.  HENT:  Positive for congestion, postnasal drip, rhinorrhea, sinus pressure, sinus pain, sore throat, trouble swallowing and voice change. Negative for ear pain.   Respiratory:  Positive for cough, chest tightness, shortness of breath and wheezing.   Cardiovascular:  Negative for chest pain and palpitations.  Gastrointestinal:  Positive for constipation. Negative for abdominal pain, diarrhea, nausea and vomiting.       Objective    BP 134/72 (BP Location: Right Arm, Patient Position: Sitting, Cuff Size: Large)   Pulse 68   Temp 98.7 F (37.1 C) (Oral)  Resp 16   Wt 149 lb 11.2 oz (67.9 kg)   SpO2 96%   BMI 28.29 kg/m    Physical Exam    General: Appearance:    Well developed, well nourished female in no acute distress  Eyes:    PERRL, conjunctiva/corneas clear, EOM's intact       Lungs:     Diffuse expiratory wheezes, particularly at bases, respirations unlabored  Heart:    Normal heart rate. Normal rhythm. No murmurs, rubs, or gallops.     MS:   All extremities are intact.    Neurologic:   Awake, alert, oriented x 3. No apparent focal neurological defect.         Assessment & Plan     1. Acute cough Sx x 10 days, positive flu A at Urgent Care on 8/19. Completed 2 courses of prednisone. No improvement with doxycycline or azithromycin. May have secondary bronchitis or pneumonia - DG Chest 2 View; Future  2. Shortness of breath  - DG Chest 2 View; Future       The entirety of the information documented in the History of Present Illness, Review of Systems and Physical Exam were personally obtained by me. Portions of this information were initially documented by the CMA and reviewed by me for thoroughness and accuracy.     Lelon Huh, MD  [facility] [phone] (phone) [phone] (fax)  [place]

## 2022-03-26 ENCOUNTER — Other Ambulatory Visit: Payer: Self-pay

## 2022-03-26 ENCOUNTER — Telehealth: Payer: Self-pay

## 2022-03-26 MED ORDER — MOXIFLOXACIN HCL 400 MG PO TABS: 400.0000 mg | ORAL_TABLET | Freq: Every day | ORAL | 0 refills | Status: AC

## 2022-03-26 NOTE — Progress Notes (Signed)
[employer]

## 2022-03-26 NOTE — Telephone Encounter (Signed)
Please review and advise.

## 2022-03-26 NOTE — Telephone Encounter (Signed)
Copied from [place] [id number]. Topic: General - Other >> Mar 26, 2022 10:21 AM [place] C wrote: Reason for CRM: The patient would like to be contacted by a member of clinical staff to review their recent chest x ray results when possible  Please contact further to review

## 2022-03-30 ENCOUNTER — Ambulatory Visit
Admission: RE | Admit: 2022-03-30 | Discharge: 2022-03-30 | Disposition: A | Discharge status: Home / Self Care | Payer: Medicare Other | Source: Ambulatory Visit | Attending: Family Medicine | Admitting: Family Medicine

## 2022-03-30 DIAGNOSIS — Z1231 Encounter for screening mammogram for malignant neoplasm of breast: Secondary | ICD-10-CM | POA: Insufficient documentation

## 2022-04-29 ENCOUNTER — Ambulatory Visit (INDEPENDENT_AMBULATORY_CARE_PROVIDER_SITE_OTHER): Payer: Medicare Other

## 2022-04-29 VITALS — Ht 61.0 in | Wt 149.0 lb

## 2022-04-29 DIAGNOSIS — Z0000 Encounter for general adult medical examination without abnormal findings: Secondary | ICD-10-CM | POA: Diagnosis not present

## 2022-04-29 NOTE — Progress Notes (Signed)
Virtual Visit via Telephone Note  I connected with  Deanna Weaver on 04/29/22 at  2:15 PM EDT by telephone and verified that I am speaking with the correct person using two identifiers.  Location: Patient: home Provider: [facility] Persons participating in the virtual visit: Deanna Weaver   I discussed the limitations, risks, security and privacy concerns of performing an evaluation and management service by telephone and the availability of in person appointments. The patient expressed understanding and agreed to proceed.  Interactive audio and video telecommunications were attempted between this nurse and patient, however failed, due to patient having technical difficulties OR patient did not have access to video capability.  We continued and completed visit with audio only.  Some vital signs may be absent or patient reported.   Deanna David, LPN  Subjective:   Deanna Weaver is a 76 y.o. female who presents for Medicare Annual (Subsequent) preventive examination.  Review of Systems     Cardiac Risk Factors include: advanced age (>70mn, >>54women);hypertension;dyslipidemia     Objective:    There were no vitals filed for this visit. There is no height or weight on file to calculate BMI.     04/29/2022    2:19 PM 04/04/2020    2:46 PM 10/09/2019    7:14 AM 04/24/2019    9:32 AM 03/29/2019    2:36 PM 03/27/2019    9:51 AM 01/16/2019    7:45 AM  Advanced Directives  Does Patient Have a Medical Advance Directive? No No No No No No No  Would patient like information on creating a medical advance directive? No - Patient declined No - Patient declined No - Patient declined No - Patient declined No - Patient declined No - Patient declined Yes (MAU/Ambulatory/Procedural Areas - Information given)    Current Medications (verified) Outpatient Encounter Medications as of 04/29/2022  Medication Sig   clotrimazole-betamethasone (LOTRISONE) cream APPLY 1 APPLICATION TOPICALLY 2  (TWO) TIMES DAILY.   estradiol (ESTRACE VAGINAL) 0.1 MG/GM vaginal cream Place 1 Applicatorful vaginally 2 (two) times a week.   fluticasone (FLONASE) 50 MCG/ACT nasal spray PLACE 2 SPRAYS INTO BOTH NOSTRILS DAILY. 2(TWO) NASAL EACH NOSTRIL ONCE A DAY   latanoprost (XALATAN) 0.005 % ophthalmic solution Place 1 drop into both eyes at bedtime.   lisinopril (ZESTRIL) 5 MG tablet TAKE 1 TABLET BY MOUTH EVERY DAY   montelukast (SINGULAIR) 10 MG tablet TAKE 1 TABLET (10 MG TOTAL) BY MOUTH DAILY. FOR ALLERGIES   Omega-3 Fatty Acids (FISH OIL) 1000 MG CAPS Take 4 capsules by mouth daily.   omeprazole (PRILOSEC) 40 MG capsule Take 1 capsule (40 mg total) by mouth daily.   simvastatin (ZOCOR) 40 MG tablet TAKE 1 TABLET BY MOUTH EVERYDAY AT BEDTIME   ciclopirox (PENLAC) 8 % solution Apply topically at bedtime. Apply over nail and surrounding skin. Apply daily over previous coat. After seven (7) days, may remove with alcohol and continue cycle. (Patient not taking: Reported on 04/29/2022)   ciprofloxacin-dexamethasone (CIPRODEX) OTIC suspension Place 4 drops into the right ear 2 (two) times daily. X 5-7 days (Patient not taking: Reported on 04/29/2022)   HYDROcodone bit-homatropine (HYCODAN) 5-1.5 MG/5ML syrup Take 5 mLs by mouth every 8 (eight) hours as needed for cough. (Patient not taking: Reported on 04/29/2022)   meloxicam (MOBIC) 7.5 MG tablet Take 1 tablet (7.5 mg total) by mouth daily. (Patient not taking: Reported on 04/29/2022)   Multiple Vitamins-Minerals (PRESERVISION AREDS 2 PO) Take by mouth 2 (two)  times daily.  (Patient not taking: Reported on 04/29/2022)   No facility-administered encounter medications on file as of 04/29/2022.    Allergies (verified) Celecoxib, Metoprolol, Preservision areds [actical], Cefdinir, and Penicillins   History: Past Medical History:  Diagnosis Date   Allergic rhinitis    Allergy    Arthritis    lower back   Hyperlipidemia    Hypertension    Past Surgical  History:  Procedure Laterality Date   ABDOMINAL HYSTERECTOMY     CATARACT EXTRACTION W/ INTRAOCULAR LENS IMPLANT Right    CATARACT EXTRACTION W/PHACO Right 01/16/2019   Procedure: CATARACT EXTRACTION PHACO AND INTRAOCULAR LENS PLACEMENT ([place]) RIGHT;  Surgeon: Deanna Bear, MD;  Location: [facility];  Service: Ophthalmology;  Laterality: Right;   CATARACT EXTRACTION W/PHACO Left 03/27/2019   Procedure: CATARACT EXTRACTION PHACO AND INTRAOCULAR LENS PLACEMENT ([place]) LEFT;  Surgeon: Deanna Bear, MD;  Location: [facility];  Service: Ophthalmology;  Laterality: Left;   COLONOSCOPY     COLONOSCOPY WITH PROPOFOL N/A 04/24/2019   Procedure: COLONOSCOPY WITH PROPOFOL;  Surgeon: Deanna Lame, MD;  Location: [facility];  Service: Endoscopy;  Laterality: N/A;   FLEXIBLE SIGMOIDOSCOPY N/A 10/09/2019   Procedure: FLEXIBLE SIGMOIDOSCOPY WITH BIOPSY;  Surgeon: Deanna Lame, MD;  Location: [facility];  Service: Endoscopy;  Laterality: N/A;   FOOT SURGERY     POLYPECTOMY  04/24/2019   Procedure: POLYPECTOMY;  Surgeon: Deanna Lame, MD;  Location: [employer] [facility];  Service: Endoscopy;;   POLYPECTOMY N/A 10/09/2019   Procedure: POLYPECTOMY;  Surgeon: Deanna Lame, MD;  Location: [place] [facility];  Service: Endoscopy;  Laterality: N/A;   VAGINAL HYSTERECTOMY  1990   Family History  Problem Relation Age of Onset   Cancer Mother    Heart disease Father    Social History   Socioeconomic History   Marital status: Married    Spouse name: Not on file   Number of children: 4   Years of education: Not on file   Highest education level: 11th grade  Occupational History   Occupation: Retired    Comment: keeps granchildren  Tobacco Use   Smoking status: Never   Smokeless tobacco: Never  Vaping Use   Vaping Use: Never used  Substance and Sexual Activity   Alcohol use: Never   Drug use: No   Sexual activity: Not Currently  Other Topics Concern   Not on file   Social History Narrative   ** Merged History Encounter **       Social Determinants of Health   Financial Resource Strain: Low Risk  (04/29/2022)   Overall Financial Resource Strain (CARDIA)    Difficulty of Paying Living Expenses: Not hard at all  Food Insecurity: No Food Insecurity (04/29/2022)   Hunger Vital Sign    Worried About Running Out of Food in the Last Year: Never true    [place] in the Last Year: Never true  Transportation Needs: No Transportation Needs (04/29/2022)   PRAPARE - Hydrologist (Medical): No    Lack of Transportation (Non-Medical): No  Physical Activity: Sufficiently Active (04/29/2022)   Exercise Vital Sign    Days of Exercise per Week: 5 days    Minutes of Exercise per Session: 60 min  Stress: No Stress Concern Present (04/29/2022)   [place]    Feeling of Stress : Not at all  Social Connections: Moderately Integrated (04/29/2022)  Social Licensed conveyancer [NHANES]    Frequency of Communication with Friends and Family: More than three times a week    Frequency of Social Gatherings with Friends and Family: More than three times a week    Attends Religious Services: More than 4 times per year    Active Member of [employer] or Organizations: No    Attends Music therapist: Never    Marital Status: Married    Tobacco Counseling Counseling given: Not Answered   Clinical Intake:  Pre-visit preparation completed: Yes  Pain : No/denies pain     Nutritional Risks: None Diabetes: No  How often do you need to have someone help you when you read instructions, pamphlets, or other written materials from your doctor or pharmacy?: 1 - Never  Diabetic?no  Interpreter Needed?: No  Information entered by :: Kirke Shaggy, LPN   Activities of Daily Living    04/29/2022    2:20 PM 03/18/2022    8:37 AM  In your present state of  health, do you have any difficulty performing the following activities:  Hearing? 1 1  Vision? 0 0  Difficulty concentrating or making decisions? 0 0  Walking or climbing stairs? 0 0  Dressing or bathing? 0 0  Doing errands, shopping? 0 0  Preparing Food and eating ? N   Using the Toilet? N   In the past six months, have you accidently leaked urine? N   Do you have problems with loss of bowel control? N   Managing your Medications? N   Managing your Finances? N   Housekeeping or managing your Housekeeping? N     Patient Care Team: Birdie Sons, MD as PCP - General (Family Medicine) Deanna Bear, MD as Consulting Physician (Ophthalmology) Deanna Lame, MD as Consulting Physician (Gastroenterology)  Indicate any recent Medical Services you may have received from other than [facility] providers in the past year (date may be approximate).     Assessment:   This is a routine wellness examination for Deanna Weaver.  Hearing/Vision screen Hearing Screening - Comments:: No aids Vision Screening - Comments:: Readers-  Eye  Dietary issues and exercise activities discussed: Current Exercise Habits: Home exercise routine, Type of exercise: walking, Time (Minutes): 60, Frequency (Times/Week): 5, Weekly Exercise (Minutes/Week): 300, Intensity: Mild   Goals Addressed             This Visit's Progress    DIET - EAT MORE FRUITS AND VEGETABLES         Depression Screen    04/29/2022    2:18 PM 03/18/2022    8:36 AM 12/08/2021    8:11 AM 04/26/2021   11:22 AM 04/26/2021   11:17 AM 10/17/2020    1:52 PM 06/18/2020    3:48 PM  PHQ 2/9 Scores  PHQ - 2 Score 0 0 0 0 0 0 0  PHQ- 9 Score 0 0 0   1 0    Fall Risk    04/29/2022    2:20 PM 03/18/2022    8:36 AM 12/08/2021    8:10 AM 04/26/2021   11:22 AM 10/17/2020    1:51 PM  Fall Risk   Falls in the past year? 0 0 0 0 0  Number falls in past yr: 0 0 0 0 0  Injury with Fall? 0 0 0 0 0  Risk for fall due to : No Fall Risks No Fall  Risks No Fall Risks No Fall Risks No Fall Risks  Follow up Falls prevention discussed;Falls evaluation completed Falls evaluation completed Falls evaluation completed Falls evaluation completed Falls evaluation completed    FALL RISK PREVENTION PERTAINING TO THE HOME:  Any stairs in or around the home? No  If so, are there any without handrails? No  Home free of loose throw rugs in walkways, pet beds, electrical cords, etc? Yes  Adequate lighting in your home to reduce risk of falls? Yes   ASSISTIVE DEVICES UTILIZED TO PREVENT FALLS:  Life alert? No  Use of a cane, walker or w/c? No  Grab bars in the bathroom? No  Shower chair or bench in shower? No  Elevated toilet seat or a handicapped toilet? No    Cognitive Function:        04/29/2022    2:21 PM 04/26/2021   11:20 AM 10/23/2016    9:09 AM  6CIT Screen  What Year? 0 points 0 points 4 points  What month? 0 points 0 points 0 points  What time? 0 points 0 points 0 points  Count back from 20 0 points 0 points 0 points  Months in reverse 0 points 0 points 0 points  Repeat phrase 0 points 2 points 2 points  Total Score 0 points 2 points 6 points    Immunizations Immunization History  Administered Date(s) Administered   Influenza,inj,Quad PF,6+ Mos 05/30/2019   Pneumococcal Conjugate-13 11/03/2013   Pneumococcal Polysaccharide-23 10/23/2016    TDAP status: Due, Education has been provided regarding the importance of this vaccine. Advised may receive this vaccine at local pharmacy or Health Dept. Aware to provide a copy of the vaccination record if obtained from local pharmacy or Health Dept. Verbalized acceptance and understanding.  Flu Vaccine status: Due, Education has been provided regarding the importance of this vaccine. Advised may receive this vaccine at local pharmacy or Health Dept. Aware to provide a copy of the vaccination record if obtained from local pharmacy or Health Dept. Verbalized acceptance and  understanding.  Pneumococcal vaccine status: Up to date  Covid-19 vaccine status: Declined, Education has been provided regarding the importance of this vaccine but patient still declined. Advised may receive this vaccine at local pharmacy or Health Dept.or vaccine clinic. Aware to provide a copy of the vaccination record if obtained from local pharmacy or Health Dept. Verbalized acceptance and understanding.  Qualifies for Shingles Vaccine? Yes   Zostavax completed No   Shingrix Completed?: No.    Education has been provided regarding the importance of this vaccine. Patient has been advised to call insurance company to determine out of pocket expense if they have not yet received this vaccine. Advised may also receive vaccine at local pharmacy or Health Dept. Verbalized acceptance and understanding.  Screening Tests Health Maintenance  Topic Date Due   COVID-19 Vaccine (1) Never done   Zoster Vaccines- Shingrix (1 of 2) Never done   INFLUENZA VACCINE  03/03/2022   TETANUS/TDAP  08/03/2026 (Originally 05/01/1965)   COLONOSCOPY (Pts 45-42yr Insurance coverage will need to be confirmed)  04/23/2024   Pneumonia Vaccine 76 Years old  Completed   DEXA SCAN  Completed   Hepatitis C Screening  Completed   HPV VACCINES  Aged Out    Health Maintenance  Health Maintenance Due  Topic Date Due   COVID-19 Vaccine (1) Never done   Zoster Vaccines- Shingrix (1 of 2) Never done   INFLUENZA VACCINE  03/03/2022    Colorectal cancer screening: No longer required.   Mammogram status: No longer required due  to age.- had one 03/30/22  Bone Density status: Completed 12/13/17. Results reflect: Bone density results: NORMAL. Repeat every 5 years.  Lung Cancer Screening: (Low Dose CT Chest recommended if Age 38-80 years, 30 pack-year currently smoking OR have quit w/in 15years.) does not qualify.   Additional Screening:  Hepatitis C Screening: does qualify; Completed 10/23/16  Vision Screening:  Recommended annual ophthalmology exams for early detection of glaucoma and other disorders of the eye. Is the patient up to date with their annual eye exam?  Yes  Who is the provider or what is the name of the office in which the patient attends annual eye exams? [place] If pt is not established with a provider, would they like to be referred to a provider to establish care? No .   Dental Screening: Recommended annual dental exams for proper oral hygiene  Community Resource Referral / Chronic Care Management: CRR required this visit?  No   CCM required this visit?  No      Plan:     I have personally reviewed and noted the following in the patient's chart:   Medical and social history Use of alcohol, tobacco or illicit drugs  Current medications and supplements including opioid prescriptions. Patient is not currently taking opioid prescriptions. Functional ability and status Nutritional status Physical activity Advanced directives List of other physicians Hospitalizations, surgeries, and ER visits in previous 12 months Vitals Screenings to include cognitive, depression, and falls Referrals and appointments  In addition, I have reviewed and discussed with patient certain preventive protocols, quality metrics, and best practice recommendations. A written personalized care plan for preventive services as well as general preventive health recommendations were provided to patient.     Deanna David, LPN   [id number]   Nurse Notes: none

## 2022-04-29 NOTE — Patient Instructions (Signed)
Deanna Weaver , Thank you for taking time to come for your Medicare Wellness Visit. I appreciate your ongoing commitment to your health goals. Please review the following plan we discussed and let me know if I can assist you in the future.   Screening recommendations/referrals: Colonoscopy: aged out Mammogram: aged out Bone Density: 12/13/17 Recommended yearly ophthalmology/optometry visit for glaucoma screening and checkup Recommended yearly dental visit for hygiene and checkup  Vaccinations: Influenza vaccine: n/d Pneumococcal vaccine: 10/23/16 Tdap vaccine: n/d Shingles vaccine: n/d   Covid-19:n/d  Advanced directives: no  Conditions/risks identified: none  Next appointment: Follow up in one year for your annual wellness visit 05/03/23 @ 10:45 am by phone   Preventive Care 65 Years and Older, Female Preventive care refers to lifestyle choices and visits with your health care provider that can promote health and wellness. What does preventive care include? A yearly physical exam. This is also called an annual well check. Dental exams once or twice a year. Routine eye exams. Ask your health care provider how often you should have your eyes checked. Personal lifestyle choices, including: Daily care of your teeth and gums. Regular physical activity. Eating a healthy diet. Avoiding tobacco and drug use. Limiting alcohol use. Practicing safe sex. Taking low-dose aspirin every day. Taking vitamin and mineral supplements as recommended by your health care provider. What happens during an annual well check? The services and screenings done by your health care provider during your annual well check will depend on your age, overall health, lifestyle risk factors, and family history of disease. Counseling  Your health care provider may ask you questions about your: Alcohol use. Tobacco use. Drug use. Emotional well-being. Home and relationship well-being. Sexual activity. Eating  habits. History of falls. Memory and ability to understand (cognition). Work and work Statistician. Reproductive health. Screening  You may have the following tests or measurements: Height, weight, and BMI. Blood pressure. Lipid and cholesterol levels. These may be checked every 5 years, or more frequently if you are over 36 years old. Skin check. Lung cancer screening. You may have this screening every year starting at age 76 if you have a 30-pack-year history of smoking and currently smoke or have quit within the past 15 years. Fecal occult blood test (FOBT) of the stool. You may have this test every year starting at age 52. Flexible sigmoidoscopy or colonoscopy. You may have a sigmoidoscopy every 5 years or a colonoscopy every 10 years starting at age 1. Hepatitis C blood test. Hepatitis B blood test. Sexually transmitted disease (STD) testing. Diabetes screening. This is done by checking your blood sugar (glucose) after you have not eaten for a while (fasting). You may have this done every 1-3 years. Bone density scan. This is done to screen for osteoporosis. You may have this done starting at age 28. Mammogram. This may be done every 1-2 years. Talk to your health care provider about how often you should have regular mammograms. Talk with your health care provider about your test results, treatment options, and if necessary, the need for more tests. Vaccines  Your health care provider may recommend certain vaccines, such as: Influenza vaccine. This is recommended every year. Tetanus, diphtheria, and acellular pertussis (Tdap, Td) vaccine. You may need a Td booster every 10 years. Zoster vaccine. You may need this after age 67. Pneumococcal 13-valent conjugate (PCV13) vaccine. One dose is recommended after age 64. Pneumococcal polysaccharide (PPSV23) vaccine. One dose is recommended after age 39. Talk to your health care  provider about which screenings and vaccines you need and how  often you need them. This information is not intended to replace advice given to you by your health care provider. Make sure you discuss any questions you have with your health care provider. Document Released: 08/16/2015 Document Revised: 04/08/2016 Document Reviewed: 05/21/2015 Elsevier Interactive Patient Education  2017 [place] Prevention in the Home Falls can cause injuries. They can happen to people of all ages. There are many things you can do to make your home safe and to help prevent falls. What can I do on the outside of my home? Regularly fix the edges of walkways and driveways and fix any cracks. Remove anything that might make you trip as you walk through a door, such as a raised step or threshold. Trim any bushes or trees on the path to your home. Use bright outdoor lighting. Clear any walking paths of anything that might make someone trip, such as rocks or tools. Regularly check to see if handrails are loose or broken. Make sure that both sides of any steps have handrails. Any raised decks and porches should have guardrails on the edges. Have any leaves, snow, or ice cleared regularly. Use sand or salt on walking paths during winter. Clean up any spills in your garage right away. This includes oil or grease spills. What can I do in the bathroom? Use night lights. Install grab bars by the toilet and in the tub and shower. Do not use towel bars as grab bars. Use non-skid mats or decals in the tub or shower. If you need to sit down in the shower, use a plastic, non-slip stool. Keep the floor dry. Clean up any water that spills on the floor as soon as it happens. Remove soap buildup in the tub or shower regularly. Attach bath mats securely with double-sided non-slip rug tape. Do not have throw rugs and other things on the floor that can make you trip. What can I do in the bedroom? Use night lights. Make sure that you have a light by your bed that is easy to  reach. Do not use any sheets or blankets that are too big for your bed. They should not hang down onto the floor. Have a firm chair that has side arms. You can use this for support while you get dressed. Do not have throw rugs and other things on the floor that can make you trip. What can I do in the kitchen? Clean up any spills right away. Avoid walking on wet floors. Keep items that you use a lot in easy-to-reach places. If you need to reach something above you, use a strong step stool that has a grab bar. Keep electrical cords out of the way. Do not use floor polish or wax that makes floors slippery. If you must use wax, use non-skid floor wax. Do not have throw rugs and other things on the floor that can make you trip. What can I do with my stairs? Do not leave any items on the stairs. Make sure that there are handrails on both sides of the stairs and use them. Fix handrails that are broken or loose. Make sure that handrails are as long as the stairways. Check any carpeting to make sure that it is firmly attached to the stairs. Fix any carpet that is loose or worn. Avoid having throw rugs at the top or bottom of the stairs. If you do have throw rugs, attach them to the  floor with carpet tape. Make sure that you have a light switch at the top of the stairs and the bottom of the stairs. If you do not have them, ask someone to add them for you. What else can I do to help prevent falls? Wear shoes that: Do not have high heels. Have rubber bottoms. Are comfortable and fit you well. Are closed at the toe. Do not wear sandals. If you use a stepladder: Make sure that it is fully opened. Do not climb a closed stepladder. Make sure that both sides of the stepladder are locked into place. Ask someone to hold it for you, if possible. Clearly mark and make sure that you can see: Any grab bars or handrails. First and last steps. Where the edge of each step is. Use tools that help you move  around (mobility aids) if they are needed. These include: Canes. Walkers. Scooters. Crutches. Turn on the lights when you go into a dark area. Replace any light bulbs as soon as they burn out. Set up your furniture so you have a clear path. Avoid moving your furniture around. If any of your floors are uneven, fix them. If there are any pets around you, be aware of where they are. Review your medicines with your doctor. Some medicines can make you feel dizzy. This can increase your chance of falling. Ask your doctor what other things that you can do to help prevent falls. This information is not intended to replace advice given to you by your health care provider. Make sure you discuss any questions you have with your health care provider. Document Released: 05/16/2009 Document Revised: 12/26/2015 Document Reviewed: 08/24/2014 Elsevier Interactive Patient Education  2017 [employer].

## 2022-05-13 ENCOUNTER — Encounter: Payer: Self-pay | Admitting: Physician Assistant

## 2022-05-13 ENCOUNTER — Ambulatory Visit (INDEPENDENT_AMBULATORY_CARE_PROVIDER_SITE_OTHER): Payer: Medicare Other | Admitting: Physician Assistant

## 2022-05-13 VITALS — BP 139/75 | HR 61 | Temp 98.3°F | Resp 16 | Wt 144.0 lb

## 2022-05-13 DIAGNOSIS — H66001 Acute suppurative otitis media without spontaneous rupture of ear drum, right ear: Secondary | ICD-10-CM | POA: Diagnosis not present

## 2022-05-13 MED ORDER — DOXYCYCLINE HYCLATE 100 MG PO TABS: 100.0000 mg | ORAL_TABLET | Freq: Two times a day (BID) | ORAL | 0 refills | Status: AC

## 2022-05-13 NOTE — Progress Notes (Signed)
I,Roshena L Chambers,acting as a scribe for [employer], PA-C.,have documented all relevant documentation on the behalf of Mikey Kirschner, PA-C,as directed by  Mikey Kirschner, PA-C while in the presence of Mikey Kirschner, PA-C.   Established patient visit   Patient: Deanna Weaver   DOB: 05/07/1946   76 y.o. Female  MRN: [id number] Visit Date: 05/13/2022  Today's healthcare provider: Mikey Kirschner, PA-C   Chief Complaint  Patient presents with   Ear Pain   Subjective    Otalgia  There is pain in the right ear. This is a new problem. Episode onset: 3 days ago. The problem has been gradually worsening. There has been no fever. Pertinent negatives include no abdominal pain, rhinorrhea, sore throat or vomiting. She has tried ear drops and acetaminophen for the symptoms. The treatment provided no relief.   Reports increased nasal congestion and runny nose. She is consistent w/ antihistamine and flonase otc.  Denies fevers, chills.   Medications: Outpatient Medications Prior to Visit  Medication Sig   ciclopirox (PENLAC) 8 % solution Apply topically at bedtime. Apply over nail and surrounding skin. Apply daily over previous coat. After seven (7) days, may remove with alcohol and continue cycle.   ciprofloxacin-dexamethasone (CIPRODEX) OTIC suspension Place 4 drops into the right ear 2 (two) times daily. X 5-7 days   clotrimazole-betamethasone (LOTRISONE) cream APPLY 1 APPLICATION TOPICALLY 2 (TWO) TIMES DAILY.   estradiol (ESTRACE VAGINAL) 0.1 MG/GM vaginal cream Place 1 Applicatorful vaginally 2 (two) times a week.   fluticasone (FLONASE) 50 MCG/ACT nasal spray PLACE 2 SPRAYS INTO BOTH NOSTRILS DAILY. 2(TWO) NASAL EACH NOSTRIL ONCE A DAY   HYDROcodone bit-homatropine (HYCODAN) 5-1.5 MG/5ML syrup Take 5 mLs by mouth every 8 (eight) hours as needed for cough.   latanoprost (XALATAN) 0.005 % ophthalmic solution Place 1 drop into both eyes at bedtime.   lisinopril (ZESTRIL) 5 MG  tablet TAKE 1 TABLET BY MOUTH EVERY DAY   meloxicam (MOBIC) 7.5 MG tablet Take 1 tablet (7.5 mg total) by mouth daily.   montelukast (SINGULAIR) 10 MG tablet TAKE 1 TABLET (10 MG TOTAL) BY MOUTH DAILY. FOR ALLERGIES   Multiple Vitamins-Minerals (PRESERVISION AREDS 2 PO) Take by mouth 2 (two) times daily.   Omega-3 Fatty Acids (FISH OIL) 1000 MG CAPS Take 4 capsules by mouth daily.   omeprazole (PRILOSEC) 40 MG capsule Take 1 capsule (40 mg total) by mouth daily.   simvastatin (ZOCOR) 40 MG tablet TAKE 1 TABLET BY MOUTH EVERYDAY AT BEDTIME   No facility-administered medications prior to visit.    Review of Systems  Constitutional:  Negative for appetite change, chills, fatigue and fever.  HENT:  Positive for ear pain. Negative for rhinorrhea and sore throat.   Respiratory:  Negative for chest tightness and shortness of breath.   Cardiovascular:  Negative for chest pain and palpitations.  Gastrointestinal:  Negative for abdominal pain, nausea and vomiting.  Neurological:  Negative for dizziness and weakness.     Objective    BP 139/75 (BP Location: Left Arm, Patient Position: Sitting, Cuff Size: Normal)   Pulse 61   Temp 98.3 F (36.8 C) (Oral)   Resp 16   Wt 144 lb (65.3 kg)   SpO2 100% Comment: room air  BMI 27.21 kg/m   Physical Exam Vitals reviewed.  Constitutional:      Appearance: She is not ill-appearing.  HENT:     Head: Normocephalic.     Right Ear: Tympanic membrane is bulging.  Left Ear: Tympanic membrane normal.  Eyes:     Conjunctiva/sclera: Conjunctivae normal.  Cardiovascular:     Rate and Rhythm: Normal rate.  Pulmonary:     Effort: Pulmonary effort is normal. No respiratory distress.  Neurological:     General: No focal deficit present.     Mental Status: She is alert and oriented to person, place, and time.  Psychiatric:        Mood and Affect: Mood normal.        Behavior: Behavior normal.      No results found for any visits on 05/13/22.   Assessment & Plan     Right OM Advised increase fluids, stay consistent w/ flonase can use BID and antihistamine. Can add saline nasal spray/rinse OTC. Will rx doxycyline bid x 7 days d/t PCN allergy   If no improvement by next week please call/message office  I, Mikey Kirschner, PA-C have reviewed all documentation for this visit. The documentation on  05/13/2022  for the exam, diagnosis, procedures, and orders are all accurate and complete.  Mikey Kirschner, PA-C [facility] [address] [place], [place], [address] Office: [phone] Fax: [place]

## 2022-06-04 DIAGNOSIS — H401131 Primary open-angle glaucoma, bilateral, mild stage: Secondary | ICD-10-CM | POA: Diagnosis not present

## 2022-06-13 DIAGNOSIS — N3001 Acute cystitis with hematuria: Secondary | ICD-10-CM | POA: Diagnosis not present

## 2022-06-13 DIAGNOSIS — R350 Frequency of micturition: Secondary | ICD-10-CM | POA: Diagnosis not present

## 2022-06-18 NOTE — Progress Notes (Signed)
I,Roshena L Chambers,acting as a scribe for Lelon Huh, MD.,have documented all relevant documentation on the behalf of Lelon Huh, MD,as directed by  Lelon Huh, MD while in the presence of Lelon Huh, MD.    Established patient visit   Patient: Deanna Weaver   DOB: [birth year]   76 y.o. Female  MRN: [id number] Visit Date: 06/19/2022  Today's healthcare provider: Lelon Huh, MD   Chief Complaint  Patient presents with   Hypertension   Subjective    HPI  Hypertension, follow-up  BP Readings from Last 3 Encounters:  06/19/22 137/63  05/13/22 139/75  03/24/22 134/72   Wt Readings from Last 3 Encounters:  06/19/22 144 lb (65.3 kg)  05/13/22 144 lb (65.3 kg)  04/29/22 149 lb (67.6 kg)     She was last seen for hypertension 6 months ago.  BP at that visit was 138/86. Management since that visit includes advising patient to avoid salty foods and exercise 30 minutes every day. Continue same medication.  She reports good compliance with treatment. She is not having side effects.  She is following a Regular diet. She is exercising. She does not smoke.  Use of agents associated with hypertension: none.   Outside blood pressures are not checked. Symptoms: No chest pain No chest pressure  No palpitations No syncope  No dyspnea No orthopnea  No paroxysmal nocturnal dyspnea No lower extremity edema   Pertinent labs Lab Results  Component Value Date   CHOL 155 12/08/2021   HDL 35 (L) 12/08/2021   LDLCALC 87 12/08/2021   TRIG 192 (H) 12/08/2021   CHOLHDL 4.4 12/08/2021   Lab Results  Component Value Date   NA 143 12/08/2021   K 5.1 12/08/2021   CREATININE 0.72 12/08/2021   EGFR 87 12/08/2021   GLUCOSE 114 (H) 12/08/2021     The 10-year ASCVD risk score (Arnett DK, et al., 2019) is: 25.3%  ---------------------------------------------------------------------------------------------------  Is also here to follow up on elevated blood sugar which was  mildly elevated on labs from May. Has been working on reducing sweets and starchy foods in diet since then.  Medications: Outpatient Medications Prior to Visit  Medication Sig   ciclopirox (PENLAC) 8 % solution Apply topically at bedtime. Apply over nail and surrounding skin. Apply daily over previous coat. After seven (7) days, may remove with alcohol and continue cycle.   clotrimazole-betamethasone (LOTRISONE) cream APPLY 1 APPLICATION TOPICALLY 2 (TWO) TIMES DAILY.   estradiol (ESTRACE VAGINAL) 0.1 MG/GM vaginal cream Place 1 Applicatorful vaginally 2 (two) times a week.   fluticasone (FLONASE) 50 MCG/ACT nasal spray PLACE 2 SPRAYS INTO BOTH NOSTRILS DAILY. 2(TWO) NASAL EACH NOSTRIL ONCE A DAY   HYDROcodone bit-homatropine (HYCODAN) 5-1.5 MG/5ML syrup Take 5 mLs by mouth every 8 (eight) hours as needed for cough.   latanoprost (XALATAN) 0.005 % ophthalmic solution Place 1 drop into both eyes at bedtime.   lisinopril (ZESTRIL) 5 MG tablet TAKE 1 TABLET BY MOUTH EVERY DAY   meloxicam (MOBIC) 7.5 MG tablet Take 1 tablet (7.5 mg total) by mouth daily.   montelukast (SINGULAIR) 10 MG tablet TAKE 1 TABLET (10 MG TOTAL) BY MOUTH DAILY. FOR ALLERGIES   Multiple Vitamins-Minerals (PRESERVISION AREDS 2 PO) Take by mouth 2 (two) times daily.   Omega-3 Fatty Acids (FISH OIL) 1000 MG CAPS Take 4 capsules by mouth daily.   omeprazole (PRILOSEC) 40 MG capsule Take 1 capsule (40 mg total) by mouth daily.   simvastatin (ZOCOR) 40 MG  tablet TAKE 1 TABLET BY MOUTH EVERYDAY AT BEDTIME   ciprofloxacin-dexamethasone (CIPRODEX) OTIC suspension Place 4 drops into the right ear 2 (two) times daily. X 5-7 days (Patient not taking: Reported on 06/19/2022)   No facility-administered medications prior to visit.    Review of Systems  Constitutional:  Negative for appetite change, chills, fatigue and fever.  Respiratory:  Negative for chest tightness and shortness of breath.   Cardiovascular:  Negative for chest pain  and palpitations.  Gastrointestinal:  Negative for abdominal pain, nausea and vomiting.  Neurological:  Negative for dizziness and weakness.       Objective    BP 137/63 (BP Location: Right Arm, Patient Position: Sitting, Cuff Size: Large)   Pulse 67   Temp (!) 97.5 F (36.4 C) (Oral)   Resp 16   Wt 144 lb (65.3 kg)   SpO2 97% Comment: room air  BMI 27.21 kg/m     Results for orders placed or performed in visit on 06/19/22  POCT HgB A1C  Result Value Ref Range   Hemoglobin A1C 5.7 (A) 4.0 - 5.6 %   Est. average glucose Bld gHb Est-mCnc 117     Assessment & Plan     1. Primary hypertension Fairly well controlled on current medications. Continue current medications.    2. Hyperglycemia Continue low sugar diet, check A1c 1-2x /year      The entirety of the information documented in the History of Present Illness, Review of Systems and Physical Exam were personally obtained by me. Portions of this information were initially documented by the CMA and reviewed by me for thoroughness and accuracy.     Lelon Huh, MD  [facility] [phone] (phone) [phone] (fax)  [place]

## 2022-06-19 ENCOUNTER — Encounter: Payer: Self-pay | Admitting: Family Medicine

## 2022-06-19 ENCOUNTER — Ambulatory Visit (INDEPENDENT_AMBULATORY_CARE_PROVIDER_SITE_OTHER): Payer: Medicare Other | Admitting: Family Medicine

## 2022-06-19 VITALS — BP 137/63 | HR 67 | Temp 97.5°F | Resp 16 | Wt 144.0 lb

## 2022-06-19 DIAGNOSIS — I10 Essential (primary) hypertension: Secondary | ICD-10-CM | POA: Diagnosis not present

## 2022-06-19 DIAGNOSIS — R739 Hyperglycemia, unspecified: Secondary | ICD-10-CM | POA: Diagnosis not present

## 2022-06-19 LAB — POCT GLYCOSYLATED HEMOGLOBIN (HGB A1C)
Est. average glucose Bld gHb Est-mCnc: 117
Hemoglobin A1C: 5.7 % — AB (ref 4.0–5.6)

## 2022-06-19 NOTE — Patient Instructions (Signed)
.   Please review the attached list of medications and notify my office if there are any errors.   . Please bring all of your medications to every appointment so we can make sure that our medication list is the same as yours.   

## 2022-08-06 ENCOUNTER — Ambulatory Visit (INDEPENDENT_AMBULATORY_CARE_PROVIDER_SITE_OTHER): Payer: Medicare Other | Admitting: Physician Assistant

## 2022-08-06 ENCOUNTER — Encounter: Payer: Self-pay | Admitting: Physician Assistant

## 2022-08-06 ENCOUNTER — Ambulatory Visit: Payer: Self-pay

## 2022-08-06 VITALS — BP 129/76 | HR 74 | Temp 98.1°F | Ht 61.0 in | Wt 143.2 lb

## 2022-08-06 DIAGNOSIS — N3000 Acute cystitis without hematuria: Secondary | ICD-10-CM | POA: Diagnosis not present

## 2022-08-06 DIAGNOSIS — R300 Dysuria: Secondary | ICD-10-CM | POA: Diagnosis not present

## 2022-08-06 DIAGNOSIS — R350 Frequency of micturition: Secondary | ICD-10-CM | POA: Diagnosis not present

## 2022-08-06 MED ORDER — CIPROFLOXACIN HCL 250 MG PO TABS: 250.0000 mg | ORAL_TABLET | Freq: Two times a day (BID) | ORAL | 0 refills | Status: AC

## 2022-08-06 NOTE — Progress Notes (Signed)
I,Sha'taria Tyson,acting as a Education administrator for [employer], PA-C.,have documented all relevant documentation on the behalf of Mikey Kirschner, PA-C,as directed by  Mikey Kirschner, PA-C while in the presence of Mikey Kirschner, PA-C.   Established patient visit   Patient: Deanna Weaver   DOB: 1945/12/30   77 y.o. Female  MRN: [id number] Visit Date: 08/06/2022  Today's healthcare provider: Mikey Kirschner, PA-C   Cc. Dysuria and frequency  Subjective     Pt reports urinary frequency .dysuria at an 8/10 for the last two days. She denies abdominal or lower back pain. Denies hematuria. She is not sexually active. She is taking AZO to treat the symptoms. Denies fever Reports GI illness with nausea and vomiting a few days before feeling symptoms. Reports her Gi symptoms have resolved.  Medications: Outpatient Medications Prior to Visit  Medication Sig   ciclopirox (PENLAC) 8 % solution Apply topically at bedtime. Apply over nail and surrounding skin. Apply daily over previous coat. After seven (7) days, may remove with alcohol and continue cycle.   clotrimazole-betamethasone (LOTRISONE) cream APPLY 1 APPLICATION TOPICALLY 2 (TWO) TIMES DAILY.   CRANBERRY PO Take by mouth 2 (two) times daily.   estradiol (ESTRACE VAGINAL) 0.1 MG/GM vaginal cream Place 1 Applicatorful vaginally 2 (two) times a week.   fluticasone (FLONASE) 50 MCG/ACT nasal spray PLACE 2 SPRAYS INTO BOTH NOSTRILS DAILY. 2(TWO) NASAL EACH NOSTRIL ONCE A DAY   HYDROcodone bit-homatropine (HYCODAN) 5-1.5 MG/5ML syrup Take 5 mLs by mouth every 8 (eight) hours as needed for cough.   latanoprost (XALATAN) 0.005 % ophthalmic solution Place 1 drop into both eyes at bedtime.   lisinopril (ZESTRIL) 5 MG tablet TAKE 1 TABLET BY MOUTH EVERY DAY   meloxicam (MOBIC) 7.5 MG tablet Take 1 tablet (7.5 mg total) by mouth daily.   montelukast (SINGULAIR) 10 MG tablet TAKE 1 TABLET (10 MG TOTAL) BY MOUTH DAILY. FOR ALLERGIES   Multiple  Vitamins-Minerals (PRESERVISION AREDS 2 PO) Take by mouth 2 (two) times daily.   Omega-3 Fatty Acids (FISH OIL) 1000 MG CAPS Take 4 capsules by mouth daily.   omeprazole (PRILOSEC) 40 MG capsule Take 1 capsule (40 mg total) by mouth daily.   simvastatin (ZOCOR) 40 MG tablet TAKE 1 TABLET BY MOUTH EVERYDAY AT BEDTIME   No facility-administered medications prior to visit.    Review of Systems  Genitourinary:  Positive for frequency and urgency.     Objective    Blood pressure 129/76, pulse 74, temperature 98.1 F (36.7 C), temperature source Oral, height '5\' 1"'$  (1.549 m), weight 143 lb 3.2 oz (65 kg), SpO2 100 %.   Physical Exam Vitals reviewed.  Constitutional:      Appearance: She is not ill-appearing.  HENT:     Head: Normocephalic.  Eyes:     Conjunctiva/sclera: Conjunctivae normal.  Cardiovascular:     Rate and Rhythm: Normal rate.  Pulmonary:     Effort: Pulmonary effort is normal. No respiratory distress.  Abdominal:     Palpations: Abdomen is soft.     Tenderness: There is no abdominal tenderness. There is no right CVA tenderness or left CVA tenderness.  Neurological:     General: No focal deficit present.     Mental Status: She is alert and oriented to person, place, and time.  Psychiatric:        Mood and Affect: Mood normal.        Behavior: Behavior normal.      No results found  for any visits on 08/06/22.  Assessment & Plan     Acute cystitis Unable to complete a UA 2/2 AZO  Will treat empirically rx cipro 250 mg bid x 5 days -- given recent infection 06/2022 .  Based on sensitivity from prior culture, cipro should be effective.   Return if symptoms worsen or fail to improve.     I, Mikey Kirschner, PA-C have reviewed all documentation for this visit. The documentation on  08/06/2022  for the exam, diagnosis, procedures, and orders are all accurate and complete.  Mikey Kirschner, PA-C [facility] [address] [place], [place],  [address] Office: [phone] Fax: [place]

## 2022-08-06 NOTE — Telephone Encounter (Signed)
Pt called and appt made for 08/06/22

## 2022-08-06 NOTE — Telephone Encounter (Signed)
  Chief Complaint: UTI Symptoms: urinary pain and urgency  Frequency: yesterday Pertinent Negatives: Patient denies flank pain Disposition: '[]'$ ED /'[]'$ Urgent Care (no appt availability in office) / '[]'$ Appointment(In office/virtual)/ '[]'$  [facility]/ '[]'$ Home Care/ '[x]'$ Refused Recommended Disposition /'[]'$  Mobile Bus/ '[x]'$  Follow-up with PCP Additional Notes: pt states she didn't want to come to practice d/t just getting over stomach virus and pt preferred to not see anyone but Dr. Caryn Section, advised that he was off today and tried doing telephone visit with Ria Comment, [place] today but nothing available. Advised pt I would send message back and have her nurse FU. Pt verbalized understanding.   Summary: urinary discomfort   The patient would like to speak with a member of clinical staff when possible regarding their urinary discomfort  The patient has felt unwell since yesterday 08/322  Please contact further when possible     Reason for Disposition  Urinating more frequently than usual (i.e., frequency)  Answer Assessment - Initial Assessment Questions 1. SYMPTOM: "What's the main symptom you're concerned about?" (e.g., frequency, incontinence)     Pain, urgency  2. ONSET: "When did the  sx  start?"     08/05/22 3. PAIN: "Is there any pain?" If Yes, ask: "How bad is it?" (Scale: 1-10; mild, moderate, severe)     Mild to moderate  4. CAUSE: "What do you think is causing the symptoms?"     UTI  5. OTHER SYMPTOMS: "Do you have any other symptoms?" (e.g., blood in urine, fever, flank pain, pain with urination)  Protocols used: Urinary Symptoms-A-AH

## 2022-08-10 LAB — URINE CULTURE

## 2022-08-24 DIAGNOSIS — M720 Palmar fascial fibromatosis [Dupuytren]: Secondary | ICD-10-CM | POA: Diagnosis not present

## 2022-08-30 ENCOUNTER — Other Ambulatory Visit: Payer: Self-pay | Admitting: Family Medicine

## 2022-08-30 IMAGING — CR DG CERVICAL SPINE COMPLETE 4+V
1 series · 5 of 5 positions shown · non-contrast
Comparison: None Available.

CLINICAL DATA: Cervicalgia. Neck pain. Patient reports pain for 2-3
months.

EXAM:
CERVICAL SPINE - COMPLETE 4+ VIEW

[Series 1: dg cervical spine complete · 0.14mm/px · 5 of 5 slices shown]
[im 1/5]
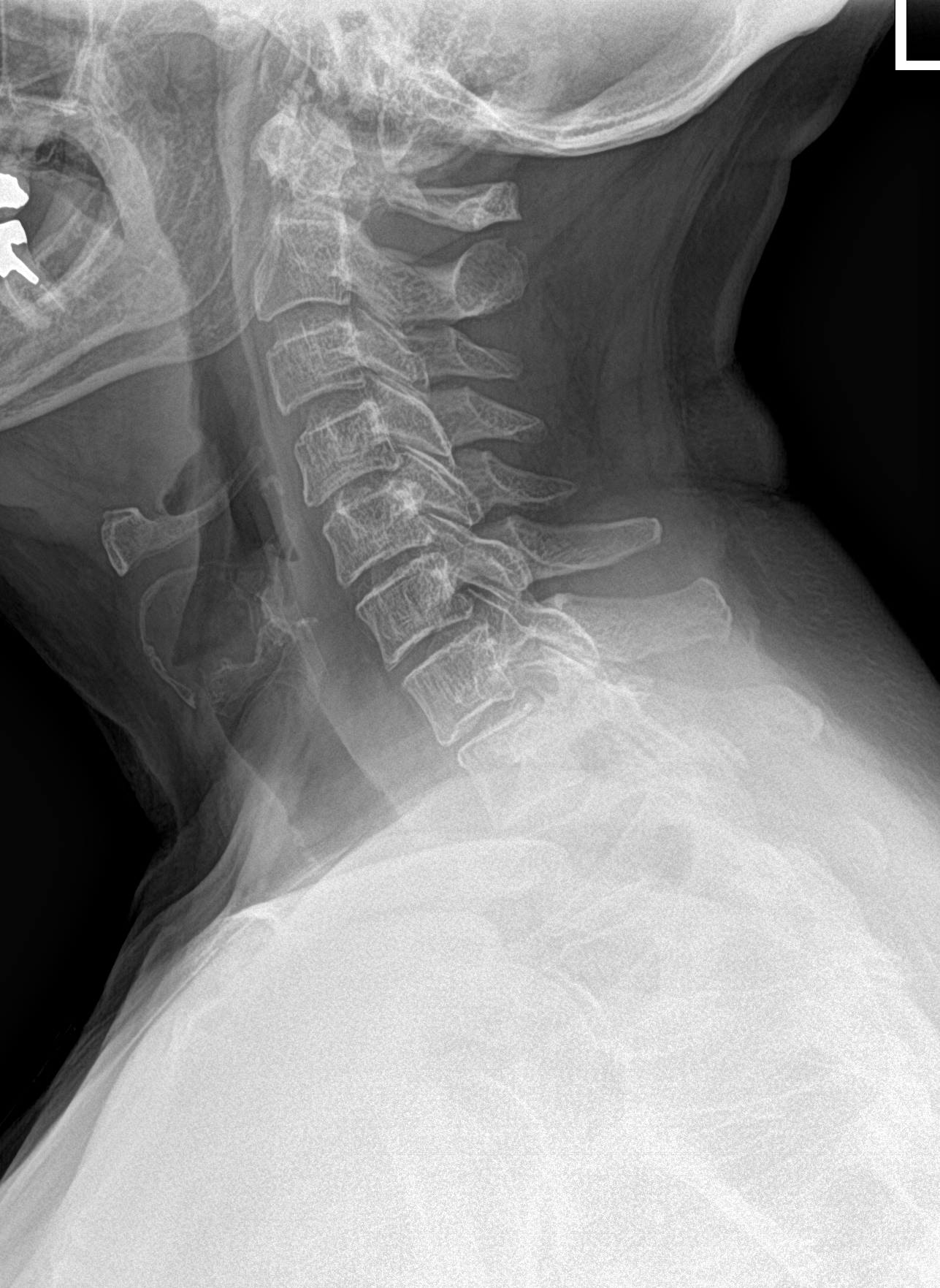
[im 2/5]
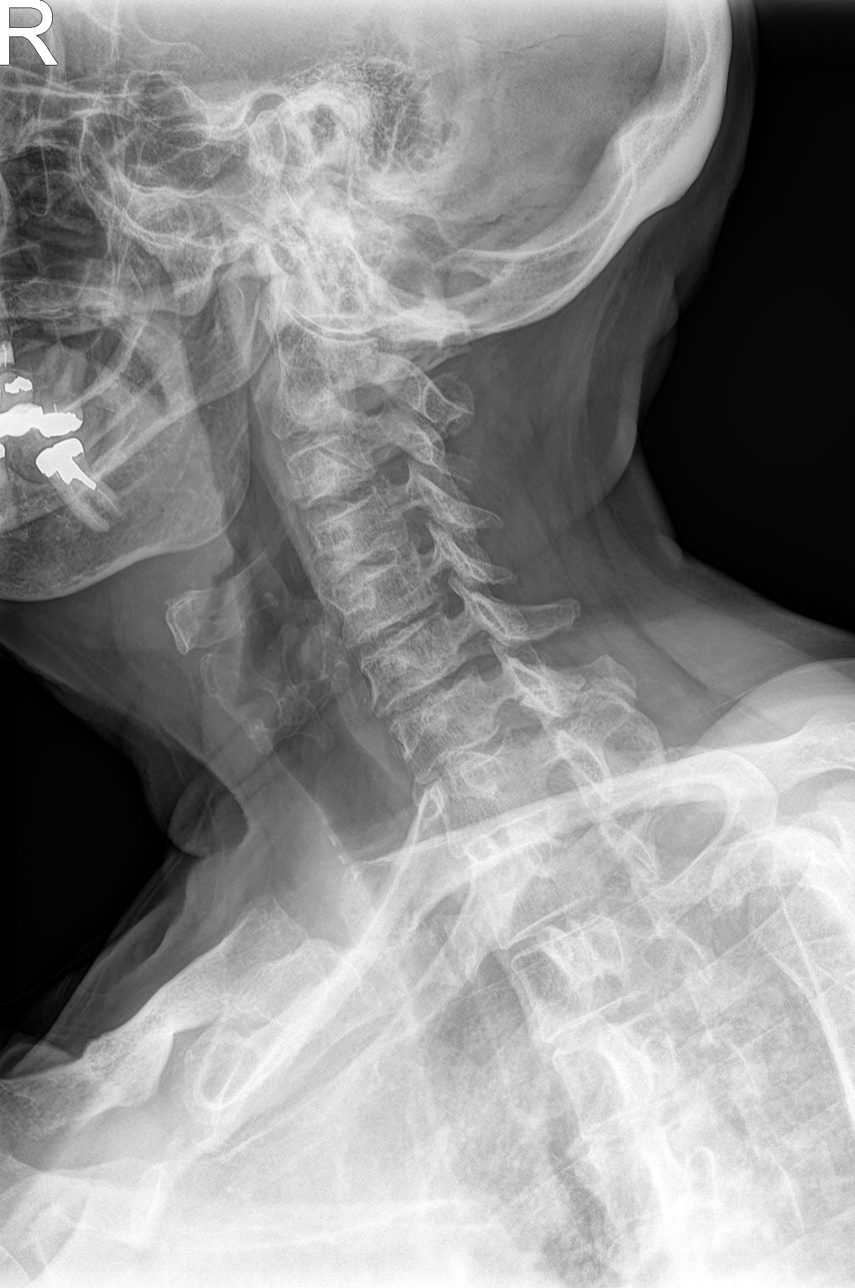
[im 3/5]
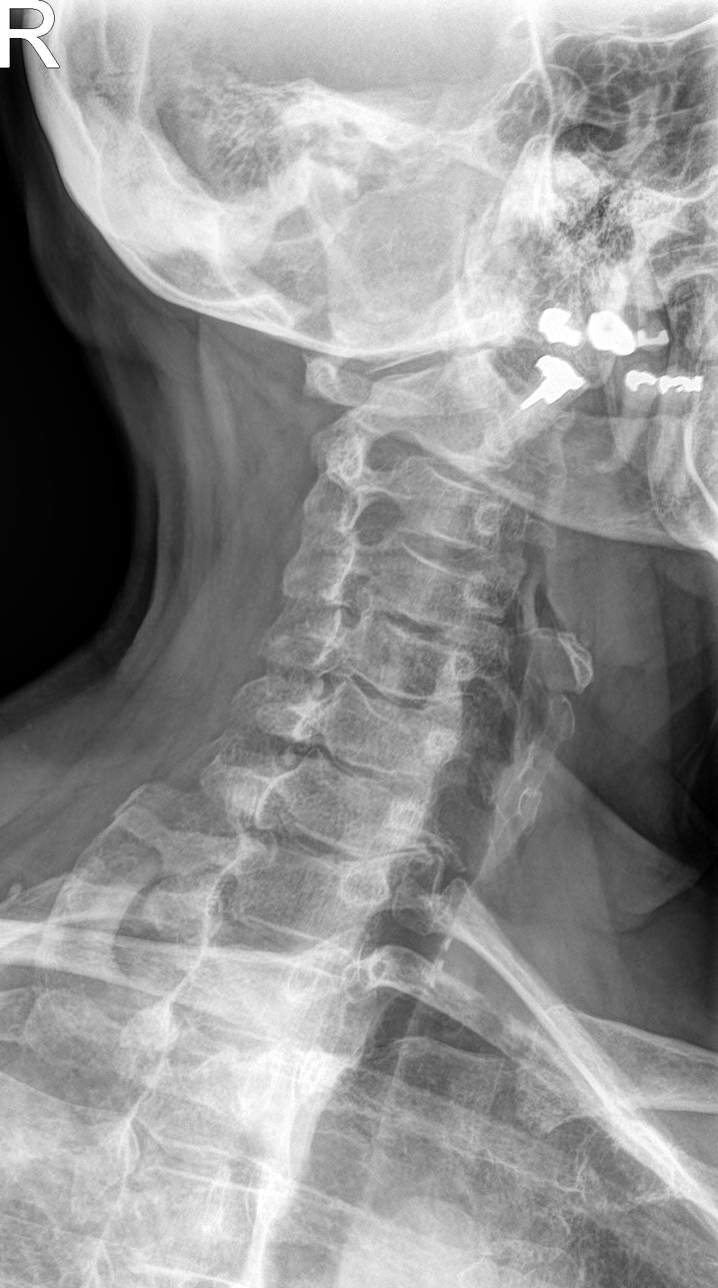
[im 4/5]
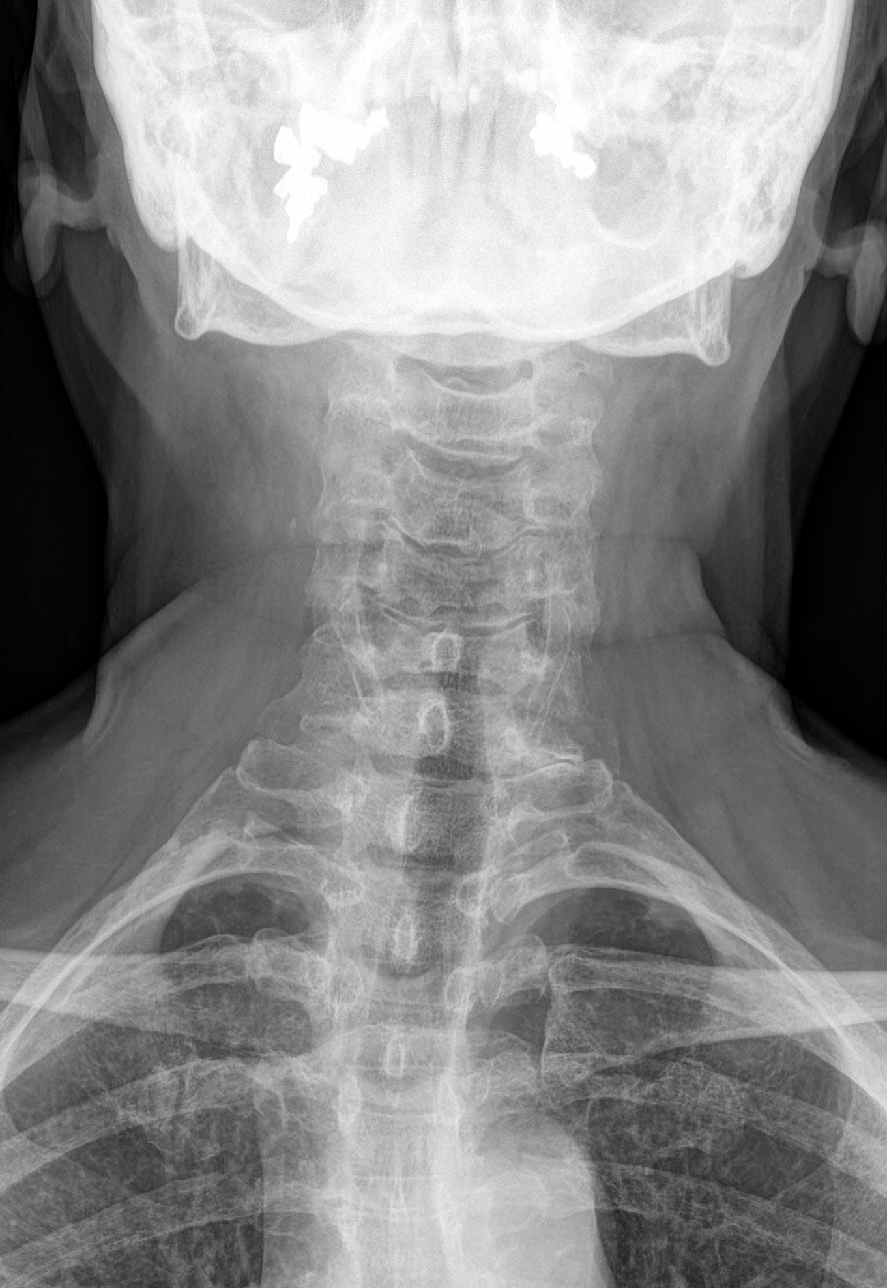
[im 5/5]
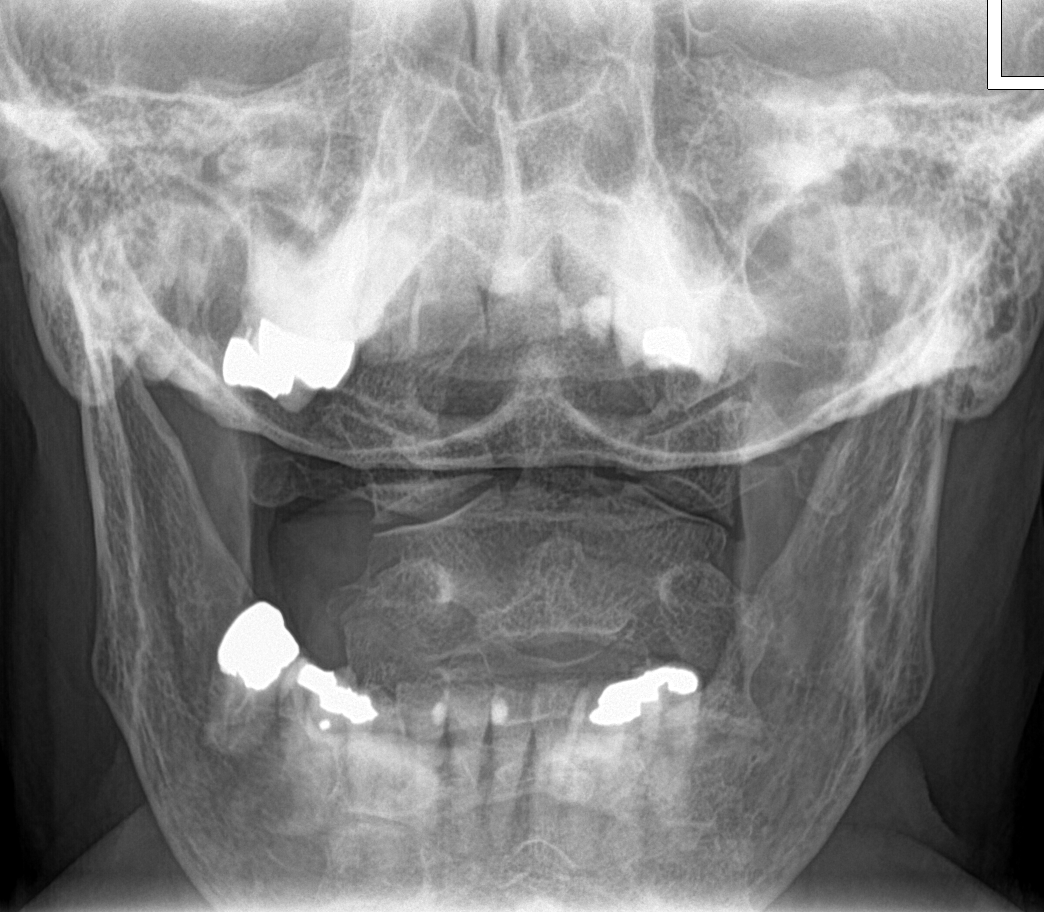

[5 of 5 positions shown; findings below may reference images not displayed]

FINDINGS: Normal alignment. No listhesis. Mild disc space narrowing at C4-C5,
C5-C6, and C6-C7. There is mild multilevel facet hypertrophy. The
lateral masses of C1 are well aligned on C2. No evidence of fracture
or focal bone abnormality. Limited assessment of right bony foramina
due to positioning. There is C5-C6 bony in neural foraminal
narrowing on the left. No prevertebral soft tissue thickening.
IMPRESSION: 1. Degenerative disc disease at C4-C5, C5-C6, and C6-C7. Mild
multilevel facet hypertrophy.
2. Left C5-C6 bony neural foraminal narrowing.

## 2022-08-31 NOTE — Telephone Encounter (Signed)
Requested Prescriptions  Pending Prescriptions Disp Refills   simvastatin (ZOCOR) 40 MG tablet [Pharmacy Med Name: SIMVASTATIN 40 MG TABLET] 90 tablet 1    Sig: TAKE 1 TABLET BY MOUTH EVERYDAY AT BEDTIME     Cardiovascular:  Antilipid - Statins Failed - 08/30/2022  8:23 AM      Failed - Lipid Panel in normal range within the last 12 months    Cholesterol, Total  Date Value Ref Range Status  12/08/2021 155 100 - 199 mg/dL Final   LDL Chol Calc (NIH)  Date Value Ref Range Status  12/08/2021 87 0 - 99 mg/dL Final   HDL  Date Value Ref Range Status  12/08/2021 35 (L) >39 mg/dL Final   Triglycerides  Date Value Ref Range Status  12/08/2021 192 (H) 0 - 149 mg/dL Final         Passed - Patient is not pregnant      Passed - Valid encounter within last 12 months    Recent Outpatient Visits           3 weeks ago Acute cystitis without hematuria   [place] Mikey Kirschner, PA-C   2 months ago Primary hypertension   [place], Donald E, MD   3 months ago Non-recurrent acute suppurative otitis media of right ear without spontaneous rupture of tympanic membrane   [place] Mikey Kirschner, PA-C   5 months ago Acute cough   [facility] Birdie Sons, MD   5 months ago Viral URI   [place] Myles Gip, DO       Future Appointments             In 3 months Fisher, Kirstie Peri, MD [facility]

## 2022-09-02 ENCOUNTER — Other Ambulatory Visit: Payer: Self-pay | Admitting: Surgery

## 2022-09-03 DIAGNOSIS — D2272 Melanocytic nevi of left lower limb, including hip: Secondary | ICD-10-CM | POA: Diagnosis not present

## 2022-09-03 DIAGNOSIS — D225 Melanocytic nevi of trunk: Secondary | ICD-10-CM | POA: Diagnosis not present

## 2022-09-03 DIAGNOSIS — D2261 Melanocytic nevi of right upper limb, including shoulder: Secondary | ICD-10-CM | POA: Diagnosis not present

## 2022-09-03 DIAGNOSIS — D2271 Melanocytic nevi of right lower limb, including hip: Secondary | ICD-10-CM | POA: Diagnosis not present

## 2022-09-03 DIAGNOSIS — D2262 Melanocytic nevi of left upper limb, including shoulder: Secondary | ICD-10-CM | POA: Diagnosis not present

## 2022-09-03 DIAGNOSIS — L821 Other seborrheic keratosis: Secondary | ICD-10-CM | POA: Diagnosis not present

## 2022-09-04 ENCOUNTER — Encounter
Admission: RE | Admit: 2022-09-04 | Discharge: 2022-09-04 | Disposition: A | Discharge status: Home / Self Care | Payer: Medicare Other | Source: Ambulatory Visit | Attending: Surgery | Admitting: Surgery

## 2022-09-04 ENCOUNTER — Other Ambulatory Visit: Payer: Self-pay

## 2022-09-04 VITALS — BP 153/78 | HR 67 | Resp 14 | Ht 61.0 in | Wt 141.0 lb

## 2022-09-04 DIAGNOSIS — Z01818 Encounter for other preprocedural examination: Secondary | ICD-10-CM | POA: Diagnosis not present

## 2022-09-04 DIAGNOSIS — I10 Essential (primary) hypertension: Secondary | ICD-10-CM | POA: Insufficient documentation

## 2022-09-04 DIAGNOSIS — Z01810 Encounter for preprocedural cardiovascular examination: Secondary | ICD-10-CM | POA: Diagnosis not present

## 2022-09-04 HISTORY — DX: Gastro-esophageal reflux disease without esophagitis: K21.9

## 2022-09-04 NOTE — Patient Instructions (Addendum)
Your procedure is scheduled on: 09/15/22 Report to [place]. To find out your arrival time please call [phone] between 1PM - 3PM on 09/23/22.  Remember: Instructions that are not followed completely may result in serious medical risk, up to and including death, or upon the discretion of your surgeon and anesthesiologist your surgery may need to be rescheduled.     _X__ 1. Do not eat food after midnight the night before your procedure.                 No gum chewing or hard candies. You may drink clear liquids up to 2 hours                 before you are scheduled to arrive for your surgery- DO not drink clear                 liquids within 2 hours of the start of your surgery.                 Clear Liquids include:  water, apple juice without pulp, clear carbohydrate                 drink such as Clearfast or Gatorade, Black Coffee or Tea (Do not add                 anything to coffee or tea). Diabetics water only  Drink the Ensure 2 hours before arriving for surgery.  __X__2.  On the morning of surgery brush your teeth with toothpaste and water, you                 may rinse your mouth with mouthwash if you wish.  Do not swallow any              toothpaste of mouthwash.     _X__ 3.  No Alcohol for 24 hours before or after surgery.   _X__ 4.  Do Not Smoke or use e-cigarettes For 24 Hours Prior to Your Surgery.                 Do not use any chewable tobacco products for at least 6 hours prior to                 surgery.  ____  5.  Bring all medications with you on the day of surgery if instructed.   __X__  6.  Notify your doctor if there is any change in your medical condition      (cold, fever, infections).     Do not wear jewelry, make-up, hairpins, clips or nail polish. Do not wear lotions, powders, or perfumes. You can use deodorant. Do not shave body hair 48 hours prior to surgery. Men may shave face and neck. Do not  bring valuables to the hospital.    [facility] is not responsible for any belongings or valuables.  Contacts, dentures/partials or body piercings may not be worn into surgery. Bring a case for your contacts, glasses or hearing aids, a denture cup will be supplied. Leave your suitcase in the car. After surgery it may be brought to your room. For patients admitted to the hospital, discharge time is determined by your treatment team. In case of increased patient census, it may be necessary for you, the patient, to continue your postoperative care within the Same Day Surgery department.   Patients discharged the day of surgery  will need  driver.    Please read over the following fact sheets that you were given:   MRSA Information, CHG soap, Ensure, Incentive Spirometer  __X__ Take these medicines the morning of surgery with A SIP OF WATER:    1. omeprazole (PRILOSEC) 40 MG capsule   2.   3.   4.  5.  6.  ____ Fleet Enema (as directed)   __X__ Use CHG Soap/SAGE wipes as directed  ____ Use inhalers on the day of surgery  ____ Stop metformin/Janumet/Farxiga 2 days prior to surgery    ____ Take 1/2 of usual insulin dose the night before surgery. No insulin the morning          of surgery.   ____ Stop Blood Thinners Coumadin/Plavix/Xarelto/Pleta/Pradaxa/Eliquis/Effient/Aspirin  on   Or contact your Surgeon, Cardiologist or Medical Doctor regarding  ability to stop your blood thinners  __X__ Stop Anti-inflammatories 7 days before surgery such as Advil, Ibuprofen, Motrin,  BC or Goodies Powder, Naprosyn, Naproxen, Aleve, Aspirin Stop the Meloxicam 7 days before surgery. You can substitute Tylenol as needed.   __X__ Stop all herbals and supplements, fish oil or vitamins 7 days before surgery.    ____ Bring C-Pap to the hospital.        Preparing for Surgery with CHLORHEXIDINE GLUCONATE (CHG) Soap  Chlorhexidine Gluconate (CHG) Soap  o An antiseptic cleaner that kills germs and  bonds with the skin to continue killing germs even after washing  o Used for showering the night before surgery and morning of surgery  Before surgery, you can play an important role by reducing the number of germs on your skin.  CHG (Chlorhexidine gluconate) soap is an antiseptic cleanser which kills germs and bonds with the skin to continue killing germs even after washing.  Please do not use if you have an allergy to CHG or antibacterial soaps. If your skin becomes reddened/irritated stop using the CHG.  1. Shower the NIGHT BEFORE SURGERY and the MORNING OF SURGERY with CHG soap.  2. If you choose to wash your hair, wash your hair first as usual with your normal shampoo.  3. After shampooing, rinse your hair and body thoroughly to remove the shampoo.  4. Use CHG as you would any other liquid soap. You can apply CHG directly to the skin and wash gently with a scrungie or a clean washcloth.  5. Apply the CHG soap to your body only from the neck down. Do not use on open wounds or open sores. Avoid contact with your eyes, ears, mouth, and genitals (private parts). Wash face and genitals (private parts) with your normal soap.  6. Wash thoroughly, paying special attention to the area where your surgery will be performed.  7. Thoroughly rinse your body with warm water.  8. Do not shower/wash with your normal soap after using and rinsing off the CHG soap.  9. Pat yourself dry with a clean towel.  10. Wear clean pajamas to bed the night before surgery.  12. Place clean sheets on your bed the night of your first shower and do not sleep with pets.  13. Shower again with the CHG soap on the day of surgery prior to arriving at the hospital.  14. Do not apply any deodorants/lotions/powders.  15. Please wear clean clothes to the hospital.

## 2022-09-14 MED ORDER — LACTATED RINGERS IV SOLN: INTRAVENOUS | Status: DC

## 2022-09-14 MED ORDER — CHLORHEXIDINE GLUCONATE 0.12 % MT SOLN: 15.0000 mL | Freq: Once | OROMUCOSAL | Status: DC

## 2022-09-14 MED ORDER — ORAL CARE MOUTH RINSE: 15.0000 mL | Freq: Once | OROMUCOSAL | Status: DC

## 2022-09-15 ENCOUNTER — Encounter
Admission: RE | Disposition: A | Discharge status: Home / Self Care | Payer: Self-pay | Source: Home / Self Care | Attending: Surgery

## 2022-09-15 ENCOUNTER — Ambulatory Visit: Payer: Medicare Other | Admitting: Anesthesiology

## 2022-09-15 ENCOUNTER — Other Ambulatory Visit: Payer: Self-pay

## 2022-09-15 ENCOUNTER — Ambulatory Visit
Admission: RE | Admit: 2022-09-15 | Discharge: 2022-09-15 | Disposition: A | Discharge status: Home / Self Care | Payer: Medicare Other | Attending: Surgery | Admitting: Surgery

## 2022-09-15 ENCOUNTER — Encounter: Payer: Self-pay | Admitting: Surgery

## 2022-09-15 DIAGNOSIS — E785 Hyperlipidemia, unspecified: Secondary | ICD-10-CM | POA: Diagnosis not present

## 2022-09-15 DIAGNOSIS — M720 Palmar fascial fibromatosis [Dupuytren]: Secondary | ICD-10-CM | POA: Insufficient documentation

## 2022-09-15 DIAGNOSIS — I10 Essential (primary) hypertension: Secondary | ICD-10-CM | POA: Diagnosis not present

## 2022-09-15 DIAGNOSIS — K219 Gastro-esophageal reflux disease without esophagitis: Secondary | ICD-10-CM | POA: Diagnosis not present

## 2022-09-15 DIAGNOSIS — J309 Allergic rhinitis, unspecified: Secondary | ICD-10-CM

## 2022-09-15 HISTORY — PX: DUPUYTREN CONTRACTURE RELEASE: SHX1478

## 2022-09-15 SURGERY — RELEASE, DUPUYTREN CONTRACTURE
Anesthesia: General | Site: Ring Finger | Laterality: Right

## 2022-09-15 MED ORDER — ACETAMINOPHEN 10 MG/ML IV SOLN
INTRAVENOUS | Status: DC | PRN
  Administered 2022-09-15: 1000 mg via INTRAVENOUS

## 2022-09-15 MED ORDER — KETOROLAC TROMETHAMINE 15 MG/ML IJ SOLN: 15.0000 mg | Freq: Once | INTRAMUSCULAR | Status: AC

## 2022-09-15 MED ORDER — MIDAZOLAM HCL 2 MG/2ML IJ SOLN
INTRAMUSCULAR | Status: DC | PRN
  Administered 2022-09-15: 1 mg via INTRAVENOUS

## 2022-09-15 MED ORDER — OXYCODONE HCL 5 MG PO TABS: 5.0000 mg | ORAL_TABLET | ORAL | Status: DC | PRN

## 2022-09-15 MED ORDER — CEFAZOLIN SODIUM-DEXTROSE 2-4 GM/100ML-% IV SOLN
2.0000 g | Freq: Once | INTRAVENOUS | Status: AC
  Administered 2022-09-15: 2 g via INTRAVENOUS

## 2022-09-15 MED ORDER — LIDOCAINE HCL (PF) 2 % IJ SOLN
INTRAMUSCULAR | Status: AC
  Filled 2022-09-15: qty 5

## 2022-09-15 MED ORDER — ACETAMINOPHEN 10 MG/ML IV SOLN
INTRAVENOUS | Status: AC
  Filled 2022-09-15: qty 100

## 2022-09-15 MED ORDER — CHLORHEXIDINE GLUCONATE 0.12 % MT SOLN
OROMUCOSAL | Status: AC
  Filled 2022-09-15: qty 15

## 2022-09-15 MED ORDER — LIDOCAINE HCL (CARDIAC) PF 100 MG/5ML IV SOSY
PREFILLED_SYRINGE | INTRAVENOUS | Status: DC | PRN
  Administered 2022-09-15: 60 mg via INTRAVENOUS

## 2022-09-15 MED ORDER — BUPIVACAINE HCL (PF) 0.5 % IJ SOLN
INTRAMUSCULAR | Status: AC
  Filled 2022-09-15: qty 30

## 2022-09-15 MED ORDER — FENTANYL CITRATE (PF) 100 MCG/2ML IJ SOLN: 25.0000 ug | INTRAMUSCULAR | Status: DC | PRN

## 2022-09-15 MED ORDER — OXYCODONE HCL 5 MG/5ML PO SOLN: 5.0000 mg | Freq: Once | ORAL | Status: DC | PRN

## 2022-09-15 MED ORDER — KETAMINE HCL 50 MG/5ML IJ SOSY
PREFILLED_SYRINGE | INTRAMUSCULAR | Status: AC
  Filled 2022-09-15: qty 5

## 2022-09-15 MED ORDER — CLINDAMYCIN PHOSPHATE 900 MG/50ML IV SOLN: 900.0000 mg | INTRAVENOUS | Status: DC

## 2022-09-15 MED ORDER — FENTANYL CITRATE (PF) 100 MCG/2ML IJ SOLN
INTRAMUSCULAR | Status: AC
  Filled 2022-09-15: qty 2

## 2022-09-15 MED ORDER — HYDROCODONE-ACETAMINOPHEN 5-325 MG PO TABS: 1.0000 | ORAL_TABLET | Freq: Four times a day (QID) | ORAL | 0 refills | Status: DC | PRN

## 2022-09-15 MED ORDER — DEXAMETHASONE SODIUM PHOSPHATE 10 MG/ML IJ SOLN
INTRAMUSCULAR | Status: AC
  Filled 2022-09-15: qty 1

## 2022-09-15 MED ORDER — KETAMINE HCL 10 MG/ML IJ SOLN
INTRAMUSCULAR | Status: DC | PRN
  Administered 2022-09-15: 30 mg via INTRAVENOUS
  Administered 2022-09-15: 20 mg via INTRAVENOUS

## 2022-09-15 MED ORDER — CLINDAMYCIN PHOSPHATE 900 MG/50ML IV SOLN
INTRAVENOUS | Status: AC
  Filled 2022-09-15: qty 50

## 2022-09-15 MED ORDER — 0.9 % SODIUM CHLORIDE (POUR BTL) OPTIME
TOPICAL | Status: DC | PRN
  Administered 2022-09-15: 500 mL

## 2022-09-15 MED ORDER — PROPOFOL 10 MG/ML IV BOLUS
INTRAVENOUS | Status: AC
  Filled 2022-09-15: qty 20

## 2022-09-15 MED ORDER — KETOROLAC TROMETHAMINE 15 MG/ML IJ SOLN
INTRAMUSCULAR | Status: AC
  Administered 2022-09-15: 15 mg via INTRAVENOUS
  Filled 2022-09-15: qty 1

## 2022-09-15 MED ORDER — FENTANYL CITRATE (PF) 100 MCG/2ML IJ SOLN
INTRAMUSCULAR | Status: DC | PRN
  Administered 2022-09-15 (×4): 25 ug via INTRAVENOUS

## 2022-09-15 MED ORDER — ONDANSETRON HCL 4 MG/2ML IJ SOLN: 4.0000 mg | Freq: Once | INTRAMUSCULAR | Status: DC | PRN

## 2022-09-15 MED ORDER — OXYCODONE HCL 5 MG PO TABS: 5.0000 mg | ORAL_TABLET | Freq: Once | ORAL | Status: DC | PRN

## 2022-09-15 MED ORDER — PHENYLEPHRINE HCL-NACL 20-0.9 MG/250ML-% IV SOLN
INTRAVENOUS | Status: DC | PRN
  Administered 2022-09-15: 50 ug/min via INTRAVENOUS

## 2022-09-15 MED ORDER — ONDANSETRON HCL 4 MG/2ML IJ SOLN
INTRAMUSCULAR | Status: AC
  Filled 2022-09-15: qty 2

## 2022-09-15 MED ORDER — ACETAMINOPHEN 10 MG/ML IV SOLN: 1000.0000 mg | Freq: Once | INTRAVENOUS | Status: DC | PRN

## 2022-09-15 MED ORDER — MIDAZOLAM HCL 2 MG/2ML IJ SOLN
INTRAMUSCULAR | Status: AC
  Filled 2022-09-15: qty 2

## 2022-09-15 MED ORDER — ONDANSETRON HCL 4 MG/2ML IJ SOLN
INTRAMUSCULAR | Status: DC | PRN
  Administered 2022-09-15: 4 mg via INTRAVENOUS

## 2022-09-15 MED ORDER — CEFAZOLIN SODIUM-DEXTROSE 2-4 GM/100ML-% IV SOLN
INTRAVENOUS | Status: AC
  Filled 2022-09-15: qty 100

## 2022-09-15 MED ORDER — FLUTICASONE PROPIONATE 50 MCG/ACT NA SUSP: 2.0000 | Freq: Every day | NASAL | Status: DC | PRN

## 2022-09-15 MED ORDER — DEXAMETHASONE SODIUM PHOSPHATE 10 MG/ML IJ SOLN
INTRAMUSCULAR | Status: DC | PRN
  Administered 2022-09-15: 5 mg via INTRAVENOUS

## 2022-09-15 MED ORDER — MONTELUKAST SODIUM 10 MG PO TABS: 10.0000 mg | ORAL_TABLET | Freq: Every day | ORAL | Status: DC

## 2022-09-15 MED ORDER — OXYCODONE HCL 5 MG PO TABS
ORAL_TABLET | ORAL | Status: AC
  Administered 2022-09-15: 10 mg via ORAL
  Filled 2022-09-15: qty 2

## 2022-09-15 MED ORDER — CEFAZOLIN SODIUM-DEXTROSE 2-4 GM/100ML-% IV SOLN: 2.0000 g | INTRAVENOUS | Status: DC

## 2022-09-15 MED ORDER — BUPIVACAINE HCL (PF) 0.5 % IJ SOLN
INTRAMUSCULAR | Status: DC | PRN
  Administered 2022-09-15: 10 mL

## 2022-09-15 MED ORDER — PROPOFOL 10 MG/ML IV BOLUS
INTRAVENOUS | Status: DC | PRN
  Administered 2022-09-15: 140 mg via INTRAVENOUS

## 2022-09-15 SURGICAL SUPPLY — 41 items
APL PRP STRL LF DISP 70% ISPRP (MISCELLANEOUS) ×2
BNDG CMPR 5X4 CHSV STRCH STRL (GAUZE/BANDAGES/DRESSINGS) ×1
BNDG CMPR 75X21 PLY HI ABS (MISCELLANEOUS) ×1
BNDG COHESIVE 4X5 TAN STRL LF (GAUZE/BANDAGES/DRESSINGS) ×1 IMPLANT
BNDG ELASTIC 2X5.8 VLCR STR LF (GAUZE/BANDAGES/DRESSINGS) ×1 IMPLANT
BNDG ELASTIC 3X5.8 VLCR STR LF (GAUZE/BANDAGES/DRESSINGS) ×1 IMPLANT
BNDG ESMARCH 4 X 12 STRL LF (GAUZE/BANDAGES/DRESSINGS) ×1
BNDG ESMARCH 4X12 STRL LF (GAUZE/BANDAGES/DRESSINGS) ×1 IMPLANT
CHLORAPREP W/TINT 26 (MISCELLANEOUS) ×1 IMPLANT
CORD BIP STRL DISP 12FT (MISCELLANEOUS) ×1 IMPLANT
CUFF TOURN SGL QUICK 18X4 (TOURNIQUET CUFF) ×1 IMPLANT
DRAPE SURG 17X11 SM STRL (DRAPES) ×1 IMPLANT
ELECT REM PT RETURN 9FT ADLT (ELECTROSURGICAL) ×1
ELECTRODE REM PT RTRN 9FT ADLT (ELECTROSURGICAL) ×1 IMPLANT
FORCEPS JEWEL BIP 4-3/4 STR (INSTRUMENTS) ×1 IMPLANT
GAUZE SPONGE 4X4 12PLY STRL (GAUZE/BANDAGES/DRESSINGS) ×1 IMPLANT
GAUZE STRETCH 2X75IN STRL (MISCELLANEOUS) ×1 IMPLANT
GAUZE XEROFORM 1X8 LF (GAUZE/BANDAGES/DRESSINGS) ×1 IMPLANT
GLOVE BIO SURGEON STRL SZ8 (GLOVE) ×2 IMPLANT
GLOVE SURG UNDER LTX SZ8 (GLOVE) ×1 IMPLANT
GOWN STRL REUS W/ TWL LRG LVL3 (GOWN DISPOSABLE) ×1 IMPLANT
GOWN STRL REUS W/ TWL XL LVL3 (GOWN DISPOSABLE) ×1 IMPLANT
GOWN STRL REUS W/TWL LRG LVL3 (GOWN DISPOSABLE) ×1
GOWN STRL REUS W/TWL XL LVL3 (GOWN DISPOSABLE) ×1
KIT TURNOVER KIT A (KITS) ×1 IMPLANT
MANIFOLD NEPTUNE II (INSTRUMENTS) ×1 IMPLANT
NDL HYPO 25GX1X1/2 BEV (NEEDLE) IMPLANT
NEEDLE HYPO 25GX1X1/2 BEV (NEEDLE) ×1 IMPLANT
NS IRRIG 1000ML POUR BTL (IV SOLUTION) ×1 IMPLANT
NS IRRIG 500ML POUR BTL (IV SOLUTION) ×1 IMPLANT
PACK EXTREMITY ARMC (MISCELLANEOUS) ×1 IMPLANT
PAD PREP 24X41 OB/GYN DISP (PERSONAL CARE ITEMS) ×1 IMPLANT
SPLINT CAST 1 STEP 3X12 (MISCELLANEOUS) IMPLANT
SPONGE GAUZE 2X2 8PLY STRL LF (GAUZE/BANDAGES/DRESSINGS) ×1 IMPLANT
STOCKINETTE IMPERVIOUS 9X36 MD (GAUZE/BANDAGES/DRESSINGS) ×1 IMPLANT
SUT PROLENE 4 0 PS 2 18 (SUTURE) ×1 IMPLANT
SUT VIC AB 3-0 SH 27 (SUTURE) ×1
SUT VIC AB 3-0 SH 27X BRD (SUTURE) ×1 IMPLANT
SYR 10ML LL (SYRINGE) IMPLANT
TRAP FLUID SMOKE EVACUATOR (MISCELLANEOUS) ×1 IMPLANT
WATER STERILE IRR 500ML POUR (IV SOLUTION) ×1 IMPLANT

## 2022-09-15 NOTE — Anesthesia Preprocedure Evaluation (Signed)
Anesthesia Evaluation  Patient identified by MRN, date of birth, ID band Patient awake    Reviewed: Allergy & Precautions, NPO status , Patient's Chart, lab work & pertinent test results  History of Anesthesia Complications Negative for: history of anesthetic complications  Airway Mallampati: II  TM Distance: >3 FB Neck ROM: Full    Dental no notable dental hx. (+) Teeth Intact   Pulmonary neg pulmonary ROS, neg sleep apnea, neg COPD, Patient abstained from smoking.Not current smoker   Pulmonary exam normal breath sounds clear to auscultation       Cardiovascular Exercise Tolerance: Good METShypertension, (-) CAD and (-) Past MI (-) dysrhythmias  Rhythm:Regular Rate:Normal - Systolic murmurs    Neuro/Psych negative neurological ROS  negative psych ROS   GI/Hepatic ,GERD  Medicated and Controlled,,(+)     (-) substance abuse    Endo/Other  neg diabetes    Renal/GU negative Renal ROS     Musculoskeletal   Abdominal   Peds  Hematology   Anesthesia Other Findings Past Medical History: No date: Allergic rhinitis No date: Allergy No date: Arthritis     Comment:  lower back No date: GERD (gastroesophageal reflux disease) No date: Hyperlipidemia No date: Hypertension  Reproductive/Obstetrics                             Anesthesia Physical Anesthesia Plan  ASA: 2  Anesthesia Plan: General   Post-op Pain Management: Ofirmev IV (intra-op)*   Induction: Intravenous  PONV Risk Score and Plan: 3 and Ondansetron and Dexamethasone  Airway Management Planned: LMA  Additional Equipment: None  Intra-op Plan:   Post-operative Plan: Extubation in OR  Informed Consent: I have reviewed the patients History and Physical, chart, labs and discussed the procedure including the risks, benefits and alternatives for the proposed anesthesia with the patient or authorized representative who has  indicated his/her understanding and acceptance.     Dental advisory given  Plan Discussed with: CRNA and Surgeon  Anesthesia Plan Comments: (Discussed risks of anesthesia with patient, including PONV, sore throat, lip/dental/eye damage. Rare risks discussed as well, such as cardiorespiratory and neurological sequelae, and allergic reactions. Discussed the role of CRNA in patient's perioperative care. Patient understands.  Patient has listed allergy to PCN - full body rash, "swelling", went to hospital but did not get admitted. Severe blistering skin reaction (SJS/TEN)? no Liver or kidney injury caused by PCN? no Hemolytic anemia from PCN? no Drug fever? no Painful swollen joints? no Severe reaction involving inside of mouth, eye, or genital ulcers? no Based on current evidence Alfonse Alpers et al, J Allergy Clin Immunol Pract, 2019), will proceed with cefazolin use: Yes  )       Anesthesia Quick Evaluation

## 2022-09-15 NOTE — Transfer of Care (Signed)
Immediate Anesthesia Transfer of Care Note  Patient: Deanna Weaver  Procedure(s) Performed: 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. (Right: Ring Finger)  Patient Location: PACU  Anesthesia Type:General  Level of Consciousness: awake and alert   Airway & Oxygen Therapy: Patient Spontanous Breathing and Patient connected to face mask oxygen  Post-op Assessment: Report given to RN and Post -op Vital signs reviewed and stable  Post vital signs: Reviewed and stable  Last Vitals:  Vitals Value Taken Time  BP 148/75 09/15/22 1225  Temp    Pulse 92 09/15/22 1228  Resp 15 09/15/22 1228  SpO2 100 % 09/15/22 1228  Vitals shown include unvalidated device data.  Last Pain:  Vitals:   09/15/22 0954  TempSrc: Oral  PainSc: 0-No pain         Complications: No notable events documented.

## 2022-09-15 NOTE — Anesthesia Postprocedure Evaluation (Signed)
Anesthesia Post Note  Patient: Deanna Weaver  Procedure(s) Performed: 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. (Right: Ring Finger)  Patient location during evaluation: PACU Anesthesia Type: General Level of consciousness: awake and alert Pain management: pain level controlled Vital Signs Assessment: post-procedure vital signs reviewed and stable Respiratory status: spontaneous breathing, nonlabored ventilation, respiratory function stable and patient connected to nasal cannula oxygen Cardiovascular status: blood pressure returned to baseline and stable Postop Assessment: no apparent nausea or vomiting Anesthetic complications: no   No notable events documented.   Last Vitals:  Vitals:   09/15/22 0954 09/15/22 1225  BP: (!) 161/70 (!) 148/75  Pulse: 66 90  Resp: 16 12  Temp: 36.5 C 36.5 C  SpO2: 100% 99%    Last Pain:  Vitals:   09/15/22 1230  TempSrc:   PainSc: 0-No pain                 Arita Miss

## 2022-09-15 NOTE — Anesthesia Procedure Notes (Signed)
Procedure Name: Intubation Date/Time: 09/15/2022 10:55 AM  Performed by: Fredderick Phenix, CRNAPre-anesthesia Checklist: Patient identified, Emergency Drugs available, Suction available and Patient being monitored Patient Re-evaluated:Patient Re-evaluated prior to induction Oxygen Delivery Method: Circle system utilized Preoxygenation: Pre-oxygenation with 100% oxygen Induction Type: IV induction Ventilation: Mask ventilation without difficulty LMA: LMA inserted LMA Size: 4.0 Tube type: Oral Number of attempts: 1 Airway Equipment and Method: Oral airway Placement Confirmation: positive ETCO2 and breath sounds checked- equal and bilateral Tube secured with: Tape Dental Injury: Teeth and Oropharynx as per pre-operative assessment

## 2022-09-15 NOTE — Op Note (Signed)
09/15/2022  12:34 PM  Patient:   Deanna Weaver  Pre-Op Diagnosis:   Dupuytren's contracture, right ring finger.  Post-Op Diagnosis:   Same.  Procedure:   Release of Dupuytren's contracture, right ring finger.  Surgeon:   Pascal Lux, MD  Assistant:   None  Anesthesia:   General LMA  Findings:   As above.  Complications:   None  EBL:   1 cc  Fluids:   800 cc crystalloid  TT:   61 minutes at 250 mmHg  Drains:   None  Closure:   4-0 Prolene interrupted sutures  Brief Clinical Note:   The patient is a 77 year old female with a history of a gradually worsening contracture of her right ring finger. The symptoms have progressed despite medications, activity modification, etc. The patient's history and examination are consistent with a Dupuytren's contracture of the right ring finger. The patient presents at this time for release of the Dupuytren's contracture of the right ring finger.  Procedure:   The patient underwent placement of a supraclavicular block in the preoperative holding area by the anesthesiologist before being brought into the operating room and lain in the supine position. After adequate general laryngeal mask anesthesia was achieved, the right hand and upper extremity were prepped with ChloraPrep solution before being draped sterilely. Preoperative antibiotics were administered. A timeout was performed to verify the appropriate surgical site before the limb was exsanguinated with an Esmarch and the tourniquet inflated to 250 mmHg.    A Brunner type zigzag incision was made along the volar aspect of the ring finger beginning just proximal to the proximal palmar crease and extending to the PIP flexion crease. The incision was carried down through subcutaneous tissues. The fibrous cord was identified and carefully dissected out from proximal to distal after releasing it proximally. As dissection was carried out, care was taken to identify and protect the common digital  nerve and artery on either side of the cord, as well as the underlying flexor tendon, proximally. More distally, the digital neurovascular bundles were identified and protected. After the mass was removed in its entirety, the adequacy of excision was verified by palpation as well as visually. After excision of the Dupuytren's tissue, the right ring finger MCP and PIP joints could be extended fully.  The wound was copiously irrigated with sterile saline solution before the skin was reapproximated using 4-0 Prolene interrupted sutures. A total of 10 cc of 0.25% plain Sensorcaine was injected in and around the incision to help with postoperative analgesia before a sterile bulky dressing and volar splint extending to the fingertips was applied, maintaining the MCP joints in extension. The patient was then awakened and returned to the recovery room in satisfactory condition after tolerating the procedure well.

## 2022-09-15 NOTE — H&P (Signed)
History of Present Illness:  Deanna Weaver is a 77 y.o. female who presents for evaluation and treatment and intermittent painful thickened cord in the palmar aspect of her right hand. She notes that these symptoms have been present for 3 to 4 years and primarily affect her ring finger. She describes an itching sensation to her palm on today's visit, but denies any true pain in the finger. However, this area will become more moderately painful at times, reaching as high as a 5/10 score. She has been taking Tylenol as necessary with temporary partial relief of her symptoms. She denies any numbness or paresthesias to her fingers. She also denies any prior injury to her hand. The patient is right-hand dominant.  Current Outpatient Medications: calcium carbonate 500 mg calcium (1,250 mg) tablet Take by mouth once daily  dextromethorphan-guaifenesin (MUCINEX DM) 30-600 mg ER tablet Take 1 tablet by mouth every 12 (twelve) hours.  estradioL (ESTRACE) 0.01 % (0.1 mg/gram) vaginal cream Place vaginally once as needed  fluticasone (FLONASE) 50 mcg/actuation nasal spray by Nasal route.  latanoprost (XALATAN) 0.005 % ophthalmic solution Apply 1 drop to eye at bedtime  lisinopril (PRINIVIL,ZESTRIL) 5 MG tablet TAKE 1 TABLET BY MOUTH EVERY DAY  montelukast (SINGULAIR) 10 mg tablet Take by mouth once as needed  moxifloxacin (AVELOX) 400 mg tablet Take 400 mg by mouth once as needed  omega-3 fatty acids/fish oil 340-1,000 mg capsule Take 1 capsule by mouth 2 (two) times daily.  omeprazole (PRILOSEC) 40 MG DR capsule Take by mouth once daily  simvastatin (ZOCOR) 40 MG tablet Take 40 mg by mouth nightly.   Allergies:  Celecoxib Swelling  Metoprolol Rash  Depression, palpitations.  Calcium Carb-Vit D2-Mg-Bioflav Nausea  Cefdinir Rash  Penicillins Rash    Past Medical History: No pertinent past medical history.  Past Surgical History: No pertinent surgical history.   Family History:  No Known Problems  Mother  No Known Problems Father   Social History:   Socioeconomic History:  Marital status: Married  Tobacco Use  Smoking status: Never  Smokeless tobacco: Never  Vaping Use  Vaping Use: Never used  Substance and Sexual Activity  Alcohol use: No  Alcohol/week: 0.0 standard drinks of alcohol  Drug use: No  Sexual activity: Defer   Review of Systems:  A comprehensive 14 point ROS was performed, reviewed, and the pertinent orthopaedic findings are documented in the HPI.  Physical Exam: Vitals:  08/24/22 1141  BP: (!) 146/94  Weight: 64.8 kg (142 lb 12.8 oz)  Height: 154.9 cm (5' 1"$ )  PainSc: 0-No pain  PainLoc: Hand   General/Constitutional: The patient appears to be well-nourished, well-developed, and in no acute distress. Neuro/Psych: Normal mood and affect, oriented to person, place and time. Eyes: Non-icteric. Pupils are equal, round, and reactive to light, and exhibit synchronous movement. ENT: Unremarkable. Lymphatic: No palpable adenopathy. Respiratory: Lungs clear to auscultation, Normal chest excursion, No wheezes, and Non-labored breathing Cardiovascular: Regular rate and rhythm. No murmurs. and No edema, swelling or tenderness, except as noted in detailed exam. Integumentary: No impressive skin lesions present, except as noted in detailed exam. Musculoskeletal: Unremarkable, except as noted in detailed exam.  Right hand exam: Skin inspection of the right hand is notable for a thickened cord extending from the proximal palmar crease distally to the PIP flexion crease along the volar aspect of the right ring finger. This cord is quite firm but nontender to palpation. Her MCP joint lacks approximately 25 degrees of being able to achieve  full extension but she is able to flex all digits fully without any pain or triggering. The PIP joint lacks approximately 5 degrees of full extension. She is neurovascularly intact to all digits.  Assessment: 1. Dupuytren's  contracture, right ring finger.  Plan: The treatment options were discussed with the patient and her daughter. In addition, patient educational materials were provided regarding the diagnosis and treatment options. The patient is frustrated by her continued symptoms and functional limitations, and is ready to consider more aggressive treatment options. Therefore, I have recommended a surgical procedure, specifically an excision of the Dupuytren's contracture involving the right ring finger. The procedure was discussed with the patient, as were the potential risks (including bleeding, infection, nerve and/or blood vessel injury, persistent or recurrent pain, recurrence of the cord, stiffness of the finger, weakness of grip, need for further surgery, blood clots, strokes, heart attacks and/or arhythmias, pneumonia, etc.) and benefits. The patient states her understanding and wishes to proceed. All of the patient's questions and concerns were answered. She can call any time with further concerns. She will follow up post-surgery, routine.    H&P reviewed and patient re-examined. No changes.

## 2022-09-15 NOTE — Discharge Instructions (Addendum)
AMBULATORY SURGERY  DISCHARGE INSTRUCTIONS   The drugs that you were given will stay in your system until tomorrow so for the next 24 hours you should not:  Drive an automobile Make any legal decisions Drink any alcoholic beverage   You may resume regular meals tomorrow.  Today it is better to start with liquids and gradually work up to solid foods.  You may eat anything you prefer, but it is better to start with liquids, then soup and crackers, and gradually work up to solid foods.   Please notify your doctor immediately if you have any unusual bleeding, trouble breathing, redness and pain at the surgery site, drainage, fever, or pain not relieved by medication.    Your post-operative visit with Dr.                                       is: Date:                        Time:    Please call to schedule your post-operative visit.  Additional Instructions:Orthopedic discharge instructions: Keep splint dry and intact. Keep hand elevated above heart level. Apply ice to affected area frequently. Take ibuprofen 600-800 mg TID OR meloxicam 7.5 mg BID with meals for 3-5 days, then as necessary. Take pain medication as prescribed or ES Tylenol when needed.  Return for follow-up in 10-14 days or as scheduled.

## 2022-09-16 ENCOUNTER — Encounter: Payer: Self-pay | Admitting: Surgery

## 2022-09-16 LAB — SURGICAL PATHOLOGY

## 2022-10-09 ENCOUNTER — Ambulatory Visit: Payer: Medicare Other | Attending: Student | Admitting: Occupational Therapy

## 2022-10-09 DIAGNOSIS — L905 Scar conditions and fibrosis of skin: Secondary | ICD-10-CM | POA: Insufficient documentation

## 2022-10-09 DIAGNOSIS — M6281 Muscle weakness (generalized): Secondary | ICD-10-CM | POA: Insufficient documentation

## 2022-10-09 DIAGNOSIS — M25641 Stiffness of right hand, not elsewhere classified: Secondary | ICD-10-CM | POA: Diagnosis not present

## 2022-10-09 DIAGNOSIS — M79641 Pain in right hand: Secondary | ICD-10-CM | POA: Diagnosis not present

## 2022-10-10 NOTE — Therapy (Signed)
[facility] [address] [place], [address] Phone: [phone]   Fax:  [phone]  Occupational Therapy Evaluation  Patient Details  Name: Deanna Weaver MRN: [id number] Date of Birth: 05/17/1946 Referring Provider (OT): Poggi,   Encounter Date: 10/09/2022   OT End of Session - 10/10/22 2234     Visit Number 1    Number of Visits 12    Date for OT Re-Evaluation 11/20/22    OT Start Time 1000    OT Stop Time 1046    OT Time Calculation (min) 46 min    Activity Tolerance Patient tolerated treatment well    Behavior During Therapy [facility] for tasks assessed/performed             Past Medical History:  Diagnosis Date   Allergic rhinitis    Allergy    Arthritis    lower back   GERD (gastroesophageal reflux disease)    Hyperlipidemia    Hypertension     Past Surgical History:  Procedure Laterality Date   ABDOMINAL HYSTERECTOMY     CATARACT EXTRACTION W/ INTRAOCULAR LENS IMPLANT Right    CATARACT EXTRACTION W/PHACO Right 01/16/2019   Procedure: CATARACT EXTRACTION PHACO AND INTRAOCULAR LENS PLACEMENT ([place]) RIGHT;  Surgeon: Eulogio Bear, MD;  Location: [place];  Service: Ophthalmology;  Laterality: Right;   CATARACT EXTRACTION W/PHACO Left 03/27/2019   Procedure: CATARACT EXTRACTION PHACO AND INTRAOCULAR LENS PLACEMENT ([place]) LEFT;  Surgeon: Eulogio Bear, MD;  Location: [place];  Service: Ophthalmology;  Laterality: Left;   COLONOSCOPY     COLONOSCOPY WITH PROPOFOL N/A 04/24/2019   Procedure: COLONOSCOPY WITH PROPOFOL;  Surgeon: Lucilla Lame, MD;  Location: [place];  Service: Endoscopy;  Laterality: N/A;   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   Procedure: 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.;  Surgeon: Corky Mull, MD;  Location: [facility];  Service: Orthopedics;  Laterality: Right;   FLEXIBLE SIGMOIDOSCOPY N/A 10/09/2019   Procedure:  FLEXIBLE SIGMOIDOSCOPY WITH BIOPSY;  Surgeon: Lucilla Lame, MD;  Location: [place];  Service: Endoscopy;  Laterality: N/A;   FOOT SURGERY     POLYPECTOMY  04/24/2019   Procedure: POLYPECTOMY;  Surgeon: Lucilla Lame, MD;  Location: [place];  Service: Endoscopy;;   POLYPECTOMY N/A 10/09/2019   Procedure: POLYPECTOMY;  Surgeon: Lucilla Lame, MD;  Location: [place];  Service: Endoscopy;  Laterality: N/A;   VAGINAL HYSTERECTOMY  1990    There were no vitals filed for this visit.                                     Patient will benefit from skilled therapeutic intervention in order to improve the following deficits and impairments:           Visit Diagnosis: Muscle weakness (generalized)  Pain in right hand  Stiffness of right hand, not elsewhere classified    Problem List Patient Active Problem List   Diagnosis Date Noted   History of adenomatous polyp of colon    Special screening for malignant neoplasms, colon    Polyp of ascending colon    Polyp of sigmoid colon    Rectal polyp    Dupuytren's contracture of hand 11/04/2017   Allergic rhinitis 04/02/2015   Glossalgia 04/02/2015   Hypertension 04/02/2015   Tremor 04/02/2015   Varicose veins 04/02/2015  LBP (low back pain) 06/11/2006   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 [place], [address] Phone: [phone]   Fax:  [phone]  Name: Deanna Weaver MRN: [id number] Date of Birth: 02-May-1946

## 2022-10-15 NOTE — Addendum Note (Signed)
Addended by: Garlon Hatchet T on: 10/15/2022 09:36 PM   Modules accepted: Orders

## 2022-10-16 ENCOUNTER — Ambulatory Visit: Payer: Medicare Other | Admitting: Occupational Therapy

## 2022-10-16 DIAGNOSIS — M6281 Muscle weakness (generalized): Secondary | ICD-10-CM | POA: Diagnosis not present

## 2022-10-16 DIAGNOSIS — M25641 Stiffness of right hand, not elsewhere classified: Secondary | ICD-10-CM

## 2022-10-16 DIAGNOSIS — L905 Scar conditions and fibrosis of skin: Secondary | ICD-10-CM | POA: Diagnosis not present

## 2022-10-16 DIAGNOSIS — M79641 Pain in right hand: Secondary | ICD-10-CM | POA: Diagnosis not present

## 2022-10-21 ENCOUNTER — Encounter: Payer: Self-pay | Admitting: Occupational Therapy

## 2022-10-21 NOTE — Therapy (Signed)
[facility] [address], [place], [address] Phone: [phone]   Fax:  [phone]  Occupational Therapy Treatment  Patient Details  Name: Deanna Weaver MRN: [id number] Date of Birth: 1946/07/05 Referring Provider (OT): Earnest Rosier   Encounter Date: 10/16/2022   OT End of Session - 10/21/22 1914     Visit Number 2    Number of Visits 12    Date for OT Re-Evaluation 11/20/22    OT Start Time 0910    OT Stop Time 0954    OT Time Calculation (min) 44 min    Activity Tolerance Patient tolerated treatment well    Behavior During Therapy [facility] for tasks assessed/performed             Past Medical History:  Diagnosis Date   Allergic rhinitis    Allergy    Arthritis    lower back   GERD (gastroesophageal reflux disease)    Hyperlipidemia    Hypertension     Past Surgical History:  Procedure Laterality Date   ABDOMINAL HYSTERECTOMY     CATARACT EXTRACTION W/ INTRAOCULAR LENS IMPLANT Right    CATARACT EXTRACTION W/PHACO Right 01/16/2019   Procedure: CATARACT EXTRACTION PHACO AND INTRAOCULAR LENS PLACEMENT ([place]) RIGHT;  Surgeon: Eulogio Bear, MD;  Location: [place];  Service: Ophthalmology;  Laterality: Right;   CATARACT EXTRACTION W/PHACO Left 03/27/2019   Procedure: CATARACT EXTRACTION PHACO AND INTRAOCULAR LENS PLACEMENT () LEFT;  Surgeon: Eulogio Bear, MD;  Location: [place];  Service: Ophthalmology;  Laterality: Left;   COLONOSCOPY     COLONOSCOPY WITH PROPOFOL N/A 04/24/2019   Procedure: COLONOSCOPY WITH PROPOFOL;  Surgeon: Lucilla Lame, MD;  Location: [place];  Service: Endoscopy;  Laterality: N/A;   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   Procedure: 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.;  Surgeon: Corky Mull, MD;  Location: [facility];  Service: Orthopedics;  Laterality: Right;   FLEXIBLE SIGMOIDOSCOPY N/A 10/09/2019    Procedure: FLEXIBLE SIGMOIDOSCOPY WITH BIOPSY;  Surgeon: Lucilla Lame, MD;  Location: [place];  Service: Endoscopy;  Laterality: N/A;   FOOT SURGERY     POLYPECTOMY  04/24/2019   Procedure: POLYPECTOMY;  Surgeon: Lucilla Lame, MD;  Location: [place];  Service: Endoscopy;;   POLYPECTOMY N/A 10/09/2019   Procedure: POLYPECTOMY;  Surgeon: Lucilla Lame, MD;  Location: [place];  Service: Endoscopy;  Laterality: N/A;   VAGINAL HYSTERECTOMY  1990    There were no vitals filed for this visit.   Subjective Assessment - 10/21/22 1912     Subjective  Pt reports she is doing well, reports some soreness, not necessarily pain.  Some tenderness 2/10    Pertinent History Deanna Weaver is a 77 y.o. female who presents for evaluation and treatment and intermittent painful thickened cord in the palmar aspect of her right hand. She notes that these symptoms have been present for 3 to 4 years and primarily affect her ring finger. She describes an itching sensation to her palm on today's visit, but denies any true pain in the finger. However, this area will become more moderately painful at times, reaching as high as a 5/10 score. She has been taking Tylenol as necessary with temporary partial relief of her symptoms. She denies any numbness or paresthesias to her fingers. She also denies any prior injury to her hand. The patient is right-hand dominant. Pt with a history of a gradually  worsening contracture of her right ring finger. The symptoms have progressed despite medications, activity modification, etc. The patient's history and examination are consistent with a Dupuytren's contracture of the right ring finger. Pt underwent surgical procedure for release of DuPuytren's contracture of the right ring on 09-15-2022    Patient Stated Goals Pt reports she wants to be able to be as independent as possible, use hand like she always has in the past.    Currently in Pain? Yes    Pain Score 2      Pain Location Hand    Pain Orientation Right    Pain Descriptors / Indicators Aching;Numbness    Pain Type Surgical pain    Pain Onset More than a month ago            Scar much improved this date, all scabbing is gone and everything is closed.  Pt reports she has been using scar cream and massaging into tissues the last few days.    Fluidotherapy:  Pt seen for Fluidotherapy this date for 10 mins to decrease pain, decrease inflammation, increase ROM and tissue mobility.  Pt instructed on performing range of motion exercises while in fluido.    Manual Therapy: Following fluidotherapy, pt was seen for scar massage to right hand with instruction for home program , use of mini massager to more thickened scar areas with good toleration.    Therapeutic Exercises: Pt seen for tendon gliding exercises to right hand, thumb opposition to all digits.  Use of red foam for finger extension with rolling which also worked towards massaging scar.  Issued red foam to use at home as a part of her home program.   Issued Cica care for scar management to hydrate, soften and heal scar.  To use at night with sleeve over top     OT Education - 10/21/22 1914     Education Details contrast for edema control, HEP    Person(s) Educated Patient    Methods Explanation;Handout    Comprehension Verbalized understanding;Returned demonstration;Need further instruction              OT Short Term Goals - 10/15/22 2109       OT SHORT TERM GOAL #1   Title Pt will demonstrate knowledge of the use of contrast for edema control    Baseline Eval: no current knowledge    Time 3    Period Weeks    Status New    Target Date 10/29/22      OT SHORT TERM GOAL #2   Title Pt to demonstrate understanding and daily implementation of scar massage to decrease risk of adhesions and promote tissue healing.    Baseline Eval: no knowledge of scar massage    Time 3    Period Weeks    Status New    Target Date  11/20/22               OT Long Term Goals - 10/15/22 2110       OT LONG TERM GOAL #1   Title Pt will demonstrate home exercise program with modified independence.    Baseline Eval: no current program.    Time 6    Period Weeks    Status New    Target Date 11/20/22      OT LONG TERM GOAL #2   Title Pt will improve FOTO score to 64 or greater to demonstrate a clinically relevant change to facilitate greater independence in daily tasks.  Baseline Eval FOTO score 43    Time 6    Period Weeks    Status New    Target Date 11/20/22      OT LONG TERM GOAL #3   Title Pt to demonstrate improved strength and right hand function to peel potatoes with modified independence.    Baseline Eval:  Unable to peel potatoes.    Time 6    Period Weeks    Status New    Target Date 11/20/22      OT LONG TERM GOAL #4   Title Pt will demonstrate ability to perform meal preparation with modified independence.    Baseline Eval: unable to complete more complex meal prep at eval, difficulty lifting pots/pans.    Time 6    Period Weeks    Status New    Target Date 11/20/22                   Plan - 10/21/22 1915     Clinical Impression Statement Pt is a 77 yo female diagnosed with a right Dupuytren's contracture, she underwent surgical procedure for dupuytren's contracture release of the right ring finger on 09/15/2022, Dr. Roland Rack.    Pt had her sutures removed on 09/28/2022 and was referred to OP Occupational Therapy for evaluation and treatment.  Pt evaluated and presents with post op scar of the right hand, decreased ROM, muscle weakness and decreased functional use of right dominant hand. Pt's scar much improved this date from last session, all areas are closed, no scabbing present and pt has been applying scar cream and starting to massage area this week.  Instructed in scar massage and use of CICA care for scar management as a part of her home program.  ROM improved with exercises and  finger extension this date.  Pt would benefit from skilled Occupational Therapy to maximize safety and independence in necessary daily tasks.    OT Occupational Profile and History Detailed Assessment- Review of Records and additional review of physical, cognitive, psychosocial history related to current functional performance    Occupational performance deficits (Please refer to evaluation for details): ADL's;IADL's;Leisure    Body Structure / Function / Physical Skills ADL;Dexterity;Flexibility;ROM;Strength;Coordination;Edema;FMC;IADL;Scar mobility;Wound;Pain;Sensation;UE functional use;Fascial restriction;GMC;Skin integrity    Rehab Potential Excellent    Clinical Decision Making Several treatment options, min-mod task modification necessary    Comorbidities Affecting Occupational Performance: May have comorbidities impacting occupational performance    Modification or Assistance to Complete Evaluation  No modification of tasks or assist necessary to complete eval    OT Frequency 2x / week    OT Duration 6 weeks    OT Treatment/Interventions Self-care/ADL training;Cryotherapy;Paraffin;Therapeutic exercise;DME and/or AE instruction;Ultrasound;Fluidtherapy;Neuromuscular education;Manual Therapy;Scar mobilization;Splinting;Moist Heat;Contrast Bath;Passive range of motion;Therapeutic activities;Patient/family education    Consulted and Agree with Plan of Care Patient             Patient will benefit from skilled therapeutic intervention in order to improve the following deficits and impairments:   Body Structure / Function / Physical Skills: ADL, Dexterity, Flexibility, ROM, Strength, Coordination, Edema, FMC, IADL, Scar mobility, Wound, Pain, Sensation, UE functional use, Fascial restriction, GMC, Skin integrity       Visit Diagnosis: Muscle weakness (generalized)  Pain in right hand  Stiffness of right hand, not elsewhere classified    Problem List Patient Active Problem List    Diagnosis Date Noted   History of adenomatous polyp of colon    Special screening for malignant neoplasms, colon  Polyp of ascending colon    Polyp of sigmoid colon    Rectal polyp    Dupuytren's contracture of hand 11/04/2017   Allergic rhinitis 04/02/2015   Glossalgia 04/02/2015   Hypertension 04/02/2015   Tremor 04/02/2015   Varicose veins 04/02/2015   LBP (low back pain) 06/11/2006   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 [place], [address] Phone: [phone]   Fax:  [phone]  Name: Deanna Weaver MRN: [id number] Date of Birth: July 13, 1946

## 2022-10-23 ENCOUNTER — Ambulatory Visit: Payer: Medicare Other | Admitting: Occupational Therapy

## 2022-10-23 DIAGNOSIS — M25641 Stiffness of right hand, not elsewhere classified: Secondary | ICD-10-CM

## 2022-10-23 DIAGNOSIS — M6281 Muscle weakness (generalized): Secondary | ICD-10-CM | POA: Diagnosis not present

## 2022-10-23 DIAGNOSIS — L905 Scar conditions and fibrosis of skin: Secondary | ICD-10-CM | POA: Diagnosis not present

## 2022-10-23 DIAGNOSIS — M79641 Pain in right hand: Secondary | ICD-10-CM

## 2022-10-23 NOTE — Therapy (Signed)
[facility] [address] [place], [address] Phone: [phone]   Fax:  [phone]  Occupational Therapy Treatment  Patient Details  Name: Deanna Weaver MRN: [id number] Date of Birth: 01-14-1946 Referring Provider (OT): Earnest Rosier   Encounter Date: 10/23/2022   OT End of Session - 10/23/22 1041     Visit Number 3    Number of Visits 12    Date for OT Re-Evaluation 11/20/22    OT Start Time 0920    OT Stop Time 0945    OT Time Calculation (min) 25 min    Activity Tolerance Patient tolerated treatment well    Behavior During Therapy [facility] for tasks assessed/performed             Past Medical History:  Diagnosis Date   Allergic rhinitis    Allergy    Arthritis    lower back   GERD (gastroesophageal reflux disease)    Hyperlipidemia    Hypertension     Past Surgical History:  Procedure Laterality Date   ABDOMINAL HYSTERECTOMY     CATARACT EXTRACTION W/ INTRAOCULAR LENS IMPLANT Right    CATARACT EXTRACTION W/PHACO Right 01/16/2019   Procedure: CATARACT EXTRACTION PHACO AND INTRAOCULAR LENS PLACEMENT ([place]) RIGHT;  Surgeon: Eulogio Bear, MD;  Location: [place];  Service: Ophthalmology;  Laterality: Right;   CATARACT EXTRACTION W/PHACO Left 03/27/2019   Procedure: CATARACT EXTRACTION PHACO AND INTRAOCULAR LENS PLACEMENT ([place]) LEFT;  Surgeon: Eulogio Bear, MD;  Location: [place];  Service: Ophthalmology;  Laterality: Left;   COLONOSCOPY     COLONOSCOPY WITH PROPOFOL N/A 04/24/2019   Procedure: COLONOSCOPY WITH PROPOFOL;  Surgeon: Lucilla Lame, MD;  Location: [place];  Service: Endoscopy;  Laterality: N/A;   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   Procedure: 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.;  Surgeon: Corky Mull, MD;  Location: [facility];  Service: Orthopedics;  Laterality: Right;   FLEXIBLE SIGMOIDOSCOPY N/A 10/09/2019    Procedure: FLEXIBLE SIGMOIDOSCOPY WITH BIOPSY;  Surgeon: Lucilla Lame, MD;  Location: [place];  Service: Endoscopy;  Laterality: N/A;   FOOT SURGERY     POLYPECTOMY  04/24/2019   Procedure: POLYPECTOMY;  Surgeon: Lucilla Lame, MD;  Location: [place];  Service: Endoscopy;;   POLYPECTOMY N/A 10/09/2019   Procedure: POLYPECTOMY;  Surgeon: Lucilla Lame, MD;  Location: [place];  Service: Endoscopy;  Laterality: N/A;   VAGINAL HYSTERECTOMY  1990    There were no vitals filed for this visit.   Subjective Assessment - 10/23/22 1039     Subjective  Doing okay - I can make fist -and scar feeling good    Pertinent History Deanna Weaver is a 77 y.o. female who presents for evaluation and treatment and intermittent painful thickened cord in the palmar aspect of her right hand. She notes that these symptoms have been present for 3 to 4 years and primarily affect her ring finger. She describes an itching sensation to her palm on today's visit, but denies any true pain in the finger. However, this area will become more moderately painful at times, reaching as high as a 5/10 score. She has been taking Tylenol as necessary with temporary partial relief of her symptoms. She denies any numbness or paresthesias to her fingers. She also denies any prior injury to her hand. The patient is right-hand dominant. Pt with a history of a gradually worsening contracture of her right  ring finger. The symptoms have progressed despite medications, activity modification, etc. The patient's history and examination are consistent with a Dupuytren's contracture of the right ring finger. Pt underwent surgical procedure for release of DuPuytren's contracture of the right ring on 09-15-2022    Patient Stated Goals Pt reports she wants to be able to be as independent as possible, use hand like she always has in the past.    Currently in Pain? No/denies                [facility] OT Assessment - 10/23/22  0001       Right Hand AROM   R Index  MCP 0-90 90 Degrees    R Index PIP 0-100 100 Degrees    R Long  MCP 0-90 90 Degrees    R Long PIP 0-100 100 Degrees    R Ring  MCP 0-90 90 Degrees   -10 on MC   R Ring PIP 0-100 100 Degrees    R Little  MCP 0-90 90 Degrees    R Little PIP 0-100 95 Degrees                      OT Treatments/Exercises (OP) - 10/23/22 0001       RUE Paraffin   Number Minutes Paraffin 8 Minutes    RUE Paraffin Location Hand    Comments prior to scar massage and ROM            Scar cont to  improve greatly - review and done scar massage with pt with and without cica scar pad and mini massager - focus on [facility] and proximal fold at [facility] - pt to cont with cica scar pad and silicon sleeve on 4th - to tolerance     Paraffin - done this date to  to decrease stiffness and scar tissue and increase ROM and tissue mobility- followed by scar massage and ROM       Therapeutic Exercises: Pt seen for tendon gliding exercises to right hand, thumb opposition WNL  Can cont with  red foam for finger extension with rolling which also worked towards massaging scar.  Issued red foam to use at home as a part of her home program.  Table slides added for composite extention for digits and wrist 20 reps pain free              OT Education - 10/23/22 1041     Education Details progress and changes to HEP    Person(s) Educated Patient    Methods Explanation;Handout    Comprehension Verbalized understanding;Returned demonstration;Need further instruction              OT Short Term Goals - 10/15/22 2109       OT SHORT TERM GOAL #1   Title Pt will demonstrate knowledge of the use of contrast for edema control    Baseline Eval: no current knowledge    Time 3    Period Weeks    Status New    Target Date 10/29/22      OT SHORT TERM GOAL #2   Title Pt to demonstrate understanding and daily implementation of scar massage to decrease risk of adhesions and  promote tissue healing.    Baseline Eval: no knowledge of scar massage    Time 3    Period Weeks    Status New    Target Date 11/20/22  OT Long Term Goals - 10/15/22 2110       OT LONG TERM GOAL #1   Title Pt will demonstrate home exercise program with modified independence.    Baseline Eval: no current program.    Time 6    Period Weeks    Status New    Target Date 11/20/22      OT LONG TERM GOAL #2   Title Pt will improve FOTO score to 64 or greater to demonstrate a clinically relevant change to facilitate greater independence in daily tasks.    Baseline Eval FOTO score 43    Time 6    Period Weeks    Status New    Target Date 11/20/22      OT LONG TERM GOAL #3   Title Pt to demonstrate improved strength and right hand function to peel potatoes with modified independence.    Baseline Eval:  Unable to peel potatoes.    Time 6    Period Weeks    Status New    Target Date 11/20/22      OT LONG TERM GOAL #4   Title Pt will demonstrate ability to perform meal preparation with modified independence.    Baseline Eval: unable to complete more complex meal prep at eval, difficulty lifting pots/pans.    Time 6    Period Weeks    Status New    Target Date 11/20/22                   Plan - 10/23/22 1042     Clinical Impression Statement Pt is a 77 yo female diagnosed with a right Dupuytren's contracture, she underwent surgical procedure for dupuytren's contracture release of the right ring finger on 09/15/2022, Dr. Roland Rack.    Pt had her sutures removed on 09/28/2022 and was referred to OP Occupational Therapy for evaluation and treatment.  Pt evaluated and presents with post op scar of the right hand, decreased ROM, muscle weakness and decreased functional use of right dominant hand. Pt coming in for 2nd follow up with [facility] in [place] for flexion and extention - scar tissue improving greatly - reinforce and review with pt scar massage - as well as  to focus on extention of digits.  Instructed in scar massage again this date and cont to use  CICA care for scar management at night time and silicon sleeve on 4th.   Pt would benefit from skilled Occupational Therapy to decrease scar tissue and  increase ROM, strenght to return to prior level of function.    OT Occupational Profile and History Detailed Assessment- Review of Records and additional review of physical, cognitive, psychosocial history related to current functional performance    Occupational performance deficits (Please refer to evaluation for details): ADL's;IADL's;Leisure    Body Structure / Function / Physical Skills ADL;Dexterity;Flexibility;ROM;Strength;Coordination;Edema;FMC;IADL;Scar mobility;Wound;Pain;Sensation;UE functional use;Fascial restriction;GMC;Skin integrity    Rehab Potential Excellent    Clinical Decision Making Several treatment options, min-mod task modification necessary    Comorbidities Affecting Occupational Performance: May have comorbidities impacting occupational performance    Modification or Assistance to Complete Evaluation  No modification of tasks or assist necessary to complete eval    OT Frequency 1x / week    OT Duration 6 weeks    OT Treatment/Interventions Self-care/ADL training;Cryotherapy;Paraffin;Therapeutic exercise;DME and/or AE instruction;Ultrasound;Fluidtherapy;Neuromuscular education;Manual Therapy;Scar mobilization;Splinting;Moist Heat;Contrast Bath;Passive range of motion;Therapeutic activities;Patient/family education    Consulted and Agree with Plan of Care Patient  Patient will benefit from skilled therapeutic intervention in order to improve the following deficits and impairments:   Body Structure / Function / Physical Skills: ADL, Dexterity, Flexibility, ROM, Strength, Coordination, Edema, FMC, IADL, Scar mobility, Wound, Pain, Sensation, UE functional use, Fascial restriction, GMC, Skin integrity       Visit  Diagnosis: Muscle weakness (generalized)  Pain in right hand  Stiffness of right hand, not elsewhere classified  Scar condition and fibrosis of skin    Problem List Patient Active Problem List   Diagnosis Date Noted   History of adenomatous polyp of colon    Special screening for malignant neoplasms, colon    Polyp of ascending colon    Polyp of sigmoid colon    Rectal polyp    Dupuytren's contracture of hand 11/04/2017   Allergic rhinitis 04/02/2015   Glossalgia 04/02/2015   Hypertension 04/02/2015   Tremor 04/02/2015   Varicose veins 04/02/2015   LBP (low back pain) 06/11/2006   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, [place], [address] Phone: [phone]   Fax:  [phone]  Name: KAREN SARRATT MRN: [id number] Date of Birth: [birth year]

## 2022-10-29 ENCOUNTER — Ambulatory Visit: Payer: Medicare Other | Admitting: Occupational Therapy

## 2022-10-29 ENCOUNTER — Encounter: Payer: Self-pay | Admitting: Occupational Therapy

## 2022-10-29 DIAGNOSIS — L905 Scar conditions and fibrosis of skin: Secondary | ICD-10-CM

## 2022-10-29 DIAGNOSIS — M79641 Pain in right hand: Secondary | ICD-10-CM | POA: Diagnosis not present

## 2022-10-29 DIAGNOSIS — M25641 Stiffness of right hand, not elsewhere classified: Secondary | ICD-10-CM

## 2022-10-29 DIAGNOSIS — M6281 Muscle weakness (generalized): Secondary | ICD-10-CM | POA: Diagnosis not present

## 2022-11-01 NOTE — Therapy (Signed)
[facility] [address] [place], [address] Phone: [phone]   Fax:  [phone]  Occupational Therapy Treatment  Patient Details  Name: Deanna Weaver MRN: [id number] Date of Birth: Jan 13, 1946 Referring Provider (OT): Earnest Rosier   Encounter Date: 10/29/2022   OT End of Session - 11/01/22 1858     Visit Number 4    Number of Visits 12    Date for OT Re-Evaluation 11/20/22    OT Start Time 0908    OT Stop Time 0950    OT Time Calculation (min) 42 min    Activity Tolerance Patient tolerated treatment well    Behavior During Therapy [facility] for tasks assessed/performed             Past Medical History:  Diagnosis Date   Allergic rhinitis    Allergy    Arthritis    lower back   GERD (gastroesophageal reflux disease)    Hyperlipidemia    Hypertension     Past Surgical History:  Procedure Laterality Date   ABDOMINAL HYSTERECTOMY     CATARACT EXTRACTION W/ INTRAOCULAR LENS IMPLANT Right    CATARACT EXTRACTION W/PHACO Right 01/16/2019   Procedure: CATARACT EXTRACTION PHACO AND INTRAOCULAR LENS PLACEMENT ([place]) RIGHT;  Surgeon: Eulogio Bear, MD;  Location: [place];  Service: Ophthalmology;  Laterality: Right;   CATARACT EXTRACTION W/PHACO Left 03/27/2019   Procedure: CATARACT EXTRACTION PHACO AND INTRAOCULAR LENS PLACEMENT ([place]) LEFT;  Surgeon: Eulogio Bear, MD;  Location: [place];  Service: Ophthalmology;  Laterality: Left;   COLONOSCOPY     COLONOSCOPY WITH PROPOFOL N/A 04/24/2019   Procedure: COLONOSCOPY WITH PROPOFOL;  Surgeon: Lucilla Lame, MD;  Location: [place];  Service: Endoscopy;  Laterality: N/A;   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   Procedure: 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.;  Surgeon: Corky Mull, MD;  Location: [facility];  Service: Orthopedics;  Laterality: Right;   FLEXIBLE SIGMOIDOSCOPY N/A 10/09/2019    Procedure: FLEXIBLE SIGMOIDOSCOPY WITH BIOPSY;  Surgeon: Lucilla Lame, MD;  Location: [place];  Service: Endoscopy;  Laterality: N/A;   FOOT SURGERY     POLYPECTOMY  04/24/2019   Procedure: POLYPECTOMY;  Surgeon: Lucilla Lame, MD;  Location: [place];  Service: Endoscopy;;   POLYPECTOMY N/A 10/09/2019   Procedure: POLYPECTOMY;  Surgeon: Lucilla Lame, MD;  Location: [place];  Service: Endoscopy;  Laterality: N/A;   VAGINAL HYSTERECTOMY  1990    There were no vitals filed for this visit.   Subjective Assessment - 11/01/22 1857     Subjective  Pt reports she is doing well and pleased with her progress.    Pertinent History Deanna Weaver is a 77 y.o. female who presents for evaluation and treatment and intermittent painful thickened cord in the palmar aspect of her right hand. She notes that these symptoms have been present for 3 to 4 years and primarily affect her ring finger. She describes an itching sensation to her palm on today's visit, but denies any true pain in the finger. However, this area will become more moderately painful at times, reaching as high as a 5/10 score. She has been taking Tylenol as necessary with temporary partial relief of her symptoms. She denies any numbness or paresthesias to her fingers. She also denies any prior injury to her hand. The patient is right-hand dominant. Pt with a history of a gradually worsening contracture of her right  ring finger. The symptoms have progressed despite medications, activity modification, etc. The patient's history and examination are consistent with a Dupuytren's contracture of the right ring finger. Pt underwent surgical procedure for release of DuPuytren's contracture of the right ring on 09-15-2022    Patient Stated Goals Pt reports she wants to be able to be as independent as possible, use hand like she always has in the past.    Currently in Pain? No/denies    Pain Score 0-No pain                Paraffin - performed this date for 10 mins to decrease stiffness and scar tissue and increase ROM and tissue mobility- followed by scar massage and ROM    Manual Therapy:  Following paraffin, pt seen for manual therapy for focus on Scar massage and management  with continued  improvement.  Review of and performed scar massage with pt with and without cica scar pad and mini massager - focus on [facility] and proximal fold at [facility] - pt to cont with cica scar pad and silicon sleeve on 4th - to tolerance      Therapeutic Exercises: Following manual therapy, Pt seen for tendon gliding exercises to right hand, thumb opposition WNL  Can cont with  red foam for finger extension with rolling which also worked towards massaging scar.   Table slides performed for composite extension for digits and wrist 20 reps pain free with cues for proper hand positioning and form.  Measurements taken for ROM, see flow sheet.  Full opposition of thumb to each digit.    [facility] OT Assessment - 11/01/22 1910       Assessment   Referring Provider (OT) Poggi, McGhee    Hand Dominance Right    Next MD Visit 10/26/2022      Balance Screen   Has the patient fallen in the past 6 months No      Home  Environment   Family/patient expects to be discharged to: Private residence    Lives With Spouse      Prior Function   Level of [place] Retired    Leisure keeps to [employer] grandkids      ADL   ADL comments Requires assistance with cutting meat, preparing vegetables, meals, husband helps with cooking right now and lifting pots and pans, Independent with basic self care.  Has daughters who help with homemaking tasks as well currently.      Observation/Other Assessments   Focus on Therapeutic Outcomes (FOTO)  43      Sensation   Light Touch Appears Intact    Additional Comments Pt reports some numbness right index finger at DIP tip      AROM   Right Wrist Extension 45 Degrees    Right  Wrist Flexion 70 Degrees      Right Hand AROM   R Index  MCP 0-90 90 Degrees    R Index PIP 0-100 100 Degrees    R Long  MCP 0-90 90 Degrees    R Long PIP 0-100 100 Degrees    R Ring  MCP 0-90 90 Degrees    R Ring PIP 0-100 100 Degrees    R Little  MCP 0-90 90 Degrees    R Little PIP 0-100 95 Degrees              OT Education - 11/01/22 1858     Education Details progress and changes to  HEP    Person(s) Educated Patient    Methods Explanation;Handout    Comprehension Verbalized understanding;Returned demonstration;Need further instruction              OT Short Term Goals - 10/15/22 2109       OT SHORT TERM GOAL #1   Title Pt will demonstrate knowledge of the use of contrast for edema control    Baseline Eval: no current knowledge    Time 3    Period Weeks    Status New    Target Date 10/29/22      OT SHORT TERM GOAL #2   Title Pt to demonstrate understanding and daily implementation of scar massage to decrease risk of adhesions and promote tissue healing.    Baseline Eval: no knowledge of scar massage    Time 3    Period Weeks    Status New    Target Date 11/20/22               OT Long Term Goals - 10/15/22 2110       OT LONG TERM GOAL #1   Title Pt will demonstrate home exercise program with modified independence.    Baseline Eval: no current program.    Time 6    Period Weeks    Status New    Target Date 11/20/22      OT LONG TERM GOAL #2   Title Pt will improve FOTO score to 64 or greater to demonstrate a clinically relevant change to facilitate greater independence in daily tasks.    Baseline Eval FOTO score 43    Time 6    Period Weeks    Status New    Target Date 11/20/22      OT LONG TERM GOAL #3   Title Pt to demonstrate improved strength and right hand function to peel potatoes with modified independence.    Baseline Eval:  Unable to peel potatoes.    Time 6    Period Weeks    Status New    Target Date 11/20/22      OT LONG  TERM GOAL #4   Title Pt will demonstrate ability to perform meal preparation with modified independence.    Baseline Eval: unable to complete more complex meal prep at eval, difficulty lifting pots/pans.    Time 6    Period Weeks    Status New    Target Date 11/20/22                   Plan - 11/01/22 1859     Clinical Impression Statement Pt is a 77 yo female diagnosed with a right Dupuytren's contracture, she underwent surgical procedure for dupuytren's contracture release of the right ring finger on 09/15/2022, Dr. Roland Rack.    Pt had her sutures removed on 09/28/2022 and was referred to OP Occupational Therapy for evaluation and treatment.  Pt evaluated and presents with post op scar of the right hand, decreased ROM, muscle weakness and decreased functional use of right dominant hand. Pt coming in for follow up with continued great progress in AROM for flexion and extension of digits and wrist, see flow sheet for details.- scar tissue improving greatly - reinforce and review with pt scar massage - as well as to focus on extension of digits. Added table slides this week to improve wrist motion. CICA care for scar management at night time and silicon sleeve on 4th.   Pt would benefit from skilled Occupational Therapy  to decrease scar tissue and  increase ROM, strenght to return to prior level of function.    OT Occupational Profile and History Detailed Assessment- Review of Records and additional review of physical, cognitive, psychosocial history related to current functional performance    Occupational performance deficits (Please refer to evaluation for details): ADL's;IADL's;Leisure    Body Structure / Function / Physical Skills ADL;Dexterity;Flexibility;ROM;Strength;Coordination;Edema;FMC;IADL;Scar mobility;Wound;Pain;Sensation;UE functional use;Fascial restriction;GMC;Skin integrity    Rehab Potential Excellent    Clinical Decision Making Several treatment options, min-mod task  modification necessary    Comorbidities Affecting Occupational Performance: May have comorbidities impacting occupational performance    Modification or Assistance to Complete Evaluation  No modification of tasks or assist necessary to complete eval    OT Frequency 1x / week    OT Duration 6 weeks    OT Treatment/Interventions Self-care/ADL training;Cryotherapy;Paraffin;Therapeutic exercise;DME and/or AE instruction;Ultrasound;Fluidtherapy;Neuromuscular education;Manual Therapy;Scar mobilization;Splinting;Moist Heat;Contrast Bath;Passive range of motion;Therapeutic activities;Patient/family education    Consulted and Agree with Plan of Care Patient             Patient will benefit from skilled therapeutic intervention in order to improve the following deficits and impairments:   Body Structure / Function / Physical Skills: ADL, Dexterity, Flexibility, ROM, Strength, Coordination, Edema, FMC, IADL, Scar mobility, Wound, Pain, Sensation, UE functional use, Fascial restriction, GMC, Skin integrity       Visit Diagnosis: Muscle weakness (generalized)  Pain in right hand  Stiffness of right hand, not elsewhere classified  Scar condition and fibrosis of skin    Problem List Patient Active Problem List   Diagnosis Date Noted   History of adenomatous polyp of colon    Special screening for malignant neoplasms, colon    Polyp of ascending colon    Polyp of sigmoid colon    Rectal polyp    Dupuytren's contracture of hand 11/04/2017   Allergic rhinitis 04/02/2015   Glossalgia 04/02/2015   Hypertension 04/02/2015   Tremor 04/02/2015   Varicose veins 04/02/2015   LBP (low back pain) 06/11/2006   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, [place], [address] Phone: [phone]   Fax:  [phone]  Name: Deanna Weaver MRN: [id number] Date of  Birth: Feb 14, 1946

## 2022-11-06 ENCOUNTER — Ambulatory Visit: Payer: Medicare Other | Attending: Student | Admitting: Occupational Therapy

## 2022-11-06 DIAGNOSIS — L905 Scar conditions and fibrosis of skin: Secondary | ICD-10-CM | POA: Diagnosis not present

## 2022-11-06 DIAGNOSIS — M79641 Pain in right hand: Secondary | ICD-10-CM

## 2022-11-06 DIAGNOSIS — M25641 Stiffness of right hand, not elsewhere classified: Secondary | ICD-10-CM | POA: Diagnosis not present

## 2022-11-06 DIAGNOSIS — M6281 Muscle weakness (generalized): Secondary | ICD-10-CM

## 2022-11-06 NOTE — Therapy (Signed)
[facility] [address] [place], [place], [address] Phone: [phone]   Fax:  [phone]  Occupational Therapy Treatment  Patient Details  Name: Deanna BoerRuby H Iannuzzi MRN: [id number] Date of Birth: May 31, 1946 Referring Provider (OT): Jamal MaesPoggi, McGhee   Encounter Date: 11/06/2022   OT End of Session - 11/06/22 1000     Visit Number 5    Number of Visits 12    Date for OT Re-Evaluation 11/20/22    OT Start Time 0926    OT Stop Time 0955    OT Time Calculation (min) 29 min    Activity Tolerance Patient tolerated treatment well    Behavior During Therapy [facility] for tasks assessed/performed             Past Medical History:  Diagnosis Date   Allergic rhinitis    Allergy    Arthritis    lower back   GERD (gastroesophageal reflux disease)    Hyperlipidemia    Hypertension     Past Surgical History:  Procedure Laterality Date   ABDOMINAL HYSTERECTOMY     CATARACT EXTRACTION W/ INTRAOCULAR LENS IMPLANT Right    CATARACT EXTRACTION W/PHACO Right 01/16/2019   Procedure: CATARACT EXTRACTION PHACO AND INTRAOCULAR LENS PLACEMENT ([place]) RIGHT;  Surgeon: Nevada CraneKing, Bradley Mark, MD;  [facility];  Service: Ophthalmology;  Laterality: Right;   CATARACT EXTRACTION W/PHACO Left 03/27/2019   Procedure: CATARACT EXTRACTION PHACO AND INTRAOCULAR LENS PLACEMENT ([place]) LEFT;  Surgeon: Nevada CraneKing, Bradley Mark, MD;  [facility];  Service: Ophthalmology;  Laterality: Left;   COLONOSCOPY     COLONOSCOPY WITH PROPOFOL N/A 04/24/2019   Procedure: COLONOSCOPY WITH PROPOFOL;  Surgeon: Midge MiniumWohl, Darren, MD;  [facility];  Service: Endoscopy;  Laterality: N/A;   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   Procedure: 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.;  Surgeon: Christena FlakePoggi, John J, MD;  Location: [facility];  Service: Orthopedics;  Laterality: Right;   FLEXIBLE SIGMOIDOSCOPY N/A 10/09/2019    Procedure: FLEXIBLE SIGMOIDOSCOPY WITH BIOPSY;  Surgeon: Midge MiniumWohl, Darren, MD;  [facility];  Service: Endoscopy;  Laterality: N/A;   FOOT SURGERY     POLYPECTOMY  04/24/2019   Procedure: POLYPECTOMY;  Surgeon: Midge MiniumWohl, Darren, MD;  [facility];  Service: Endoscopy;;   POLYPECTOMY N/A 10/09/2019   Procedure: POLYPECTOMY;  Surgeon: Midge MiniumWohl, Darren, MD;  [facility];  Service: Endoscopy;  Laterality: N/A;   VAGINAL HYSTERECTOMY  1990    There were no vitals filed for this visit.   Subjective Assessment - 11/06/22 0957     Subjective  I am doing much better -using it more and can tell getting more strength -carried some chairs the other day that I could feel was little heavy    Pertinent History Deanna Weaver is a 77 y.o. female who presents for evaluation and treatment and intermittent painful thickened cord in the palmar aspect of her right hand. She notes that these symptoms have been present for 3 to 4 years and primarily affect her ring finger. She describes an itching sensation to her palm on today's visit, but denies any true pain in the finger. However, this area will become more moderately painful at times, reaching as high as a 5/10 score. She has been taking Tylenol as necessary with temporary partial relief of her symptoms. She denies any numbness or paresthesias to her fingers. She also denies any prior injury to her hand. The  patient is right-hand dominant. Pt with a history of a gradually worsening contracture of her right ring finger. The symptoms have progressed despite medications, activity modification, etc. The patient's history and examination are consistent with a Dupuytren's contracture of the right ring finger. Pt underwent surgical procedure for release of DuPuytren's contracture of the right ring on 09-15-2022    Patient Stated Goals Pt reports she wants to be able to be as independent as possible, use hand like she always has in the past.     Currently in Pain? No/denies                [facility] - 11/06/22 0001       Strength   Right Hand Grip (lbs) 40    Right Hand Lateral Pinch 14 lbs    Right Hand 3 Point Pinch 12 lbs    Left Hand Grip (lbs) 55    Left Hand Lateral Pinch 16 lbs    Left Hand 3 Point Pinch 13 lbs      Right Hand AROM   R Index  MCP 0-90 90 Degrees    R Index PIP 0-100 100 Degrees    R Long  MCP 0-90 90 Degrees    R Long PIP 0-100 100 Degrees    R Ring  MCP 0-90 90 Degrees   -10 ext   R Ring PIP 0-100 100 Degrees    R Little  MCP 0-90 90 Degrees    R Little PIP 0-100 100 Degrees                Flexion of digits WNL -maintaining progress in extention of digits and wrist  Assess grip and prehension - see flowsheet Pt report increase strength in R hand and functional use- still little discomfort picking up heavy objects       OT Treatments/Exercises (OP) - 11/06/22 0001       RUE Paraffin   Number Minutes Paraffin 8 Minutes    RUE Paraffin Location Hand    Comments prior to scar massage and ROM               Manual Therapy:  Following paraffin, pt seen for manual therapy for focus on Scar massage and management  with continued  improvement.  Review of and performed scar massage with pt with and without cica scar pad and mini massager - [facility] and [facility] - pt to cont with cica scar pad and silicon sleeve on 4th - to tolerance   -issued new ones to use for another 4 wks at home    Therapeutic Exercises: Following manual therapy, Pt seen for tendon gliding exercises to right hand, thumb opposition WNL  Can cont with  red foam for finger extension with rolling which also worked towards massaging scar.   Table slides for composite extension for digits and wrist 20 reps pain free with cues for proper hand positioning and form.          OT Education - 11/06/22 0959     Education Details progress and changes to HEP    Person(s) Educated  Patient    Methods Explanation;Handout    Comprehension Verbalized understanding;Returned demonstration;Need further instruction              OT Short Term Goals - 11/06/22 1005       OT SHORT TERM GOAL #1   Title Pt will demonstrate knowledge of the use of contrast for  edema control    Status Achieved               OT Long Term Goals - 11/06/22 1005       OT LONG TERM GOAL #1   Title Pt will demonstrate home exercise program with modified independence.    Status Achieved      OT LONG TERM GOAL #2   Title Pt will improve FOTO score to 64 or greater to demonstrate a clinically relevant change to facilitate greater independence in daily tasks.    Status Achieved      OT LONG TERM GOAL #3   Title Pt to demonstrate improved strength and right hand function to peel potatoes with modified independence.    Status Achieved      OT LONG TERM GOAL #4   Title Pt will demonstrate ability to perform meal preparation with modified independence.    Status Achieved                   Plan - 11/06/22 1000     Clinical Impression Statement Pt is a 77 yo female diagnosed with a right Dupuytren's contracture, she underwent surgical procedure for dupuytren's contracture release of the right ring finger on 09/15/2022, Dr. Joice Lofts.    Pt had her sutures removed on 09/28/2022 and was referred to OP Occupational Therapy for evaluation and treatment.  Pt evaluated and presents with post op scar of the right hand, decreased ROM, muscle weakness and decreased functional use of right dominant hand.  Pt made great progress in scar tissue, ROM and grip/prehension strength - pt now 7 1/2 wks s/p.  Pt to cont to focus on extension of digits. and table slides for composite extention. Issued her CICA care for scar management at night time and silicon sleeve on 4th to use for 4 wks. Can follow up with me if needed.    OT Occupational Profile and History Detailed Assessment- Review of Records and  additional review of physical, cognitive, psychosocial history related to current functional performance    Occupational performance deficits (Please refer to evaluation for details): ADL's;IADL's;Leisure    Body Structure / Function / Physical Skills ADL;Dexterity;Flexibility;ROM;Strength;Coordination;Edema;FMC;IADL;Scar mobility;Wound;Pain;Sensation;UE functional use;Fascial restriction;GMC;Skin integrity    Rehab Potential Excellent    Clinical Decision Making Several treatment options, min-mod task modification necessary    Comorbidities Affecting Occupational Performance: May have comorbidities impacting occupational performance    Modification or Assistance to Complete Evaluation  No modification of tasks or assist necessary to complete eval    OT Frequency Biweekly    OT Duration 6 weeks    OT Treatment/Interventions Self-care/ADL training;Cryotherapy;Paraffin;Therapeutic exercise;DME and/or AE instruction;Ultrasound;Fluidtherapy;Neuromuscular education;Manual Therapy;Scar mobilization;Splinting;Moist Heat;Contrast Bath;Passive range of motion;Therapeutic activities;Patient/family education    Consulted and Agree with Plan of Care Patient             Patient will benefit from skilled therapeutic intervention in order to improve the following deficits and impairments:   Body Structure / Function / Physical Skills: ADL, Dexterity, Flexibility, ROM, Strength, Coordination, Edema, FMC, IADL, Scar mobility, Wound, Pain, Sensation, UE functional use, Fascial restriction, GMC, Skin integrity       Visit Diagnosis: Muscle weakness (generalized)  Pain in right hand  Stiffness of right hand, not elsewhere classified  Scar condition and fibrosis of skin    Problem List Patient Active Problem List   Diagnosis Date Noted   History of adenomatous polyp of colon    Special screening for malignant neoplasms, colon  Polyp of ascending colon    Polyp of sigmoid colon    Rectal polyp     Dupuytren's contracture of hand 11/04/2017   Allergic rhinitis 04/02/2015   Glossalgia 04/02/2015   Hypertension 04/02/2015   Tremor 04/02/2015   Varicose veins 04/02/2015   LBP (low back pain) 06/11/2006   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 [place], [address] Phone: [phone]   Fax:  [phone]  Name: ESTELLAR GRAFF MRN: [id number] Date of Birth: May 27, 1946

## 2022-11-08 DIAGNOSIS — J0110 Acute frontal sinusitis, unspecified: Secondary | ICD-10-CM | POA: Diagnosis not present

## 2022-11-08 DIAGNOSIS — H6691 Otitis media, unspecified, right ear: Secondary | ICD-10-CM | POA: Diagnosis not present

## 2022-11-09 ENCOUNTER — Ambulatory Visit (INDEPENDENT_AMBULATORY_CARE_PROVIDER_SITE_OTHER): Payer: Medicare Other | Admitting: Physician Assistant

## 2022-11-09 ENCOUNTER — Encounter: Payer: Self-pay | Admitting: Physician Assistant

## 2022-11-09 VITALS — BP 128/82 | HR 68 | Temp 98.9°F | Ht 61.0 in | Wt 145.5 lb

## 2022-11-09 DIAGNOSIS — H6501 Acute serous otitis media, right ear: Secondary | ICD-10-CM | POA: Diagnosis not present

## 2022-11-09 DIAGNOSIS — H9201 Otalgia, right ear: Secondary | ICD-10-CM | POA: Diagnosis not present

## 2022-11-09 MED ORDER — PREDNISONE 20 MG PO TABS: 20.0000 mg | ORAL_TABLET | Freq: Every day | ORAL | 0 refills | Status: DC

## 2022-11-09 NOTE — Progress Notes (Unsigned)
I,J'ya E Sharna Gabrys,acting as a scribe for [employer], PA-C.,have documented all relevant documentation on the behalf of Debera Lat, PA-C,as directed by  [employer], PA-C while in the presence of [employer], PA-C.   Established patient visit   Patient: Deanna Weaver   DOB: Sep 25, 1945   77 y.o. Female  MRN: [id number] Visit Date: 11/09/2022  Today's healthcare provider: Debera Lat, PA-C   No chief complaint on file.  Subjective    Otalgia  There is pain in the right ear. This is a new problem. Episode onset: Saturday night (11/07/2022) The problem has been unchanged. There has been no fever. The pain is at a severity of 8/10. Associated symptoms include rhinorrhea. Pertinent negatives include no headaches. She has tried acetaminophen for the symptoms. The treatment provided no relief.  Patient currently taking Clindamycin with no relief.    Medications: Outpatient Medications Prior to Visit  Medication Sig   calcium carbonate (OS-CAL - DOSED IN MG OF ELEMENTAL CALCIUM) 1250 (500 Ca) MG tablet Take 1 tablet by mouth daily.   CRANBERRY PO Take 1 tablet by mouth 2 (two) times daily.   fluticasone (FLONASE) 50 MCG/ACT nasal spray Place 2 sprays into both nostrils daily as needed. 2(two) nasal each nostril once a day as needed   HYDROcodone-acetaminophen (NORCO/VICODIN) 5-325 MG tablet Take 1-2 tablets by mouth every 6 (six) hours as needed for moderate pain or severe pain.   latanoprost (XALATAN) 0.005 % ophthalmic solution Place 1 drop into both eyes at bedtime.   lisinopril (ZESTRIL) 5 MG tablet TAKE 1 TABLET BY MOUTH EVERY DAY   meloxicam (MOBIC) 7.5 MG tablet Take 1 tablet (7.5 mg total) by mouth daily. (Patient taking differently: Take 7.5 mg by mouth daily as needed for pain.)   montelukast (SINGULAIR) 10 MG tablet Take 1 tablet (10 mg total) by mouth at bedtime. For allergies   Omega-3 Fatty Acids (FISH OIL) 1000 MG CAPS Take 2 capsules by mouth daily.   omeprazole  (PRILOSEC) 40 MG capsule Take 1 capsule (40 mg total) by mouth daily.   simvastatin (ZOCOR) 40 MG tablet TAKE 1 TABLET BY MOUTH EVERYDAY AT BEDTIME   No facility-administered medications prior to visit.    Review of Systems  HENT:  Positive for ear pain and rhinorrhea.   Neurological:  Negative for headaches.    {Labs  Heme  Chem  Endocrine  Serology  Results Review (optional):23779}   Objective    BP (!) 144/72 (BP Location: Right Arm, Patient Position: Sitting, Cuff Size: Normal)   Pulse 68   Temp 98.9 F (37.2 C) (Oral)   Ht 5\' 1"  (1.549 m)   Wt 145 lb 8 oz (66 kg)   SpO2 99%   BMI 27.49 kg/m  {Show previous vital signs (optional):23777}  Physical Exam Vitals reviewed.  Constitutional:      General: She is not in acute distress.    Appearance: Normal appearance. She is well-developed. She is not diaphoretic.  HENT:     Head: Normocephalic and atraumatic.     Right Ear: Ear canal and external ear normal.     Left Ear: Ear canal and external ear normal.     Ears:     Comments: Fluids behind TMs    Nose: Congestion and rhinorrhea present.     Mouth/Throat:     Pharynx: No posterior oropharyngeal erythema.  Eyes:     General: No scleral icterus.       Right eye:  No discharge.        Left eye: No discharge.     Extraocular Movements: Extraocular movements intact.     Conjunctiva/sclera: Conjunctivae normal.     Pupils: Pupils are equal, round, and reactive to light.  Neck:     Thyroid: No thyromegaly.  Cardiovascular:     Rate and Rhythm: Normal rate and regular rhythm.     Pulses: Normal pulses.     Heart sounds: Normal heart sounds. No murmur heard. Pulmonary:     Effort: Pulmonary effort is normal. No respiratory distress.     Breath sounds: Normal breath sounds. No wheezing, rhonchi or rales.  Musculoskeletal:     Cervical back: Neck supple.     Right lower leg: No edema.     Left lower leg: No edema.  Lymphadenopathy:     Cervical: No cervical  adenopathy.  Skin:    General: Skin is warm and dry.     Findings: No rash.  Neurological:     Mental Status: She is alert and oriented to person, place, and time. Mental status is at baseline.  Psychiatric:        Behavior: Behavior normal.        Thought Content: Thought content normal.        Judgment: Judgment normal.      No results found for any visits on 11/09/22.  Assessment & Plan     ***  No follow-ups on file.      {provider attestation***:1}   Debera Lat, PA-C  [facility] [phone] (phone) [phone] (fax)  [facility]

## 2022-11-10 DIAGNOSIS — H6501 Acute serous otitis media, right ear: Secondary | ICD-10-CM | POA: Insufficient documentation

## 2022-11-10 DIAGNOSIS — H9201 Otalgia, right ear: Secondary | ICD-10-CM | POA: Insufficient documentation

## 2022-12-01 DIAGNOSIS — H401131 Primary open-angle glaucoma, bilateral, mild stage: Secondary | ICD-10-CM | POA: Diagnosis not present

## 2022-12-17 DIAGNOSIS — H401131 Primary open-angle glaucoma, bilateral, mild stage: Secondary | ICD-10-CM | POA: Diagnosis not present

## 2022-12-17 NOTE — Progress Notes (Deleted)
Established patient visit   Patient: Deanna Weaver   DOB: 24-May-1946   77 y.o. Female  MRN: [id number] Visit Date: 12/18/2022  Today's healthcare provider: Mila Merry, MD   No chief complaint on file.  Subjective    HPI  Hypertension, follow-up  BP Readings from Last 3 Encounters:  11/09/22 128/82  09/15/22 (!) 159/70  09/04/22 (!) 153/78   Wt Readings from Last 3 Encounters:  11/09/22 145 lb 8 oz (66 kg)  09/15/22 141 lb (64 kg)  09/04/22 141 lb (64 kg)     She was last seen for hypertension 6 months ago.  BP at that visit was 137/63. Management since that visit includes continue current treatment.  She reports {excellent/good/fair/poor:19665} compliance with treatment. She {is/is not:9024} having side effects. {document side effects if present:1}  Outside blood pressures are {***enter patient reported home BP readings, or 'not being checked':1}. Symptoms: {Yes/No:20286} chest pain {Yes/No:20286} chest pressure  {Yes/No:20286} palpitations {Yes/No:20286} syncope  {Yes/No:20286} dyspnea {Yes/No:20286} orthopnea  {Yes/No:20286} paroxysmal nocturnal dyspnea {Yes/No:20286} lower extremity edema   Pertinent labs Lab Results  Component Value Date   CHOL 155 12/08/2021   HDL 35 (L) 12/08/2021   LDLCALC 87 12/08/2021   TRIG 192 (H) 12/08/2021   CHOLHDL 4.4 12/08/2021   Lab Results  Component Value Date   NA 143 12/08/2021   K 5.1 12/08/2021   CREATININE 0.72 12/08/2021   EGFR 87 12/08/2021   GLUCOSE 114 (H) 12/08/2021     The 10-year ASCVD risk score (Arnett DK, et al., 2019) is: 22.4% ---------------------------------------------------------------------------------------------------  Diabetes Mellitus Type II, Follow-up  Lab Results  Component Value Date   HGBA1C 5.7 (A) 06/19/2022   Wt Readings from Last 3 Encounters:  11/09/22 145 lb 8 oz (66 kg)  09/15/22 141 lb (64 kg)  09/04/22 141 lb (64 kg)   Last seen for diabetes 6 months ago.   Management since then includes continue no sugar diet. Symptoms: {Yes/No:20286} fatigue {Yes/No:20286} foot ulcerations  {Yes/No:20286} appetite changes {Yes/No:20286} nausea  {Yes/No:20286} paresthesia of the feet  {Yes/No:20286} polydipsia  {Yes/No:20286} polyuria {Yes/No:20286} visual disturbances   {Yes/No:20286} vomiting     Home blood sugar records: {diabetes glucometry results:16657}  Episodes of hypoglycemia? {Yes/No:20286} {enter symptoms and frequency of symptoms if yes:1}   Current insulin regiment: none Pertinent Labs: Lab Results  Component Value Date   CHOL 155 12/08/2021   HDL 35 (L) 12/08/2021   LDLCALC 87 12/08/2021   TRIG 192 (H) 12/08/2021   CHOLHDL 4.4 12/08/2021   Lab Results  Component Value Date   NA 143 12/08/2021   K 5.1 12/08/2021   CREATININE 0.72 12/08/2021   EGFR 87 12/08/2021    ---------------------------------------------------------------------------------------------------  Lipid/Cholesterol, Follow-up  Last lipid panel Other pertinent labs  Lab Results  Component Value Date   CHOL 155 12/08/2021   HDL 35 (L) 12/08/2021   LDLCALC 87 12/08/2021   TRIG 192 (H) 12/08/2021   CHOLHDL 4.4 12/08/2021   Lab Results  Component Value Date   ALT 19 12/08/2021   AST 21 12/08/2021   PLT 176 12/08/2021     She was last seen for this 1 years ago.  Management since that visit includes continue Simvastatin 40 mg.  She reports {excellent/good/fair/poor:19665} compliance with treatment. She {is/is not:9024} having side effects. {document side effects if present:1}   Medications: Outpatient Medications Prior to Visit  Medication Sig   calcium carbonate (OS-CAL - DOSED IN MG OF ELEMENTAL CALCIUM) 1250 (500 Ca)  MG tablet Take 1 tablet by mouth daily.   CRANBERRY PO Take 1 tablet by mouth 2 (two) times daily.   fluticasone (FLONASE) 50 MCG/ACT nasal spray Place 2 sprays into both nostrils daily as needed. 2(two) nasal each nostril once a day as  needed   HYDROcodone-acetaminophen (NORCO/VICODIN) 5-325 MG tablet Take 1-2 tablets by mouth every 6 (six) hours as needed for moderate pain or severe pain.   latanoprost (XALATAN) 0.005 % ophthalmic solution Place 1 drop into both eyes at bedtime.   lisinopril (ZESTRIL) 5 MG tablet TAKE 1 TABLET BY MOUTH EVERY DAY   meloxicam (MOBIC) 7.5 MG tablet Take 1 tablet (7.5 mg total) by mouth daily. (Patient taking differently: Take 7.5 mg by mouth daily as needed for pain.)   montelukast (SINGULAIR) 10 MG tablet Take 1 tablet (10 mg total) by mouth at bedtime. For allergies   Omega-3 Fatty Acids (FISH OIL) 1000 MG CAPS Take 2 capsules by mouth daily.   omeprazole (PRILOSEC) 40 MG capsule Take 1 capsule (40 mg total) by mouth daily.   predniSONE (DELTASONE) 20 MG tablet Take 1 tablet (20 mg total) by mouth daily with breakfast.   simvastatin (ZOCOR) 40 MG tablet TAKE 1 TABLET BY MOUTH EVERYDAY AT BEDTIME   No facility-administered medications prior to visit.    Review of Systems  {Labs  Heme  Chem  Endocrine  Serology  Results Review (optional):23779}   Objective    There were no vitals taken for this visit. {Show previous vital signs (optional):23777}  Physical Exam  ***  No results found for any visits on 12/18/22.  Assessment & Plan     ***  No follow-ups on file.      {provider attestation***:1}   Mila Merry, MD  [facility] [phone] (phone) [phone] (fax)  [facility]

## 2022-12-18 ENCOUNTER — Ambulatory Visit: Payer: Medicare Other | Admitting: Family Medicine

## 2022-12-21 ENCOUNTER — Encounter: Payer: Self-pay | Admitting: Family Medicine

## 2022-12-21 ENCOUNTER — Ambulatory Visit (INDEPENDENT_AMBULATORY_CARE_PROVIDER_SITE_OTHER): Payer: Medicare Other | Admitting: Family Medicine

## 2022-12-21 VITALS — BP 149/68 | HR 69 | Temp 97.7°F | Ht 61.0 in | Wt 144.0 lb

## 2022-12-21 DIAGNOSIS — R319 Hematuria, unspecified: Secondary | ICD-10-CM

## 2022-12-21 DIAGNOSIS — N390 Urinary tract infection, site not specified: Secondary | ICD-10-CM | POA: Diagnosis not present

## 2022-12-21 DIAGNOSIS — J309 Allergic rhinitis, unspecified: Secondary | ICD-10-CM | POA: Diagnosis not present

## 2022-12-21 DIAGNOSIS — R350 Frequency of micturition: Secondary | ICD-10-CM | POA: Diagnosis not present

## 2022-12-21 LAB — POCT URINALYSIS DIPSTICK
Glucose, UA: NEGATIVE
Protein, UA: NEGATIVE
Spec Grav, UA: 1.015 (ref 1.010–1.025)
Urobilinogen, UA: NEGATIVE E.U./dL — AB
pH, UA: 5 (ref 5.0–8.0)

## 2022-12-21 MED ORDER — FLUTICASONE PROPIONATE 50 MCG/ACT NA SUSP: 2.0000 | Freq: Every day | NASAL | 5 refills | Status: DC | PRN

## 2022-12-21 MED ORDER — CIPROFLOXACIN HCL 250 MG PO TABS: 250.0000 mg | ORAL_TABLET | Freq: Two times a day (BID) | ORAL | 0 refills | Status: AC

## 2022-12-21 NOTE — Progress Notes (Signed)
Established patient visit   Patient: Deanna Weaver   DOB: [birth year]   77 y.o. Female  MRN: [id number] Visit Date: 12/21/2022  Today's healthcare provider: Mila Merry, MD   Chief Complaint  Patient presents with   Urinary Tract Infection   Subjective    HPI This is a recurrent problem.  Recent episode started yesterday.  The problem has been gradually worsening since onset.  Severity of the pain is moderate.  The problem occurs intermittently.  The patient  has been recently treated for similar symptoms.  Abdominal Pain: Present.  Back Pain: Absent.  Chills: Absent.  Cloudy malodorus urine: Absent.  Constipation: Absent.  Cramping: Absent.  Diarrhea: Absent.  Discharge: Absent.  Fever: Absent.  Hematuria: Absent.  Nausea: Absent.  Vomiting: Absent    Medications: Outpatient Medications Prior to Visit  Medication Sig   calcium carbonate (OS-CAL - DOSED IN MG OF ELEMENTAL CALCIUM) 1250 (500 Ca) MG tablet Take 1 tablet by mouth daily.   CRANBERRY PO Take 1 tablet by mouth 2 (two) times daily.   fluticasone (FLONASE) 50 MCG/ACT nasal spray Place 2 sprays into both nostrils daily as needed. 2(two) nasal each nostril once a day as needed   HYDROcodone-acetaminophen (NORCO/VICODIN) 5-325 MG tablet Take 1-2 tablets by mouth every 6 (six) hours as needed for moderate pain or severe pain.   latanoprost (XALATAN) 0.005 % ophthalmic solution Place 1 drop into both eyes at bedtime.   lisinopril (ZESTRIL) 5 MG tablet TAKE 1 TABLET BY MOUTH EVERY DAY   meloxicam (MOBIC) 7.5 MG tablet Take 1 tablet (7.5 mg total) by mouth daily. (Patient taking differently: Take 7.5 mg by mouth daily as needed for pain.)   montelukast (SINGULAIR) 10 MG tablet Take 1 tablet (10 mg total) by mouth at bedtime. For allergies   Omega-3 Fatty Acids (FISH OIL) 1000 MG CAPS Take 2 capsules by mouth daily.   omeprazole (PRILOSEC) 40 MG capsule Take 1 capsule (40 mg total) by mouth daily.   predniSONE (DELTASONE) 20  MG tablet Take 1 tablet (20 mg total) by mouth daily with breakfast.   simvastatin (ZOCOR) 40 MG tablet TAKE 1 TABLET BY MOUTH EVERYDAY AT BEDTIME   No facility-administered medications prior to visit.         Objective    BP (!) 149/68   Pulse 69   Temp 97.7 F (36.5 C)   Ht 5\' 1"  (1.549 m)   Wt 144 lb (65.3 kg)   SpO2 96%   BMI 27.21 kg/m    Physical Exam   General appearance: Well developed, well nourished female, cooperative and in no acute distress Head: Normocephalic, without obvious abnormality, atraumatic Respiratory: Respirations even and unlabored, normal respiratory rate Extremities: All extremities are intact.  Skin: Skin color, texture, turgor normal. No rashes seen  Psych: Appropriate mood and affect. Neurologic: Mental status: Alert, oriented to person, place, and time, thought content appropriate.   Results for orders placed or performed in visit on 12/21/22  POCT urinalysis dipstick  Result Value Ref Range   Color, UA yellow    Clarity, UA cloudy    Glucose, UA Negative Negative   Bilirubin, UA small    Ketones, UA trace    Spec Grav, UA 1.015 1.010 - 1.025   Blood, UA large    pH, UA 5.0 5.0 - 8.0   Protein, UA Negative Negative   Urobilinogen, UA negative (A) 0.2 or 1.0 E.U./dL   Nitrite, UA positve  Leukocytes, UA Large (3+) (A) Negative   Appearance     Odor      Assessment & Plan     1. Urinary tract infection with hematuria, site unspecified  - Urine Culture - ciprofloxacin (CIPRO) 250 MG tablet; Take 1 tablet (250 mg total) by mouth 2 (two) times daily for 5 days.  Dispense: 10 tablet; Refill: 0  2. Allergic rhinitis, unspecified seasonality, unspecified trigger refill fluticasone (FLONASE) 50 MCG/ACT nasal spray; Place 2 sprays into both nostrils daily as needed. 2(two) nasal each nostril once a day as needed  Dispense: 16 g; Refill: 5         Mila Merry, MD  [facility] [phone]  (phone) [phone] (fax)  [facility]

## 2022-12-24 LAB — URINE CULTURE

## 2022-12-24 LAB — SPECIMEN STATUS REPORT

## 2022-12-28 ENCOUNTER — Other Ambulatory Visit: Payer: Self-pay | Admitting: Family Medicine

## 2022-12-28 DIAGNOSIS — R490 Dysphonia: Secondary | ICD-10-CM

## 2023-01-29 ENCOUNTER — Other Ambulatory Visit: Payer: Self-pay | Admitting: Family Medicine

## 2023-01-29 DIAGNOSIS — J309 Allergic rhinitis, unspecified: Secondary | ICD-10-CM

## 2023-02-04 DIAGNOSIS — N390 Urinary tract infection, site not specified: Secondary | ICD-10-CM | POA: Diagnosis not present

## 2023-02-15 DIAGNOSIS — R300 Dysuria: Secondary | ICD-10-CM | POA: Diagnosis not present

## 2023-02-15 DIAGNOSIS — N3001 Acute cystitis with hematuria: Secondary | ICD-10-CM | POA: Diagnosis not present

## 2023-02-25 ENCOUNTER — Other Ambulatory Visit: Payer: Self-pay | Admitting: Family Medicine

## 2023-03-05 ENCOUNTER — Ambulatory Visit (INDEPENDENT_AMBULATORY_CARE_PROVIDER_SITE_OTHER): Payer: Medicare Other | Admitting: Family Medicine

## 2023-03-05 ENCOUNTER — Encounter: Payer: Self-pay | Admitting: Family Medicine

## 2023-03-05 VITALS — BP 124/67 | HR 70 | Temp 98.3°F | Resp 16 | Ht 61.0 in | Wt 145.9 lb

## 2023-03-05 DIAGNOSIS — H6691 Otitis media, unspecified, right ear: Secondary | ICD-10-CM

## 2023-03-05 MED ORDER — NEOMYCIN-POLYMYXIN-HC 3.5-10000-1 OT SOLN: 4.0000 [drp] | Freq: Four times a day (QID) | OTIC | 0 refills | Status: AC

## 2023-03-05 MED ORDER — DOXYCYCLINE HYCLATE 100 MG PO TABS: 100.0000 mg | ORAL_TABLET | Freq: Two times a day (BID) | ORAL | 0 refills | Status: AC

## 2023-03-05 NOTE — Progress Notes (Signed)
Established patient visit   Patient: Deanna Weaver   DOB: 05-21-1946   77 y.o. Female  MRN: [id number] Visit Date: 03/05/2023  Today's healthcare provider: Mila Merry, MD   Chief Complaint  Patient presents with   Ear Pain    Right ear pain 3-4 days. She has been taking Azithromycin 250 mg that her dentist prescribed for a tooth canal. She reports pain is still present even after taking medication. She has needed to take 2 tylenol along with azithromycin. She denies any discharge or fever.    Subjective    Discussed the use of AI scribe software for clinical note transcription with the patient, who gave verbal consent to proceed.  History of Present Illness   The patient presents with severe right ear pain that has been persisting for three to four days. The pain was described as a 9 or 10 on the pain scale, severe enough to induce tears. The patient had recently undergone a root canal procedure about two weeks prior to the onset of the ear pain. The dentist had prescribed azithromycin, which the patient started taking when her ear starting hurting, but it did not alleviate the ear pain. The patient also reported a sensation of water in the ear, which she was unable to remove. There were no reported symptoms of a cold, such as a runny or stuffy nose. The patient had been using a heat pad for pain management.       Medications: Outpatient Medications Prior to Visit  Medication Sig   azithromycin (ZITHROMAX) 250 MG tablet Take 250 mg by mouth daily.   calcium carbonate (OS-CAL - DOSED IN MG OF ELEMENTAL CALCIUM) 1250 (500 Ca) MG tablet Take 1 tablet by mouth daily.   CRANBERRY PO Take 1 tablet by mouth 2 (two) times daily.   fluticasone (FLONASE) 50 MCG/ACT nasal spray Place 2 sprays into both nostrils daily as needed. 2(two) nasal each nostril once a day as needed   latanoprost (XALATAN) 0.005 % ophthalmic solution Place 1 drop into both eyes at bedtime.   lisinopril (ZESTRIL)  5 MG tablet TAKE 1 TABLET BY MOUTH EVERY DAY   meloxicam (MOBIC) 7.5 MG tablet Take 1 tablet (7.5 mg total) by mouth daily. (Patient taking differently: Take 7.5 mg by mouth daily as needed for pain.)   montelukast (SINGULAIR) 10 MG tablet TAKE 1 TABLET (10 MG TOTAL) BY MOUTH DAILY. FOR ALLERGIES   Omega-3 Fatty Acids (FISH OIL) 1000 MG CAPS Take 2 capsules by mouth daily.   omeprazole (PRILOSEC) 40 MG capsule TAKE 1 CAPSULE (40 MG TOTAL) BY MOUTH DAILY.   simvastatin (ZOCOR) 40 MG tablet TAKE 1 TABLET BY MOUTH EVERYDAY AT BEDTIME   [DISCONTINUED] HYDROcodone-acetaminophen (NORCO/VICODIN) 5-325 MG tablet Take 1-2 tablets by mouth every 6 (six) hours as needed for moderate pain or severe pain. (Patient not taking: Reported on 03/05/2023)   No facility-administered medications prior to visit.      Objective    BP 124/67 (BP Location: Left Arm, Patient Position: Sitting, Cuff Size: Normal)   Pulse 70   Temp 98.3 F (36.8 C) (Temporal)   Resp 16   Ht 5\' 1"  (1.549 m)   Wt 145 lb 14.4 oz (66.2 kg)   SpO2 99%   BMI 27.57 kg/m  Physical Exam  General Appearance:    Well developed, well nourished female, alert, cooperative, in no acute distress  HENT:   ENT exam normal, no neck nodes or sinus tenderness,  left TM normal without fluid or infection, and right TM red, dull, bulging  Eyes:    PERRL, conjunctiva/corneas clear, EOM's intact       Lungs:     Clear to auscultation bilaterally, respirations unlabored  Heart:    Normal heart rate. Normal rhythm. No murmurs, rubs, or gallops.    Neurologic:   Awake, alert, oriented x 3. No apparent focal neurological           defect.      \  Assessment & Plan     Assessment and Plan    Right Otitis Externa Severe pain for 3-4 days. No relief with Azithromycin. Possible water exposure. No systemic symptoms. -Prescribe Doxycycline, cautioning about photosensitivity. -Prescribe Cortisporin ear drops for 4-5 days. -Call if not much better over the  weekend       No follow-ups on file.      Mila Merry, MD  [facility] [phone] (phone) [phone] (fax)  [facility]

## 2023-03-29 ENCOUNTER — Other Ambulatory Visit: Payer: Self-pay | Admitting: Family Medicine

## 2023-03-29 DIAGNOSIS — Z1231 Encounter for screening mammogram for malignant neoplasm of breast: Secondary | ICD-10-CM

## 2023-04-06 ENCOUNTER — Other Ambulatory Visit: Payer: Self-pay | Admitting: Family Medicine

## 2023-04-06 DIAGNOSIS — R490 Dysphonia: Secondary | ICD-10-CM

## 2023-04-08 NOTE — Telephone Encounter (Signed)
Requested Prescriptions  Pending Prescriptions Disp Refills   omeprazole (PRILOSEC) 40 MG capsule [Pharmacy Med Name: OMEPRAZOLE DR 40 MG CAPSULE] 90 capsule 0    Sig: TAKE 1 CAPSULE (40 MG TOTAL) BY MOUTH DAILY.     Gastroenterology: Proton Pump Inhibitors Passed - 04/06/2023  2:54 PM      Passed - Valid encounter within last 12 months    Recent Outpatient Visits           1 month ago Otitis of right ear   [facility] Malva Limes, MD   3 months ago Urinary tract infection with hematuria, site unspecified   [facility] Malva Limes, MD   5 months ago Otalgia of right ear   [facility] [place], [place], [place]   8 months ago Acute cystitis without hematuria   [facility] Alfredia Ferguson, PA-C   9 months ago Primary hypertension   [facility] Malva Limes, MD

## 2023-04-14 ENCOUNTER — Ambulatory Visit
Admission: RE | Admit: 2023-04-14 | Discharge: 2023-04-14 | Disposition: A | Discharge status: Home / Self Care | Payer: Medicare Other | Source: Ambulatory Visit | Attending: Family Medicine | Admitting: Family Medicine

## 2023-04-14 DIAGNOSIS — Z1231 Encounter for screening mammogram for malignant neoplasm of breast: Secondary | ICD-10-CM | POA: Diagnosis not present

## 2023-05-03 ENCOUNTER — Ambulatory Visit (INDEPENDENT_AMBULATORY_CARE_PROVIDER_SITE_OTHER): Payer: Medicare Other

## 2023-05-03 VITALS — BP 118/70 | Ht 61.0 in | Wt 145.3 lb

## 2023-05-03 DIAGNOSIS — Z0000 Encounter for general adult medical examination without abnormal findings: Secondary | ICD-10-CM | POA: Diagnosis not present

## 2023-05-03 NOTE — Progress Notes (Signed)
Subjective:   Deanna Weaver is a 77 y.o. female who presents for Medicare Annual (Subsequent) preventive examination.  Visit Complete: Virtual  I connected with  Jeane H Gudger on 05/03/23 in person and verified that I am speaking with the correct person using two identifiers.  Patient Location: in person  Provider Location: Office/Clinic  I discussed the limitations of evaluation and management by telemedicine. The patient expressed understanding and agreed to proceed.  Because this visit was a virtual/telehealth visit, some criteria may be missing or patient reported. Any vitals not documented were not able to be obtained and vitals that have been documented are patient reported.   Patient Medicare AWV questionnaire was completed by the patient on (not done); I have confirmed that all information answered by patient is correct and no changes since this date. Cardiac Risk Factors include: advanced age (>32men, >7 women);hypertension;dyslipidemia    Objective:    Today's Vitals   05/03/23 1115  BP: 118/70  Weight: 145 lb 4.8 oz (65.9 kg)  Height: 5\' 1"  (1.549 m)   Body mass index is 27.45 kg/m.     05/03/2023   11:27 AM 09/15/2022    9:56 AM 09/04/2022    8:20 AM 04/29/2022    2:19 PM 04/04/2020    2:46 PM 10/09/2019    7:14 AM 04/24/2019    9:32 AM  Advanced Directives  Does Patient Have a Medical Advance Directive? No No No No No No No  Would patient like information on creating a medical advance directive?  No - Patient declined No - Patient declined No - Patient declined No - Patient declined No - Patient declined No - Patient declined    Current Medications (verified) Outpatient Encounter Medications as of 05/03/2023  Medication Sig   calcium carbonate (OS-CAL - DOSED IN MG OF ELEMENTAL CALCIUM) 1250 (500 Ca) MG tablet Take 1 tablet by mouth daily.   CRANBERRY PO Take 1 tablet by mouth 2 (two) times daily.   fluticasone (FLONASE) 50 MCG/ACT nasal spray Place 2 sprays into  both nostrils daily as needed. 2(two) nasal each nostril once a day as needed   latanoprost (XALATAN) 0.005 % ophthalmic solution Place 1 drop into both eyes at bedtime.   lisinopril (ZESTRIL) 5 MG tablet TAKE 1 TABLET BY MOUTH EVERY DAY   meloxicam (MOBIC) 7.5 MG tablet Take 1 tablet (7.5 mg total) by mouth daily. (Patient taking differently: Take 7.5 mg by mouth daily as needed for pain.)   montelukast (SINGULAIR) 10 MG tablet TAKE 1 TABLET (10 MG TOTAL) BY MOUTH DAILY. FOR ALLERGIES   Omega-3 Fatty Acids (FISH OIL) 1000 MG CAPS Take 2 capsules by mouth daily.   omeprazole (PRILOSEC) 40 MG capsule TAKE 1 CAPSULE (40 MG TOTAL) BY MOUTH DAILY.   simvastatin (ZOCOR) 40 MG tablet TAKE 1 TABLET BY MOUTH EVERYDAY AT BEDTIME   No facility-administered encounter medications on file as of 05/03/2023.    Allergies (verified) Ancef [cefazolin], Celecoxib, Metoprolol, Preservision areds [actical], Cefdinir, Penicillins, and Penlac [ciclopirox]   History: Past Medical History:  Diagnosis Date   Allergic rhinitis    Allergy    Arthritis    lower back   GERD (gastroesophageal reflux disease)    Hyperlipidemia    Hypertension    Past Surgical History:  Procedure Laterality Date   ABDOMINAL HYSTERECTOMY     CATARACT EXTRACTION W/ INTRAOCULAR LENS IMPLANT Right    CATARACT EXTRACTION W/PHACO Right 01/16/2019   Procedure: CATARACT EXTRACTION PHACO AND INTRAOCULAR  LENS PLACEMENT (IOC) RIGHT;  Surgeon: Nevada Crane, MD;  Location: [facility];  Service: Ophthalmology;  Laterality: Right;   CATARACT EXTRACTION W/PHACO Left 03/27/2019   Procedure: CATARACT EXTRACTION PHACO AND INTRAOCULAR LENS PLACEMENT (IOC) LEFT;  Surgeon: Nevada Crane, MD;  Location: [facility];  Service: Ophthalmology;  Laterality: Left;   COLONOSCOPY     COLONOSCOPY WITH PROPOFOL N/A 04/24/2019   Procedure: COLONOSCOPY WITH PROPOFOL;  Surgeon: Midge Minium, MD;  Location: [facility];  Service:  Endoscopy;  Laterality: N/A;   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   Procedure: 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.;  Surgeon: Christena Flake, MD;  Location: [facility];  Service: Orthopedics;  Laterality: Right;   FLEXIBLE SIGMOIDOSCOPY N/A 10/09/2019   Procedure: FLEXIBLE SIGMOIDOSCOPY WITH BIOPSY;  Surgeon: Midge Minium, MD;  Location[facility];  Service: Endoscopy;  Laterality: N/A;   FOOT SURGERY     POLYPECTOMY  04/24/2019   Procedure: POLYPECTOMY;  Surgeon: Midge Minium, MD;  Location: [facility];  Service: Endoscopy;;   POLYPECTOMY N/A 10/09/2019   Procedure: POLYPECTOMY;  Surgeon: Midge Minium, MD;  Location: [facility];  Service: Endoscopy;  Laterality: N/A;   VAGINAL HYSTERECTOMY  1990   Family History  Problem Relation Age of Onset   Cancer Mother    Heart disease Father    Social History   Socioeconomic History   Marital status: Married    Spouse name: Not on file   Number of children: 4   Years of education: Not on file   Highest education level: 11th grade  Occupational History   Occupation: Retired    Comment: keeps granchildren  Tobacco Use   Smoking status: Never   Smokeless tobacco: Never  Vaping Use   Vaping status: Never Used  Substance and Sexual Activity   Alcohol use: Never   Drug use: No   Sexual activity: Not Currently  Other Topics Concern   Not on file  Social History Narrative   ** Merged History Encounter **       Social Determinants of Health   Financial Resource Strain: Low Risk  (05/03/2023)   Overall Financial Resource Strain (CARDIA)    Difficulty of Paying Living Expenses: Not hard at all  Food Insecurity: No Food Insecurity (05/03/2023)   Hunger Vital Sign    Worried About Running Out of Food in the Last Year: Never true    Ran Out of Food in the Last Year: Never true  Transportation Needs: No Transportation Needs (05/03/2023)   PRAPARE - Scientist, research (physical sciences) (Medical): No    Lack of Transportation (Non-Medical): No  Physical Activity: Sufficiently Active (05/03/2023)   Exercise Vital Sign    Days of Exercise per Week: 5 days    Minutes of Exercise per Session: 60 min  Stress: No Stress Concern Present (05/03/2023)   [employer] of Occupational Health - Occupational Stress Questionnaire    Feeling of Stress : Not at all  Social Connections: Moderately Integrated (05/03/2023)   Social Connection and Isolation Panel [NHANES]    Frequency of Communication with Friends and Family: More than three times a week    Frequency of Social Gatherings with Friends and Family: More than three times a week    Attends Religious Services: More than 4 times per year    Active Member of [employer] or Organizations: No    Attends Banker Meetings: Never  Marital Status: Married    Tobacco Counseling Counseling given: Not Answered   Clinical Intake:  Pre-visit preparation completed: Yes  Pain : No/denies pain     BMI - recorded: 27.45 Nutritional Status: BMI 25 -29 Overweight Nutritional Risks: None Diabetes: No  How often do you need to have someone help you when you read instructions, pamphlets, or other written materials from your doctor or pharmacy?: 1 - Never  Interpreter Needed?: No  Comments: lives w/husband Information entered by :: B.Ryen Heitmeyer,LPN   Activities of Daily Living    05/03/2023   11:27 AM 09/04/2022    8:22 AM  In your present state of health, do you have any difficulty performing the following activities:  Hearing? 1   Vision? 0   Difficulty concentrating or making decisions? 0   Walking or climbing stairs? 0   Dressing or bathing? 0   Doing errands, shopping? 0 0  Preparing Food and eating ? N   Using the Toilet? N   In the past six months, have you accidently leaked urine? Y   Do you have problems with loss of bowel control? N   Managing your Medications? N   Managing your Finances? N    Housekeeping or managing your Housekeeping? N     Patient Care Team: Malva Limes, MD as PCP - General (Family Medicine) Nevada Crane, MD as Consulting Physician (Ophthalmology) Midge Minium, MD as Consulting Physician (Gastroenterology)  Indicate any recent Medical Services you may have received from other than [facility] providers in the past year (date may be approximate).     Assessment:   This is a routine wellness examination for Kalysta.  Hearing/Vision screen Hearing Screening - Comments:: Pt says her hearing is not quite as good in left ear Vision Screening - Comments:: Adequate vision after cataract surgery;only readers Dr Brooke Dare   Goals Addressed             This Visit's Progress    COMPLETED: DIET - EAT MORE FRUITS AND VEGETABLES   On track    COMPLETED: Exercise 3x per week (30 min per time)   On track    Recommend to start walking 3 days a week for 30 minutes at a time. Pt to start this when the weather gets nice.      Increase water intake   Not on track    Recommend increasing water intake to 4 glasses a day.       Depression Screen    05/03/2023   11:21 AM 03/05/2023   10:14 AM 11/09/2022    3:07 PM 08/06/2022    2:43 PM 06/19/2022    3:33 PM 04/29/2022    2:18 PM 03/18/2022    8:36 AM  PHQ 2/9 Scores  PHQ - 2 Score 0 0 0 0 0 0 0  PHQ- 9 Score   0 0 0 0 0    Fall Risk    05/03/2023   11:19 AM 03/05/2023   10:14 AM 11/09/2022    3:07 PM 08/06/2022    2:43 PM 04/29/2022    2:20 PM  Fall Risk   Falls in the past year? 0 0 0 0 0  Number falls in past yr: 0 0 0 0 0  Injury with Fall? 0 0 0 0 0  Risk for fall due to : No Fall Risks No Fall Risks No Fall Risks No Fall Risks No Fall Risks  Follow up Education provided;Falls prevention discussed Falls evaluation completed  Falls evaluation completed Falls prevention discussed;Falls evaluation completed    MEDICARE RISK AT HOME: Medicare Risk at Home Any stairs in or around the home?: Yes If so, are there  any without handrails?: Yes Home free of loose throw rugs in walkways, pet beds, electrical cords, etc?: Yes Adequate lighting in your home to reduce risk of falls?: Yes Life alert?: No Use of a cane, walker or w/c?: No Grab bars in the bathroom?: No Shower chair or bench in shower?: No Elevated toilet seat or a handicapped toilet?: No  TIMED UP AND GO:  Was the test performed?  No    Cognitive Function:        05/03/2023   11:28 AM 04/29/2022    2:21 PM 04/26/2021   11:20 AM 10/23/2016    9:09 AM  6CIT Screen  What Year? 0 points 0 points 0 points 4 points  What month? 0 points 0 points 0 points 0 points  What time? 0 points 0 points 0 points 0 points  Count back from 20 0 points 0 points 0 points 0 points  Months in reverse 0 points 0 points 0 points 0 points  Repeat phrase 0 points 0 points 2 points 2 points  Total Score 0 points 0 points 2 points 6 points    Immunizations Immunization History  Administered Date(s) Administered   Influenza,inj,Quad PF,6+ Mos 05/30/2019   Pneumococcal Conjugate-13 11/03/2013   Pneumococcal Polysaccharide-23 10/23/2016    TDAP status: Up to date  Flu Vaccine status: Declined, Education has been provided regarding the importance of this vaccine but patient still declined. Advised may receive this vaccine at local pharmacy or Health Dept. Aware to provide a copy of the vaccination record if obtained from local pharmacy or Health Dept. Verbalized acceptance and understanding.  Pneumococcal vaccine status: Up to date  Covid-19 vaccine status: Declined, Education has been provided regarding the importance of this vaccine but patient still declined. Advised may receive this vaccine at local pharmacy or Health Dept.or vaccine clinic. Aware to provide a copy of the vaccination record if obtained from local pharmacy or Health Dept. Verbalized acceptance and understanding.  Qualifies for Shingles Vaccine? Yes   Zostavax completed No   Shingrix  Completed?: No.    Education has been provided regarding the importance of this vaccine. Patient has been advised to call insurance company to determine out of pocket expense if they have not yet received this vaccine. Advised may also receive vaccine at local pharmacy or Health Dept. Verbalized acceptance and understanding.  Screening Tests Health Maintenance  Topic Date Due   DTaP/Tdap/Td (1 - Tdap) Never done   Zoster Vaccines- Shingrix (1 of 2) Never done   INFLUENZA VACCINE  03/04/2023   COVID-19 Vaccine (1 - 2023-24 season) Never done   Colonoscopy  04/23/2024   Medicare Annual Wellness (AWV)  05/02/2024   Pneumonia Vaccine 14+ Years old  Completed   DEXA SCAN  Completed   Hepatitis C Screening  Completed   HPV VACCINES  Aged Out    Health Maintenance  Health Maintenance Due  Topic Date Due   DTaP/Tdap/Td (1 - Tdap) Never done   Zoster Vaccines- Shingrix (1 of 2) Never done   INFLUENZA VACCINE  03/04/2023   COVID-19 Vaccine (1 - 2023-24 season) Never done    Colorectal cancer screening: No longer required.   Mammogram status: No longer required due to age.  Bone Density status: Completed yes. Results reflect: Bone density results: NORMAL. Repeat every yes  years.  Lung Cancer Screening: (Low Dose CT Chest recommended if Age 52-80 years, 20 pack-year currently smoking OR have quit w/in 15years.) does not qualify.   Lung Cancer Screening Referral:    Additional Screening:  Hepatitis C Screening: does not qualify; Completed yes  Vision Screening: Recommended annual ophthalmology exams for early detection of glaucoma and other disorders of the eye. Is the patient up to date with their annual eye exam?  Yes  Who is the provider or what is the name of the office in which the patient attends annual eye exams? Dr Brooke Dare If pt is not established with a provider, would they like to be referred to a provider to establish care? No .   Dental Screening: Recommended annual dental  exams for proper oral hygiene  Diabetic Foot Exam: n/a  Community Resource Referral / Chronic Care Management: CRR required this visit?  No   CCM required this visit?  No   Plan:     I have personally reviewed and noted the following in the patient's chart:   Medical and social history Use of alcohol, tobacco or illicit drugs  Current medications and supplements including opioid prescriptions. Patient is not currently taking opioid prescriptions. Functional ability and status Nutritional status Physical activity Advanced directives List of other physicians Hospitalizations, surgeries, and ER visits in previous 12 months Vitals Screenings to include cognitive, depression, and falls Referrals and appointments  In addition, I have reviewed and discussed with patient certain preventive protocols, quality metrics, and best practice recommendations. A written personalized care plan for preventive services as well as general preventive health recommendations were provided to patient.     Sue Lush, LPN   [id number]/[id number]   After Visit Summary: (MyChart) Due to this being a telephonic visit, the after visit summary with patients personalized plan was offered to patient via MyChart  ALSO PRINTED AND GIVEN TO PT  Nurse NotesPt states she has some urinary leaks and wears a pad for this. She says she wants to try kegel exercises as she has done before. Pt will try kegel exercises to see if strenghens pelvic floor muscles if not she will speak with PCP at PE appt (she will make when gets home).

## 2023-05-03 NOTE — Patient Instructions (Addendum)
Ms. Lakins , Thank you for taking time to come for your Medicare Wellness Visit. I appreciate your ongoing commitment to your health goals. Please review the following plan we discussed and let me know if I can assist you in the future.   Referrals/Orders/Follow-Ups/Clinician Recommendations: none  This is a list of the screening recommended for you and due dates:  Health Maintenance  Topic Date Due   DTaP/Tdap/Td vaccine (1 - Tdap) Never done   Zoster (Shingles) Vaccine (1 of 2) Never done   Flu Shot  03/04/2023   COVID-19 Vaccine (1 - 2023-24 season) Never done   Colon Cancer Screening  04/23/2024   Medicare Annual Wellness Visit  05/02/2024   Pneumonia Vaccine  Completed   DEXA scan (bone density measurement)  Completed   Hepatitis C Screening  Completed   HPV Vaccine  Aged Out    Advanced directives: (Declined) Advance directive discussed with you today. Even though you declined this today, please call our office should you change your mind, and we can give you the proper paperwork for you to fill out.  Next Medicare Annual Wellness Visit scheduled for next year: Yes 05/03/24 @ 10:55am telephone

## 2023-05-29 ENCOUNTER — Other Ambulatory Visit: Payer: Self-pay | Admitting: Family Medicine

## 2023-06-23 ENCOUNTER — Other Ambulatory Visit: Payer: Self-pay | Admitting: Family Medicine

## 2023-06-24 DIAGNOSIS — Z961 Presence of intraocular lens: Secondary | ICD-10-CM | POA: Diagnosis not present

## 2023-06-24 DIAGNOSIS — H401131 Primary open-angle glaucoma, bilateral, mild stage: Secondary | ICD-10-CM | POA: Diagnosis not present

## 2023-06-24 NOTE — Telephone Encounter (Signed)
Requested medications are due for refill today.  yes  Requested medications are on the active medications list.  yes  Last refill. 05/31/2023 #30 0 rf  Future visit scheduled.   no  Notes to clinic.  Expired labs.    Requested Prescriptions  Pending Prescriptions Disp Refills   lisinopril (ZESTRIL) 5 MG tablet [Pharmacy Med Name: LISINOPRIL 5 MG TABLET] 90 tablet 1    Sig: TAKE 1 TABLET BY MOUTH EVERY DAY     Cardiovascular:  ACE Inhibitors Failed - 06/23/2023  2:32 PM      Failed - Cr in normal range and within 180 days    Creatinine, Ser  Date Value Ref Range Status  12/08/2021 0.72 0.57 - 1.00 mg/dL Final         Failed - K in normal range and within 180 days    Potassium  Date Value Ref Range Status  12/08/2021 5.1 3.5 - 5.2 mmol/L Final         Passed - Patient is not pregnant      Passed - Last BP in normal range    BP Readings from Last 1 Encounters:  05/03/23 118/70         Passed - Valid encounter within last 6 months    Recent Outpatient Visits           3 months ago Otitis of right ear   [facility] Malva Limes, MD   6 months ago Urinary tract infection with hematuria, site unspecified   [facility] Malva Limes, MD   7 months ago Otalgia of right ear   [place] [facility] [place], [place], PA-C   10 months ago Acute cystitis without hematuria   [facility] Alfredia Ferguson, PA-C   1 year ago Primary hypertension   [facility] Malva Limes, MD

## 2023-06-27 ENCOUNTER — Other Ambulatory Visit: Payer: Self-pay | Admitting: Family Medicine

## 2023-07-13 ENCOUNTER — Other Ambulatory Visit: Payer: Self-pay | Admitting: Family Medicine

## 2023-07-13 DIAGNOSIS — J309 Allergic rhinitis, unspecified: Secondary | ICD-10-CM

## 2023-07-14 NOTE — Telephone Encounter (Signed)
Requested medication (s) are due for refill today- yes  Requested medication (s) are on the active medication list -yes  Future visit scheduled -no  Last refill: 05/31/23 #30- has notes  Notes to clinic: fails lab protocol- over 1 year-12/08/21  Requested Prescriptions  Pending Prescriptions Disp Refills   lisinopril (ZESTRIL) 5 MG tablet [Pharmacy Med Name: LISINOPRIL 5 MG TABLET] 30 tablet 0    Sig: TAKE 1 TABLET BY MOUTH EVERY DAY     Cardiovascular:  ACE Inhibitors Failed - 07/13/2023 12:31 PM      Failed - Cr in normal range and within 180 days    Creatinine, Ser  Date Value Ref Range Status  12/08/2021 0.72 0.57 - 1.00 mg/dL Final         Failed - K in normal range and within 180 days    Potassium  Date Value Ref Range Status  12/08/2021 5.1 3.5 - 5.2 mmol/L Final         Passed - Patient is not pregnant      Passed - Last BP in normal range    BP Readings from Last 1 Encounters:  05/03/23 118/70         Passed - Valid encounter within last 6 months    Recent Outpatient Visits           4 months ago Otitis of right ear   [place] [facility] Malva Limes, MD   6 months ago Urinary tract infection with hematuria, site unspecified   [facility], MD   8 months ago Otalgia of right ear   [place] [facility] [place], [place], [place]   11 months ago Acute cystitis without hematuria   [facility] Alfredia Ferguson, PA-C   1 year ago Primary hypertension   [place] [facility] Malva Limes, MD              Signed Prescriptions Disp Refills   fluticasone (FLONASE) 50 MCG/ACT nasal spray 48 mL 1    Sig: PLACE 2 SPRAYS INTO BOTH NOSTRILS DAILY AS NEEDED. 2(TWO) NASAL EACH NOSTRIL ONCE A DAY AS NEEDED     Ear, Nose, and Throat: Nasal Preparations - Corticosteroids Passed - 07/13/2023 12:31 PM      Passed - Valid encounter within last 12  months    Recent Outpatient Visits           4 months ago Otitis of right ear   [place] [facility] Malva Limes, MD   6 months ago Urinary tract infection with hematuria, site unspecified   [facility] Malva Limes, MD   8 months ago Otalgia of right ear   [place] [facility] [place], [place], [place]   11 months ago Acute cystitis without hematuria   [place] [facility] Alfred[facility] Ferguson, PA-C   1 year ago Primary hypertension    [facility]va [facility], [facility]                 Requested Prescriptions  Pending Prescriptions Disp Refills   lisinopril (ZESTRIL) 5 MG tablet [Pharmacy Med Name: LISINOPRIL 5 MG TABLET] 30 tablet 0    Sig: TAKE 1 TABLET BY MOUTH EVERY DAY     Cardiovascular:  ACE Inhibitors Failed - 07/13/2023 12:31 PM      Failed - Cr in normal range and within 180  days    Creatinine, Ser  Date Value Ref Range Status  12/08/2021 0.72 0.57 - 1.00 mg/dL Final         Failed - K in normal range and within 180 days    Potassium  Date Value Ref Range Status  12/08/2021 5.1 3.5 - 5.2 mmol/L Final         Passed - Patient is not pregnant      Passed - Last BP in normal range    BP Readings from Last 1 Encounters:  05/03/23 118/70         Passed - Valid encounter within last 6 months    Recent Outpatient Visits           4 months ago Otitis of right ear   [place] [facility] Malva Limes, MD   6 months ago Urinary tract infection with hematuria, site unspecified   [facility] Malva L[facility], MD   8 months ago Otalgia of right ear   [place] [facility] [place], [place], [place]   11 months ago Acute cystitis without hematuria   [facility] Alfredia Ferguson, PA-C   1 year ago Primary hypertension   [place] [facility] Malva Limes, MD              Signed Prescriptions Disp Refills   fluticasone (FLONASE) 50 MCG/ACT nasal spray 48 mL 1    Sig: PLACE 2 SPRAYS INTO BOTH NOSTRILS DAILY AS NEEDED. 2(TWO) NASAL EACH NOSTRIL ONCE A DAY AS NEEDED     Ear, Nose, and Throat: Nasal Preparations - Corticosteroids Passed - 07/13/2023 12:31 PM      Passed - Valid encounter within last 12 months    Recent Outpatient Visits           4 months ago Otitis of right ear   [place] Malva Limes, MD   6 months ago Urinary tract infection with hematuria, site unspecified   [facility] Malva Limes, MD   8 months ago Otalgia of right ear   [place] [place], [place], [place]   11 months ago Acute cystitis without hematuria   [facility] Alfred[place] Ferguson, PA-C   1 year ago Primary hypertension    [facility] Malva Limes, MD

## 2023-07-14 NOTE — Telephone Encounter (Signed)
Requested Prescriptions  Pending Prescriptions Disp Refills   lisinopril (ZESTRIL) 5 MG tablet [Pharmacy Med Name: LISINOPRIL 5 MG TABLET] 30 tablet 0    Sig: TAKE 1 TABLET BY MOUTH EVERY DAY     Cardiovascular:  ACE Inhibitors Failed - 07/13/2023 12:31 PM      Failed - Cr in normal range and within 180 days    Creatinine, Ser  Date Value Ref Range Status  12/08/2021 0.72 0.57 - 1.00 mg/dL Final         Failed - K in normal range and within 180 days    Potassium  Date Value Ref Range Status  12/08/2021 5.1 3.5 - 5.2 mmol/L Final         Passed - Patient is not pregnant      Passed - Last BP in normal range    BP Readings from Last 1 Encounters:  05/03/23 118/70         Passed - Valid encounter within last 6 months    Recent Outpatient Visits           4 months ago Otitis of right ear   [facility] Malva Limes, MD   6 months ago Urinary tract infection with hematuria, site unspecified   [facility] Malva Limes, MD   8 months ago Otalgia of right ear   [facility] [place], [place], [place]   11 months ago Acute cystitis without hematuria   [facility] Alfredia Ferguson, PA-C   1 year ago Primary hypertension   [facility] Malva Limes, MD               fluticasone (FLONASE) 50 MCG/ACT nasal spray [Pharmacy Med Name: FLUTICASONE PROP 50 MCG SPRAY] 48 mL 1    Sig: PLACE 2 SPRAYS INTO BOTH NOSTRILS DAILY AS NEEDED. 2(TWO) NASAL EACH NOSTRIL ONCE A DAY AS NEEDED     Ear, Nose, and Throat: Nasal Preparations - Corticosteroids Passed - 07/13/2023 12:31 PM      Passed - Valid encounter within last 12 months    Recent Outpatient Visits           4 months ago Otitis of right ear   [facility] L[facility], [facility]   6 months ago Urinary tract infection with hematuria, site unspecified   [facility] Mal[facility] Limes, MD   8 months ago Otalgia of right ear   [facility] [place], [place], [place]   11 months ago Acute cystitis without hematuria   [facility] Alfredia Ferguson, PA-C   1 year ago Primary hypertension   [facility] Malva Limes, MD

## 2023-07-22 ENCOUNTER — Ambulatory Visit: Payer: Self-pay

## 2023-07-22 DIAGNOSIS — I10 Essential (primary) hypertension: Secondary | ICD-10-CM

## 2023-07-22 MED ORDER — AMLODIPINE BESYLATE 5 MG PO TABS: 5.0000 mg | ORAL_TABLET | Freq: Every day | ORAL | 1 refills | Status: DC

## 2023-07-22 NOTE — Addendum Note (Signed)
Addended by: Malva Limes on: 07/22/2023 04:40 PM   Modules accepted: Orders

## 2023-07-22 NOTE — Telephone Encounter (Signed)
  Chief Complaint: medication reaction  Symptoms: mouth and tongue sore, throat itching and scratchy Frequency: going on a while been on medication long time  Pertinent Negatives: Patient denies any throat or mouth swelling  Disposition: [] ED /[] Urgent Care (no appt availability in office) / [] Appointment(In office/virtual)/ []  [place] [facility]/ [] Home Care/ [] Refused Recommended Disposition /[] [place] [facility]/ [x]  Follow-up with PCP Additional Notes: pt reported above sx, states her niece was on the same thing and had same sx and switched to different medication and sx resolved. Pt asking if she can be prescribed something different to help with sx. Advised I would send message to provider for review. Pt verbalized understanding.   Summary: Possible reaction to medication   Pt would like to discuss the lisinopril (ZESTRIL) 5 MG tablet with provider. Pt states she has noticed her mouth and tongue will get sore after taking the lisinopril, pt has noticed sometimes her throat will get itchy as well.  Pt seeking clinical advice         Reason for Disposition  [1] Caller has URGENT medicine question about med that PCP or specialist prescribed AND [2] triager unable to answer question  Answer Assessment - Initial Assessment Questions 1. NAME of MEDICINE: "What medicine(s) are you calling about?"     Lisinopril  2. QUESTION: "What is your question?" (e.g., double dose of medicine, side effect)     Wanting to see if something different to take  3. PRESCRIBER: "Who prescribed the medicine?" Reason: if prescribed by specialist, call should be referred to that group.     Dr. Sherrie Mustache  4. SYMPTOMS: "Do you have any symptoms?" If Yes, ask: "What symptoms are you having?"  "How bad are the symptoms (e.g., mild, moderate, severe)     Throat gets itchy and scratching, mouth and tongue sore  Protocols used: Medication Question Call-A-AH

## 2023-07-22 NOTE — Telephone Encounter (Signed)
Have sent prescription for amlodipine to her pharmacy. Please schedule follow up to check on bp in 1 month.

## 2023-07-23 NOTE — Telephone Encounter (Signed)
Patient notified and verbalized understanding. We scheduled a follow up on 08/27/23 at 8:20 am with Dr. Sherrie Mustache

## 2023-07-26 ENCOUNTER — Telehealth: Payer: Self-pay | Admitting: Family Medicine

## 2023-07-26 DIAGNOSIS — I10 Essential (primary) hypertension: Secondary | ICD-10-CM

## 2023-07-26 MED ORDER — AMLODIPINE BESYLATE 5 MG PO TABS: 5.0000 mg | ORAL_TABLET | Freq: Every day | ORAL | 1 refills | Status: DC

## 2023-07-26 NOTE — Telephone Encounter (Signed)
Prescription has been sent in.  Disp Refills Start End   amLODipine (NORVASC) 5 MG tablet 30 tablet 1 07/26/2023 --   Sig - Route: Take 1 tablet (5 mg total) by mouth daily. - Oral   Sent to pharmacy as: amLODipine (NORVASC) 5 MG tablet   E-Prescribing Status: Receipt confirmed by pharmacy (07/26/2023 12:36 PM EST)

## 2023-07-26 NOTE — Telephone Encounter (Signed)
Patient stated that her medication amLODipine (NORVASC) 5 MG tablet  has not been called into her pharmacy and she needs it.      Cosign Tracking Order Transmittal Tracking   Outpatient Medication Detail   Disp Refills Start End   amLODipine (NORVASC) 5 MG tablet 30 tablet 1 07/22/2023 --   Sig - Route: Take 1 tablet (5 mg total) by mouth daily. - Oral   Class: Phone In     No Receipt confirmation from pharmacy

## 2023-08-12 ENCOUNTER — Other Ambulatory Visit: Payer: Self-pay | Admitting: Family Medicine

## 2023-08-17 ENCOUNTER — Other Ambulatory Visit: Payer: Self-pay | Admitting: Family Medicine

## 2023-08-17 DIAGNOSIS — I10 Essential (primary) hypertension: Secondary | ICD-10-CM

## 2023-08-18 NOTE — Telephone Encounter (Signed)
 Requested medication (s) are due for refill today: yes  Requested medication (s) are on the active medication list: yes  Last refill:  07/26/23 #30 1 refills  Future visit scheduled: yes in 1 week  Notes to clinic:    Pharmacy comment: REQUEST FOR 90 DAYS PRESCRIPTION. DX Code Needed.         Requested Prescriptions  Pending Prescriptions Disp Refills   amLODipine  (NORVASC ) 5 MG tablet [Pharmacy Med Name: AMLODIPINE  BESYLATE 5 MG TAB] 90 tablet 1    Sig: Take 1 tablet (5 mg total) by mouth daily.     Cardiovascular: Calcium Channel Blockers 2 Passed - 08/18/2023 12:20 PM      Passed - Last BP in normal range    BP Readings from Last 1 Encounters:  05/03/23 118/70         Passed - Last Heart Rate in normal range    Pulse Readings from Last 1 Encounters:  03/05/23 70         Passed - Valid encounter within last 6 months    Recent Outpatient Visits           5 months ago Otitis of right ear   [facility] Lamon Pillow, [facility]   8 months ago Urinary tract infection with hematuria, site unspecified   [facility] Lamon Pillow, MD   9 months ago Otalgia of right ear   [facility] [place], [place], PA-C   1 year ago Acute cystitis without hematuria   [facility] [place] Frock, PA-C   1 year ago Primary hypertension   [facility] Lamon Pillow, MD       Future Appointments             In 1 week Fisher, Erlinda Haws, MD [facility]

## 2023-08-26 ENCOUNTER — Other Ambulatory Visit: Payer: Self-pay | Admitting: Family Medicine

## 2023-08-27 ENCOUNTER — Ambulatory Visit (INDEPENDENT_AMBULATORY_CARE_PROVIDER_SITE_OTHER): Payer: Medicare Other | Admitting: Family Medicine

## 2023-08-27 VITALS — BP 139/83 | HR 67 | Resp 18 | Ht 61.0 in | Wt 142.3 lb

## 2023-08-27 DIAGNOSIS — I10 Essential (primary) hypertension: Secondary | ICD-10-CM

## 2023-08-27 DIAGNOSIS — J309 Allergic rhinitis, unspecified: Secondary | ICD-10-CM

## 2023-08-27 DIAGNOSIS — E881 Lipodystrophy, not elsewhere classified: Secondary | ICD-10-CM

## 2023-08-27 DIAGNOSIS — E2839 Other primary ovarian failure: Secondary | ICD-10-CM

## 2023-08-27 NOTE — Progress Notes (Signed)
Established patient visit   Patient: Deanna Weaver   DOB: [birth year]   78 y.o. Female  MRN: [id number] Visit Date: 08/27/2023  Today's healthcare provider: Mila Merry, MD   Chief Complaint  Patient presents with   Hypertension   Hyperlipidemia   Subjective    Discussed the use of AI scribe software for clinical note transcription with the patient, who gave verbal consent to proceed.  History of Present Illness   The patient, with a history of hypertension and hyperlipidemia, reports good control of blood pressure at home, with readings averaging 130s/60s. They have been adherent to their prescribed medications, including amlodipine for hypertension and simvastatin for hyperlipidemia. They deny any cardiac symptoms such as chest pain or shortness of breath.  They also report taking fish oil and Singulair daily, the latter being particularly useful currently. They have been prescribed meloxicam for arthritis, but they only take it as needed, particularly for neck pain due to arthritis. They report that they can tolerate the pain and have not needed to refill the prescription for a while.       Medications: Outpatient Medications Prior to Visit  Medication Sig   amLODipine (NORVASC) 5 MG tablet Take 1 tablet (5 mg total) by mouth daily.   calcium carbonate (OS-CAL - DOSED IN MG OF ELEMENTAL CALCIUM) 1250 (500 Ca) MG tablet Take 1 tablet by mouth daily.   CRANBERRY PO Take 1 tablet by mouth 2 (two) times daily.   fluticasone (FLONASE) 50 MCG/ACT nasal spray PLACE 2 SPRAYS INTO BOTH NOSTRILS DAILY AS NEEDED. 2(TWO) NASAL EACH NOSTRIL ONCE A DAY AS NEEDED   latanoprost (XALATAN) 0.005 % ophthalmic solution Place 1 drop into both eyes at bedtime.   meloxicam (MOBIC) 7.5 MG tablet Take 1 tablet (7.5 mg total) by mouth daily. (Patient taking differently: Take 7.5 mg by mouth daily as needed for pain.)   montelukast (SINGULAIR) 10 MG tablet TAKE 1 TABLET (10 MG TOTAL) BY MOUTH DAILY.  FOR ALLERGIES   Omega-3 Fatty Acids (FISH OIL) 1000 MG CAPS Take 2 capsules by mouth daily.   omeprazole (PRILOSEC) 40 MG capsule TAKE 1 CAPSULE (40 MG TOTAL) BY MOUTH DAILY.   simvastatin (ZOCOR) 40 MG tablet TAKE 1 TABLET BY MOUTH EVERYDAY AT BEDTIME   No facility-administered medications prior to visit.      Objective    BP 139/83   Pulse 67   Resp 18   Ht 5\' 1"  (1.549 m)   Wt 142 lb 4.8 oz (64.5 kg)   SpO2 95%   BMI 26.89 kg/m   Physical Exam   General: Appearance:    Well developed, well nourished female in no acute distress  Eyes:    PERRL, conjunctiva/corneas clear, EOM's intact       Lungs:     Clear to auscultation bilaterally, respirations unlabored  Heart:    Normal heart rate. Normal rhythm. No murmurs, rubs, or gallops.    MS:   All extremities are intact.    Neurologic:   Awake, alert, oriented x 3. No apparent focal neurological defect.         Assessment & Plan        Hypertension Well controlled on Amlodipine with home readings around 130s/60s. -Continue Amlodipine.  Hyperlipidemia No reported issues. -Continue Simvastatin. -check lipids, met c, CBC  Allergic rhinitis No reported issues. -Continue Singulair and Fish oil.  Arthritis Occasional neck pain, not taking Meloxicam regularly. -Continue Meloxicam as needed for pain.  Osteoporosis screening Last bone density scan was over 5 years ago. -Order bone density scan.  General Health Maintenance -Order routine blood work. -Continue annual mammograms.         Mila Merry, MD  [facility] [phone] (phone) [phone] (fax)  [facility]

## 2023-08-28 LAB — CBC
Hematocrit: 45 % (ref 34.0–46.6)
Hemoglobin: 15.2 g/dL (ref 11.1–15.9)
MCH: 31.7 pg (ref 26.6–33.0)
MCHC: 33.8 g/dL (ref 31.5–35.7)
MCV: 94 fL (ref 79–97)
Platelets: 207 10*3/uL (ref 150–450)
RBC: 4.79 x10E6/uL (ref 3.77–5.28)
RDW: 11.8 % (ref 11.7–15.4)
WBC: 7.2 10*3/uL (ref 3.4–10.8)

## 2023-08-28 LAB — COMPREHENSIVE METABOLIC PANEL
ALT: 18 [IU]/L (ref 0–32)
AST: 21 [IU]/L (ref 0–40)
Albumin: 4.8 g/dL (ref 3.8–4.8)
Alkaline Phosphatase: 81 [IU]/L (ref 44–121)
BUN/Creatinine Ratio: 18 (ref 12–28)
BUN: 16 mg/dL (ref 8–27)
Bilirubin Total: 0.5 mg/dL (ref 0.0–1.2)
CO2: 26 mmol/L (ref 20–29)
Calcium: 10.2 mg/dL (ref 8.7–10.3)
Chloride: 101 mmol/L (ref 96–106)
Creatinine, Ser: 0.91 mg/dL (ref 0.57–1.00)
Globulin, Total: 2.4 g/dL (ref 1.5–4.5)
Glucose: 100 mg/dL — ABNORMAL HIGH (ref 70–99)
Potassium: 5 mmol/L (ref 3.5–5.2)
Sodium: 142 mmol/L (ref 134–144)
Total Protein: 7.2 g/dL (ref 6.0–8.5)
eGFR: 65 mL/min/{1.73_m2} (ref >59)

## 2023-08-28 LAB — LIPID PANEL
Chol/HDL Ratio: 4.3 {ratio} (ref 0.0–4.4)
Cholesterol, Total: 150 mg/dL (ref 100–199)
HDL: 35 mg/dL — ABNORMAL LOW (ref >39)
LDL Chol Calc (NIH): 83 mg/dL (ref 0–99)
Triglycerides: 187 mg/dL — ABNORMAL HIGH (ref 0–149)
VLDL Cholesterol Cal: 32 mg/dL (ref 5–40)

## 2023-08-29 ENCOUNTER — Encounter: Payer: Self-pay | Admitting: Family Medicine

## 2023-09-06 DIAGNOSIS — D2261 Melanocytic nevi of right upper limb, including shoulder: Secondary | ICD-10-CM | POA: Diagnosis not present

## 2023-09-06 DIAGNOSIS — D2272 Melanocytic nevi of left lower limb, including hip: Secondary | ICD-10-CM | POA: Diagnosis not present

## 2023-09-06 DIAGNOSIS — L821 Other seborrheic keratosis: Secondary | ICD-10-CM | POA: Diagnosis not present

## 2023-09-06 DIAGNOSIS — L820 Inflamed seborrheic keratosis: Secondary | ICD-10-CM | POA: Diagnosis not present

## 2023-09-06 DIAGNOSIS — L538 Other specified erythematous conditions: Secondary | ICD-10-CM | POA: Diagnosis not present

## 2023-09-06 DIAGNOSIS — D2271 Melanocytic nevi of right lower limb, including hip: Secondary | ICD-10-CM | POA: Diagnosis not present

## 2023-09-06 DIAGNOSIS — L089 Local infection of the skin and subcutaneous tissue, unspecified: Secondary | ICD-10-CM | POA: Diagnosis not present

## 2023-09-06 DIAGNOSIS — D225 Melanocytic nevi of trunk: Secondary | ICD-10-CM | POA: Diagnosis not present

## 2023-09-06 DIAGNOSIS — D2262 Melanocytic nevi of left upper limb, including shoulder: Secondary | ICD-10-CM | POA: Diagnosis not present

## 2023-09-08 ENCOUNTER — Ambulatory Visit
Admission: RE | Admit: 2023-09-08 | Discharge: 2023-09-08 | Disposition: A | Discharge status: Home / Self Care | Payer: Medicare Other | Source: Ambulatory Visit | Attending: Family Medicine | Admitting: Family Medicine

## 2023-09-08 ENCOUNTER — Encounter: Payer: Self-pay | Admitting: Family Medicine

## 2023-09-08 DIAGNOSIS — Z780 Asymptomatic menopausal state: Secondary | ICD-10-CM | POA: Diagnosis not present

## 2023-09-08 DIAGNOSIS — E2839 Other primary ovarian failure: Secondary | ICD-10-CM | POA: Diagnosis not present

## 2023-09-19 ENCOUNTER — Other Ambulatory Visit: Payer: Self-pay | Admitting: Family Medicine

## 2023-09-19 DIAGNOSIS — I10 Essential (primary) hypertension: Secondary | ICD-10-CM

## 2023-09-20 NOTE — Telephone Encounter (Signed)
Requested medications are due for refill today.  yes  Requested medications are on the active medications list.  yes  Last refill. 07/26/2023 330 1 rf  Future visit scheduled.   no  Notes to clinic.  New medication to this pt.    Requested Prescriptions  Pending Prescriptions Disp Refills   amLODipine (NORVASC) 5 MG tablet [Pharmacy Med Name: AMLODIPINE BESYLATE 5 MG TAB] 30 tablet 1    Sig: Take 1 tablet (5 mg total) by mouth daily.     Cardiovascular: Calcium Channel Blockers 2 Passed - 09/20/2023  3:35 PM      Passed - Last BP in normal range    BP Readings from Last 1 Encounters:  08/27/23 139/83         Passed - Last Heart Rate in normal range    Pulse Readings from Last 1 Encounters:  08/27/23 67         Passed - Valid encounter within last 6 months    Recent Outpatient Visits           3 weeks ago Primary hypertension   [facility] [facility] Mal[facility] L[facility], [facility]   6 months ago Otitis of right ear   [facility] [facility] Malva Limes, MD   9 months ago Urinary tract infection with hematuria, site unspecified   [facility] Malva Limes, MD   10 months ago Otalgia of right ear   [facility] [place], [place], [place]   1 year ago Acute cystitis without hematuria   [facility] Alfredia Ferguson, [place]

## 2023-09-21 ENCOUNTER — Encounter: Payer: Self-pay | Admitting: Family Medicine

## 2023-09-22 ENCOUNTER — Telehealth: Payer: Self-pay | Admitting: Family Medicine

## 2023-09-22 ENCOUNTER — Other Ambulatory Visit: Payer: Self-pay

## 2023-09-22 DIAGNOSIS — I10 Essential (primary) hypertension: Secondary | ICD-10-CM

## 2023-09-22 MED ORDER — AMLODIPINE BESYLATE 5 MG PO TABS: 5.0000 mg | ORAL_TABLET | Freq: Every day | ORAL | 1 refills | Status: DC

## 2023-09-22 NOTE — Telephone Encounter (Signed)
Copied from CRM [id number]. Topic: Clinical - Medication Refill >> Sep 22, 2023  9:16 AM Elmarie Shiley B wrote: Most Recent Primary Care Visit:  Provider: Malva Limes  Department: [id number]  Visit Type: OFFICE VISIT  Date: 08/27/2023  Medication: 90 day supply of amLODipine (NORVASC) 5 MG tablet. If any medications are due please send a 90 day supply new pharmacy.   Has the patient contacted their pharmacy? Yes, Contact PCP office. Caller would like Rx sent to the new pharmacy.    Is this the correct pharmacy for this prescription? No  This is the patient's preferred pharmacy: [facility]  [address] [place], [place] [address]     Has the prescription been filled recently? Yes  Is the patient out of the medication? No 3 pills left  Has the patient been seen for an appointment in the last year OR does the patient have an upcoming appointment? Yes  Can we respond through MyChart? Yes  Agent: Please be advised that Rx refills may take up to 3 business days. We ask that you follow-up with your pharmacy.

## 2023-09-22 NOTE — Telephone Encounter (Signed)
 Rx sent

## 2023-09-30 DIAGNOSIS — J3489 Other specified disorders of nose and nasal sinuses: Secondary | ICD-10-CM | POA: Diagnosis not present

## 2023-09-30 DIAGNOSIS — R0981 Nasal congestion: Secondary | ICD-10-CM | POA: Diagnosis not present

## 2023-09-30 DIAGNOSIS — N3001 Acute cystitis with hematuria: Secondary | ICD-10-CM | POA: Diagnosis not present

## 2023-09-30 DIAGNOSIS — R051 Acute cough: Secondary | ICD-10-CM | POA: Diagnosis not present

## 2023-09-30 DIAGNOSIS — U071 COVID-19: Secondary | ICD-10-CM | POA: Diagnosis not present

## 2023-09-30 DIAGNOSIS — R300 Dysuria: Secondary | ICD-10-CM | POA: Diagnosis not present

## 2023-10-14 ENCOUNTER — Other Ambulatory Visit: Payer: Self-pay | Admitting: Family Medicine

## 2023-10-14 DIAGNOSIS — I10 Essential (primary) hypertension: Secondary | ICD-10-CM

## 2023-10-15 NOTE — Telephone Encounter (Signed)
 Requested Prescriptions  Pending Prescriptions Disp Refills   amLODipine (NORVASC) 5 MG tablet [Pharmacy Med Name: AMLODIPINE BESYLATE 5 MG TAB] 90 tablet 0    Sig: TAKE 1 TABLET (5 MG TOTAL) BY MOUTH DAILY.     Cardiovascular: Calcium Channel Blockers 2 Passed - 10/15/2023  9:38 AM      Passed - Last BP in normal range    BP Readings from Last 1 Encounters:  08/27/23 139/83         Passed - Last Heart Rate in normal range    Pulse Readings from Last 1 Encounters:  08/27/23 67         Passed - Valid encounter within last 6 months    Recent Outpatient Visits           1 month ago Primary hypertension   [facility] Mal[facility] L[facility], [facility]   7 months ago Otitis of right ear   [facility] Malva L[facility], MD   9 months ago Urinary tract infection with hematuria, site unspecified   [facility] Limes, MD   11 months ago Otalgia of right ear   [facility] [place], [place], [place]   1 year ago Acute cystitis without hematuria   [facility] Alfredia Ferguson, [place]

## 2023-10-18 DIAGNOSIS — H6501 Acute serous otitis media, right ear: Secondary | ICD-10-CM | POA: Diagnosis not present

## 2023-10-19 ENCOUNTER — Emergency Department
Admission: EM | Admit: 2023-10-19 | Discharge: 2023-10-20 | Disposition: A | Discharge status: Home / Self Care | Attending: Emergency Medicine | Admitting: Emergency Medicine

## 2023-10-19 ENCOUNTER — Ambulatory Visit: Payer: Self-pay | Admitting: Family Medicine

## 2023-10-19 ENCOUNTER — Other Ambulatory Visit: Payer: Self-pay

## 2023-10-19 ENCOUNTER — Encounter: Payer: Self-pay | Admitting: *Deleted

## 2023-10-19 ENCOUNTER — Emergency Department

## 2023-10-19 DIAGNOSIS — G509 Disorder of trigeminal nerve, unspecified: Secondary | ICD-10-CM | POA: Insufficient documentation

## 2023-10-19 DIAGNOSIS — R519 Headache, unspecified: Secondary | ICD-10-CM | POA: Diagnosis not present

## 2023-10-19 DIAGNOSIS — I10 Essential (primary) hypertension: Secondary | ICD-10-CM | POA: Insufficient documentation

## 2023-10-19 DIAGNOSIS — G500 Trigeminal neuralgia: Secondary | ICD-10-CM | POA: Diagnosis not present

## 2023-10-19 DIAGNOSIS — H9201 Otalgia, right ear: Secondary | ICD-10-CM | POA: Diagnosis not present

## 2023-10-19 MED ORDER — ONDANSETRON 4 MG PO TBDP
4.0000 mg | ORAL_TABLET | Freq: Once | ORAL | Status: AC
  Administered 2023-10-19: 4 mg via ORAL
  Filled 2023-10-19: qty 1

## 2023-10-19 MED ORDER — HYDROCODONE-ACETAMINOPHEN 5-325 MG PO TABS
1.0000 | ORAL_TABLET | Freq: Once | ORAL | Status: AC
  Administered 2023-10-19: 1 via ORAL
  Filled 2023-10-19: qty 1

## 2023-10-19 NOTE — Telephone Encounter (Signed)
  Chief Complaint: earache Symptoms: pain   Disposition: [] ED /[] Urgent Care (no appt availability in office) / [x] Appointment(In office/virtual)/ []  [place] [facility]/ [] Home Care/ [] Refused Recommended Disposition /[] [place] [facility]/ []  Follow-up with PCP Additional Notes: Pt complaining of right earache since Saturday. Pt went to UC yesterday and was given Doxycycline. Pt has had 3 does so far and states the pain is unbearable. OTC pain meds are helping. Pt is asking if pain medication or drops could be called in for relief until pt can see PCP tomorrow at 0920. RN gave some care advice and pt verbalized understanding.             Copied from CRM [id number]. Topic: Clinical - Red Word Triage >> Oct 19, 2023  1:24 PM Franchot Heidelberg wrote: Red Word that prompted transfer to Nurse Triage: Severe pain in ear Reason for Disposition  Earache  (Exceptions: brief ear pain of < 60 minutes duration, earache occurring during air travel  Answer Assessment - Initial Assessment Questions 1. LOCATION: "Which ear is involved?"     Right  2. ONSET: "When did the ear start hurting"      Saturday 3. SEVERITY: "How bad is the pain?"  (Scale 1-10; mild, moderate or severe)   - MILD (1-3): doesn't interfere with normal activities    - MODERATE (4-7): interferes with normal activities or awakens from sleep    - SEVERE (8-10): excruciating pain, unable to do any normal activities      Severe  4. URI SYMPTOMS: "Do you have a runny nose or cough?"     Little cough  5. FEVER: "Do you have a fever?" If Yes, ask: "What is your temperature, how was it measured, and when did it start?"     Not sure  6. CAUSE: "Have you been swimming recently?", "How often do you use Q-TIPS?", "Have you had any recent air travel or scuba diving?"     na 7. OTHER SYMPTOMS: "Do you have any other symptoms?" (e.g., headache, stiff neck, dizziness, vomiting, runny nose, decreased hearing)     Denies  Protocols used:  Earache-A-AH

## 2023-10-19 NOTE — Telephone Encounter (Signed)
 Called and spoke to the pt, per Caryl Asp she needs to be reevaluated if she is still experiencing the ear pain after being treated, she has been advised to reach back out to Urgent care to requested the drops. She verbally stated she would just hold off until she see Dr Sherrie Mustache in the morning.

## 2023-10-19 NOTE — ED Triage Notes (Signed)
 Pt has right ear pain for 3 days.  Pt taking abx since yesterday.  Pt reports pain in worse.  Pt alert  speech clear.  Pt took 3 motrin at 1900.

## 2023-10-19 NOTE — ED Provider Notes (Signed)
 [facility] Emergency Department Provider Note     Event Date/Time   First MD Initiated Contact with Patient 10/19/23 2211     (approximate)   History   Otalgia   HPI  Deanna Weaver is a 78 y.o. female with a history of HLD, GERD, HTN, and sinusitis, presents to the ED endorsing increasing and worsening right ear and facial tenderness.  Patient presented to a local urgent care yesterday, was treated with doxycycline for presumed AOM on the right.  Chart review reveals that the patient had mildly erythematous TM without evidence of bulging, or purulent effusion.  Patient was noted on antibiotic due to her multiple antibiotic allergies.  She presents to the ED today, noting increasing pain including sharp pain intermittently to the temple region.  She denies any vision loss, tinnitus, vertigo, paralysis, or weakness.  She denies any recent fevers, chills, or sweats.  Patient did have a diagnosis of COVID with a concurrent UTI about 3 weeks prior.   Physical Exam   Triage Vital Signs: ED Triage Vitals  Encounter Vitals Group     BP 10/19/23 2142 (!) 156/81     Systolic BP Percentile --      Diastolic BP Percentile --      Pulse Rate 10/19/23 2142 71     Resp 10/19/23 2142 20     Temp 10/19/23 2142 98.4 F (36.9 C)     Temp Source 10/19/23 2142 Oral     SpO2 10/19/23 2142 97 %     Weight 10/19/23 2139 144 lb (65.3 kg)     Height 10/19/23 2139 5\' 1"  (1.549 m)     Head Circumference --      Peak Flow --      Pain Score 10/19/23 2139 10     Pain Loc --      Pain Education --      Exclude from Growth Chart --     Most recent vital signs: Vitals:   10/19/23 2142  BP: (!) 156/81  Pulse: 71  Resp: 20  Temp: 98.4 F (36.9 C)  SpO2: 97%    General Awake, no distress. NAD HEENT NCAT. PERRL. EOMI. No rhinorrhea. Mucous membranes are moist.  TMs intact bilaterally without erythema, purulent or serous effusions. CV:  Good peripheral perfusion.  RRR RESP:  Normal effort. CTA ABD:  No distention.  NEURO: Cranial nerves II to XII grossly intact.  Normal gross sensation to the 3 branches of the trigeminal   ED Results / Procedures / Treatments   Labs (all labs ordered are listed, but only abnormal results are displayed) Labs Reviewed - No data to display   EKG   RADIOLOGY  I personally viewed and evaluated these images as part of my medical decision making, as well as reviewing the written report by the radiologist.  ED Provider Interpretation: no acute findings  CT HEAD WO CONTRAST ( ) Result Date: 10/20/2023 CLINICAL DATA:  Headache EXAM: CT HEAD WITHOUT CONTRAST TECHNIQUE: Contiguous axial images were obtained from the base of the skull through the vertex without intravenous contrast. RADIATION DOSE REDUCTION: This exam was performed according to the departmental dose-optimization program which includes automated exposure control, adjustment of the mA and/or kV according to patient size and/or use of iterative reconstruction technique. COMPARISON:  01/24/2007 FINDINGS: Brain: No acute intracranial abnormality. Specifically, no hemorrhage, hydrocephalus, mass lesion, acute infarction, or significant intracranial injury. 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 Vascular: No hyperdense  vessel or unexpected calcification. Skull: No acute calvarial abnormality. Sinuses/Orbits: No acute findings Other: None IMPRESSION: No acute intracranial abnormality. Electronically Signed   By: Charlett Nose M.D.   On: 10/20/2023 00:14     PROCEDURES:  Critical Care performed: No  Procedures   MEDICATIONS ORDERED IN ED: Medications  baclofen (LIORESAL) tablet 10 mg (has no administration in time range)  ondansetron (ZOFRAN-ODT) disintegrating tablet 4 mg (4 mg Oral Given 10/19/23 2303)  HYDROcodone-acetaminophen (NORCO/VICODIN) 5-325 MG per tablet 1 tablet (1 tablet Oral Given 10/19/23 2303)     IMPRESSION / MDM /  ASSESSMENT AND PLAN / ED COURSE  I reviewed the triage vital signs and the nursing notes.                              Differential diagnosis includes, but is not limited to, intracranial hemorrhage, meningitis/encephalitis, previous head trauma, cavernous venous thrombosis, tension headache, temporal arteritis, migraine or migraine equivalent, idiopathic intracranial hypertension, and non-specific headache.  Patient's presentation is most consistent with acute presentation with potential threat to life or bodily function.  Patient's diagnosis is consistent with intermittent nerve pain in the trigeminal branch of the temple and otalgia on the right. Normal CT interpreted by me. No consistent symptoms to absolutely confirm trigeminal neuralgia versus prodrome of shingles, or other etiology. Patient will be discharged home with prescriptions for prednisone taper and baclofen. Patient is to follow up with her PCP as scheduled, as needed or otherwise directed. Patient is given ED precautions to return to the ED for any worsening or new symptoms.   FINAL CLINICAL IMPRESSION(S) / ED DIAGNOSES   Final diagnoses:  Otalgia of right ear  Sensory disorder of trigeminal nerve     Rx / DC Orders   ED Discharge Orders          Ordered    predniSONE (DELTASONE) 20 MG tablet  Q breakfast        10/20/23 0024    baclofen (LIORESAL) 10 MG tablet  3 times daily PRN        10/20/23 0024    HYDROcodone-acetaminophen (NORCO/VICODIN) 5-325 MG tablet  3 times daily PRN        10/20/23 0024             Note:  This document was prepared using Dragon voice recognition software and may include unintentional dictation errors.    Lissa Hoard, PA-C 10/20/23 0038    Chesley Noon, MD 10/21/23 [phone]

## 2023-10-20 ENCOUNTER — Ambulatory Visit (INDEPENDENT_AMBULATORY_CARE_PROVIDER_SITE_OTHER): Admitting: Family Medicine

## 2023-10-20 VITALS — BP 160/83 | HR 65 | Temp 98.0°F | Ht 61.0 in | Wt 144.0 lb

## 2023-10-20 DIAGNOSIS — G509 Disorder of trigeminal nerve, unspecified: Secondary | ICD-10-CM | POA: Diagnosis not present

## 2023-10-20 DIAGNOSIS — H9202 Otalgia, left ear: Secondary | ICD-10-CM | POA: Diagnosis not present

## 2023-10-20 MED ORDER — BACLOFEN 10 MG PO TABS: 10.0000 mg | ORAL_TABLET | Freq: Three times a day (TID) | ORAL | 0 refills | Status: AC | PRN

## 2023-10-20 MED ORDER — NEOMYCIN-POLYMYXIN-HC 3.5-10000-1 OT SOLN: 3.0000 [drp] | Freq: Four times a day (QID) | OTIC | 0 refills | Status: AC

## 2023-10-20 MED ORDER — VALACYCLOVIR HCL 1 G PO TABS: 1000.0000 mg | ORAL_TABLET | Freq: Three times a day (TID) | ORAL | 0 refills | Status: AC

## 2023-10-20 MED ORDER — BACLOFEN 10 MG PO TABS
10.0000 mg | ORAL_TABLET | Freq: Once | ORAL | Status: AC
  Administered 2023-10-20: 10 mg via ORAL
  Filled 2023-10-20: qty 1

## 2023-10-20 MED ORDER — HYDROCODONE-ACETAMINOPHEN 5-325 MG PO TABS
1.0000 | ORAL_TABLET | Freq: Three times a day (TID) | ORAL | 0 refills | Status: AC | PRN
Start: 2023-10-20 — End: 2023-10-23

## 2023-10-20 MED ORDER — PREDNISONE 20 MG PO TABS: ORAL_TABLET | ORAL | 0 refills | Status: AC

## 2023-10-20 NOTE — Discharge Instructions (Addendum)
 Take the prescription meds as directed.  Take the Norco up to 3 times a day, as needed. Take the Baclofen (muscle relaxant) up to 3 times a day, as needed. Take the steroid over the next 10 days, as prescribed. Take OTC Tylenol and Motrin as needed. Follow-up with your PCP as scheduled. Return to the ED as needed.

## 2023-10-20 NOTE — Progress Notes (Signed)
 Established patient visit   Patient: Deanna Weaver   DOB: Jan 23, 1946   78 y.o. Female  MRN: [id number] Visit Date: 10/20/2023  Today's healthcare provider: Mila Merry, MD   Chief Complaint  Patient presents with   ER follow up   Ear Pain    Patient began having ear pain 5 days ago.  She states it got bad enough on 3/17 that she went to the urgent care.  They started her on antibiotic.  She continued with worsening pain and went to the ER last night.  They did a CT scan which she said was normal. Patient said they told her they did not see anything wrong.  She has not noticed any lumps, bumps, blisters, rash, numbness, distortion, or discoloration around the ear, neck or in her hair. Patient said that she did have swelling on the right side   Subjective    Discussed the use of AI scribe software for clinical note transcription with the patient, who gave verbal consent to proceed.  History of Present Illness   Deanna Weaver is a 78 year old female who presents with right sided ear and facial pain. She is accompanied by her daughter.  She has been experiencing severe ear pain that began constant shooting pain for the last week and has since radiated up the side of her face. The pain is severe, constant, and worsening over time. She visited urgent care on Monday and was prescribed doxycycline, which she has been taking twice daily. Despite this, she continues to experience significant pain.  last night, she went to the ER where a CT scan was performed, but it did not reveal any significant findings. She was informed that the pain might be nerve-related. She was prescribed prednisone, which she has not yet started. She notes swelling under her ear. She has not used Q-tips in her ears and denies any prior history of shingles. She has taken ibuprofen for pain relief, which has helped manage the pain temporarily. She experiences shooting pain when turning her head and has had some dizziness.   She was prescribed baclofen, prednisone and hydrocodone apap at the ER last night. has not started the prednisone but did take a pain fill this morning and pain has improved.   She mentions having had a root canal a few months ago, but it is unclear if this is related to her current symptoms. She is concerned about the possibility of shingles, especially since she has a granddaughter's baby shower to attend. No rash is present, and both ears were clear during her hospital visit.       Medications: Outpatient Medications Prior to Visit  Medication Sig   amLODipine (NORVASC) 5 MG tablet TAKE 1 TABLET (5 MG TOTAL) BY MOUTH DAILY.   baclofen (LIORESAL) 10 MG tablet Take 1 tablet (10 mg total) by mouth 3 (three) times daily as needed for up to 10 days for muscle spasms.   calcium carbonate (OS-CAL - DOSED IN MG OF ELEMENTAL CALCIUM) 1250 (500 Ca) MG tablet Take 1 tablet by mouth daily.   CRANBERRY PO Take 1 tablet by mouth 2 (two) times daily.   fluticasone (FLONASE) 50 MCG/ACT nasal spray PLACE 2 SPRAYS INTO BOTH NOSTRILS DAILY AS NEEDED. 2(TWO) NASAL EACH NOSTRIL ONCE A DAY AS NEEDED   HYDROcodone-acetaminophen (NORCO/VICODIN) 5-325 MG tablet Take 1 tablet by mouth 3 (three) times daily as needed for up to 3 days for moderate pain (pain score 4-6).  latanoprost (XALATAN) 0.005 % ophthalmic solution Place 1 drop into both eyes at bedtime.   meloxicam (MOBIC) 7.5 MG tablet Take 1 tablet (7.5 mg total) by mouth daily. (Patient taking differently: Take 7.5 mg by mouth daily as needed for pain.)   montelukast (SINGULAIR) 10 MG tablet TAKE 1 TABLET (10 MG TOTAL) BY MOUTH DAILY. FOR ALLERGIES   Omega-3 Fatty Acids (FISH OIL) 1000 MG CAPS Take 2 capsules by mouth daily.   omeprazole (PRILOSEC) 40 MG capsule TAKE 1 CAPSULE (40 MG TOTAL) BY MOUTH DAILY.   predniSONE (DELTASONE) 20 MG tablet Take 2 tablets (40 mg total) by mouth daily with breakfast for 2 days, THEN 1.5 tablets (30 mg total) daily with  breakfast for 2 days, THEN 1 tablet (20 mg total) daily with breakfast for 2 days, THEN 0.5 tablets (10 mg total) daily with breakfast for 4 days.   simvastatin (ZOCOR) 40 MG tablet TAKE 1 TABLET BY MOUTH EVERYDAY AT BEDTIME   No facility-administered medications prior to visit.   Review of Systems     Objective    BP (!) 160/83   Pulse 65   Temp 98 F (36.7 C) (Oral)   Ht 5\' 1"  (1.549 m)   Wt 144 lb (65.3 kg)   SpO2 99%   BMI 27.21 kg/m   Physical Exam  Slight swelling of right ear canal. A few very small slightly erythematous papules around ear. Some tender posterior auricular LAD.   Small red patch inferior aspect of left distal left ear canal, but no tenderness.   Assessment & Plan    1. Otalgia of left ear (Primary) Minimal swelling of ear canal. On doxycycline for the last two days. Cannot rule out zoster.  Is to continue doxycycline. Cover for AOE and zoster  - valACYclovir (VALTREX) 1000 MG tablet; Take 1 tablet (1,000 mg total) by mouth every 8 (eight) hours for 7 days.  Dispense: 21 tablet; Refill: 0 - neomycin-polymyxin-hydrocortisone (CORTISPORIN) OTIC solution; Place 3 drops into the right ear 4 (four) times daily for 7 days.  Dispense: 10 mL; Refill: 0   Recommend she hold off on prednisone prescribed from ER until she has been on above for 24 hours. May start prednisone if not signiricantly improved.      Mila Merry, MD  [facility] [phone] (phone) [phone] (fax)  [facility]

## 2023-10-21 ENCOUNTER — Telehealth: Payer: Self-pay

## 2023-10-21 NOTE — Telephone Encounter (Signed)
 Copied from CRM [id number]. Topic: General - Other >> Oct 20, 2023  9:21 AM Truddie Crumble wrote: Reason for CRM: patient daughter called stating the patient has an appointment today at 9:20 and she wanted to let the doctor know that the patient had covid on 2/26 and the patient daughter want to rule out the patient pain and what we can't see. Patient daughter want to see if they can do an MRI

## 2023-12-20 DIAGNOSIS — H40003 Preglaucoma, unspecified, bilateral: Secondary | ICD-10-CM | POA: Diagnosis not present

## 2023-12-30 DIAGNOSIS — H43813 Vitreous degeneration, bilateral: Secondary | ICD-10-CM | POA: Diagnosis not present

## 2023-12-30 DIAGNOSIS — Z961 Presence of intraocular lens: Secondary | ICD-10-CM | POA: Diagnosis not present

## 2023-12-30 DIAGNOSIS — H401131 Primary open-angle glaucoma, bilateral, mild stage: Secondary | ICD-10-CM | POA: Diagnosis not present

## 2024-01-08 ENCOUNTER — Other Ambulatory Visit: Payer: Self-pay | Admitting: Family Medicine

## 2024-01-08 DIAGNOSIS — J309 Allergic rhinitis, unspecified: Secondary | ICD-10-CM

## 2024-01-11 ENCOUNTER — Other Ambulatory Visit: Payer: Self-pay | Admitting: Family Medicine

## 2024-01-11 DIAGNOSIS — I10 Essential (primary) hypertension: Secondary | ICD-10-CM

## 2024-01-24 ENCOUNTER — Ambulatory Visit: Payer: Self-pay

## 2024-01-24 DIAGNOSIS — H6501 Acute serous otitis media, right ear: Secondary | ICD-10-CM | POA: Diagnosis not present

## 2024-01-24 DIAGNOSIS — H60311 Diffuse otitis externa, right ear: Secondary | ICD-10-CM | POA: Diagnosis not present

## 2024-01-24 NOTE — Telephone Encounter (Signed)
 FYI Only or Action Required?: FYI only for provider.  Patient was last seen in primary care on 10/20/2023 by Gasper Nancyann BRAVO, MD. Called Nurse Triage reporting Ear Pain. Symptoms began yesterday. Interventions attempted: OTC medications: Tylenol . Symptoms are: stable. Ear pain. Pt thinks it's inner ear.  Triage Disposition: See Physician Within 24 Hours  Patient/caregiver understands and will follow disposition?: Yes           Copied from CRM [id number]. Topic: Clinical - Red Word Triage >> Jan 24, 2024  3:43 PM Donna BRAVO wrote: Red Word that prompted transfer to Nurse Triage: patient has right ear pain that is getting worse. Pain started behind the ear now it is going into the ear Reason for Disposition  Earache  (Exceptions: brief ear pain of < 60 minutes duration, earache occurring during air travel  Answer Assessment - Initial Assessment Questions 1. LOCATION: Which ear is involved?     Right ear 2. ONSET: When did the ear start hurting      yesterday 3. SEVERITY: How bad is the pain?  (Scale 1-10; mild, moderate or severe)   - MILD (1-3): doesn't interfere with normal activities    - MODERATE (4-7): interferes with normal activities or awakens from sleep    - SEVERE (8-10): excruciating pain, unable to do any normal activities      Sharp - 7/10 4. URI SYMPTOMS: Do you have a runny nose or cough?     no 5. FEVER: Do you have a fever? If Yes, ask: What is your temperature, how was it measured, and when did it start?     no 6. CAUSE: Have you been swimming recently?, How often do you use Q-TIPS?, Have you had any recent air travel or scuba diving?     no 7. OTHER SYMPTOMS: Do you have any other symptoms? (e.g., headache, stiff neck, dizziness, vomiting, runny nose, decreased hearing)     no  Protocols used: Earache-A-AH

## 2024-01-28 ENCOUNTER — Ambulatory Visit: Payer: Self-pay

## 2024-01-28 DIAGNOSIS — Z8669 Personal history of other diseases of the nervous system and sense organs: Secondary | ICD-10-CM | POA: Diagnosis not present

## 2024-01-28 DIAGNOSIS — H6991 Unspecified Eustachian tube disorder, right ear: Secondary | ICD-10-CM | POA: Diagnosis not present

## 2024-01-28 NOTE — Telephone Encounter (Signed)
 FYI Only or Action Required?: FYI only for provider.  Patient was last seen in primary care on 10/20/2023 by Gasper Nancyann BRAVO, MD. Called Nurse Triage reporting Ear Pain. Symptoms began several days ago. Interventions attempted: OTC medications: Extra Strength Tylenol   and Prescription medications: Ciprodex  and Doxycyline. Symptoms are: gradually worsening.  Triage Disposition: See Physician Within 24 Hours- no appts available. This RN advised patient to return to urgent care for re-evaluation. Pt is agreeable.   Patient/caregiver understands and will follow disposition?: Yes         Copied from CRM [id number]. Topic: Clinical - Red Word Triage >> Jan 28, 2024  1:03 PM Deanna Weaver wrote: Red Word that prompted transfer to Nurse Triage: ear pain is worse, started antibiotic on Tuesday- pain level 8.5 Reason for Disposition  [1] Taking antibiotic (ear drops or pills) > 72 hours (3 days) AND [2] ear pain not improved or recurs  Answer Assessment - Initial Assessment Questions 1. ANTIBIOTIC: What antibiotic are you using? How many times a day? Ear drops or pills     Doxycycline  and Cyprodex  2. ONSET: When was the antibiotic started?     Started on 6/23  3. PAIN: How bad is the pain?  (Scale 1-10; mild, moderate or severe)   - MILD (1-3): doesn't interfere with normal activities    - MODERATE (4-7): interferes with normal activities or awakens from sleep    - SEVERE (8-10): excruciating pain, unable to do any normal activities      8.5/10  4. DISCHARGE: Is there any discharge? What color is it?      No discharge from ear  5. FEVER: Do you have a fever? If Yes, ask: What is your temperature, how was it measured, and when did it start?     No fever  6. OTHER SYMPTOMS: Do you have any other symptoms? (e.g., redness of ear, headache, stiff neck, sore throat)     Pain at bottom of ears near cheek bone  7. PREGNANCY: Is there any chance you are pregnant? When was your  last menstrual period?     no  Protocols used: Ear - Otitis Externa Follow-up Call-A-AH

## 2024-04-20 ENCOUNTER — Other Ambulatory Visit: Payer: Self-pay | Admitting: Family Medicine

## 2024-04-20 DIAGNOSIS — I10 Essential (primary) hypertension: Secondary | ICD-10-CM

## 2024-04-20 NOTE — Telephone Encounter (Signed)
 Called pharmacy - pt picked up 90 supply on 04/14/2024.

## 2024-04-20 NOTE — Telephone Encounter (Signed)
 Requested Prescriptions  Refused Prescriptions Disp Refills   amLODipine  (NORVASC ) 5 MG tablet 90 tablet 3    Sig: Take 1 tablet (5 mg total) by mouth daily.     Cardiovascular: Calcium Channel Blockers 2 Failed - 04/20/2024  5:45 PM      Failed - Last BP in normal range    BP Readings from Last 1 Encounters:  10/20/23 (!) 160/83         Failed - Valid encounter within last 6 months    Recent Outpatient Visits           6 months ago Otalgia of left ear   [facility] Gasper Nancyann BRAVO, MD              Passed - Last Heart Rate in normal range    Pulse Readings from Last 1 Encounters:  10/20/23 65

## 2024-04-20 NOTE — Telephone Encounter (Signed)
 Copied from CRM [id number]. Topic: Clinical - Medication Refill >> Apr 20, 2024  8:53 AM Berwyn MATSU wrote: Medication:  amLODipine  (NORVASC ) 5 MG tablet  Has the patient contacted their pharmacy? Yes (Agent: If no, request that the patient contact the pharmacy for the refill. If patient does not wish to contact the pharmacy document the reason why and proceed with request.) (Agent: If yes, when and what did the pharmacy advise?)  This is the patient's preferred pharmacy:  [id number] [place], [place] - [address] [place] [address] Phone: [phone] Fax: [phone]  Is this the correct pharmacy for this prescription? Yes If no, delete pharmacy and type the correct one.   Has the prescription been filled recently? Yes  Is the patient out of the medication? Yes  Has the patient been seen for an appointment in the last year OR does the patient have an upcoming appointment? Yes  Can we respond through MyChart? Yes  Agent: Please be advised that Rx refills may take up to 3 business days. We ask that you follow-up with your pharmacy.

## 2024-05-03 ENCOUNTER — Ambulatory Visit: Payer: Self-pay | Admitting: Emergency Medicine

## 2024-05-03 VITALS — Ht 61.0 in | Wt 145.0 lb

## 2024-05-03 DIAGNOSIS — Z1231 Encounter for screening mammogram for malignant neoplasm of breast: Secondary | ICD-10-CM

## 2024-05-03 DIAGNOSIS — Z1211 Encounter for screening for malignant neoplasm of colon: Secondary | ICD-10-CM

## 2024-05-03 DIAGNOSIS — Z0000 Encounter for general adult medical examination without abnormal findings: Secondary | ICD-10-CM | POA: Diagnosis not present

## 2024-05-03 NOTE — Progress Notes (Signed)
 Subjective:   Deanna Weaver is a 78 y.o. who presents for a Medicare Wellness preventive visit.  As a reminder, Annual Wellness Visits don't include a physical exam, and some assessments may be limited, especially if this visit is performed virtually. We may recommend an in-person follow-up visit with your provider if needed.  Visit Complete: Virtual I connected with  Shanequia H Domenico on 05/03/24 by a audio enabled telemedicine application and verified that I am speaking with the correct person using two identifiers.  Patient Location: Home  Provider Location: Home Office  I discussed the limitations of evaluation and management by telemedicine. The patient expressed understanding and agreed to proceed.  Vital Signs: Because this visit was a virtual/telehealth visit, some criteria may be missing or patient reported. Any vitals not documented were not able to be obtained and vitals that have been documented are patient reported.  VideoDeclined- This patient declined Librarian, academic. Therefore the visit was completed with audio only.  Persons Participating in Visit: Patient.  AWV Questionnaire: No: Patient Medicare AWV questionnaire was not completed prior to this visit.  Cardiac Risk Factors include: advanced age (>30men, >46 women);hypertension;dyslipidemia     Objective:    Today's Vitals   05/03/24 1045  Weight: 145 lb (65.8 kg)  Height: 5' 1 (1.549 m)   Body mass index is 27.4 kg/m.     05/03/2024   10:58 AM 10/19/2023    9:40 PM 05/03/2023   11:27 AM 09/15/2022    9:56 AM 09/04/2022    8:20 AM 04/29/2022    2:19 PM 04/04/2020    2:46 PM  Advanced Directives  Does Patient Have a Medical Advance Directive? No No No No No No No  Would patient like information on creating a medical advance directive? No - Patient declined   No - Patient declined No - Patient declined No - Patient declined No - Patient declined    Current Medications  (verified) Outpatient Encounter Medications as of 05/03/2024  Medication Sig   amLODipine  (NORVASC ) 5 MG tablet TAKE 1 TABLET (5 MG TOTAL) BY MOUTH DAILY.   calcium carbonate (OS-CAL - DOSED IN MG OF ELEMENTAL CALCIUM) 1250 (500 Ca) MG tablet Take 1 tablet by mouth daily.   fluticasone  (FLONASE ) 50 MCG/ACT nasal spray PLACE 2 SPRAYS INTO BOTH NOSTRILS DAILY AS NEEDED. 2(TWO) NASAL EACH NOSTRIL ONCE A DAY AS NEEDED   latanoprost (XALATAN) 0.005 % ophthalmic solution Place 1 drop into both eyes at bedtime.   meloxicam  (MOBIC ) 7.5 MG tablet Take 1 tablet (7.5 mg total) by mouth daily. (Patient taking differently: Take 7.5 mg by mouth daily. Taking PRN)   montelukast  (SINGULAIR ) 10 MG tablet TAKE 1 TABLET (10 MG TOTAL) BY MOUTH DAILY FOR ALLERGIES   Omega-3 Fatty Acids (FISH OIL) 1000 MG CAPS Take 2 capsules by mouth daily.   omeprazole  (PRILOSEC) 40 MG capsule TAKE 1 CAPSULE (40 MG TOTAL) BY MOUTH DAILY.   simvastatin (ZOCOR) 40 MG tablet TAKE 1 TABLET BY MOUTH EVERYDAY AT BEDTIME   CRANBERRY PO Take 1 tablet by mouth 2 (two) times daily. (Patient not taking: Reported on 05/03/2024)   No facility-administered encounter medications on file as of 05/03/2024.    Allergies (verified) Ancef  [cefazolin ], Celecoxib, Lisinopril, Metoprolol, Preservision areds [actical], Cefdinir , Penicillins, and Penlac  [ciclopirox ]   History: Past Medical History:  Diagnosis Date   Allergic rhinitis    Allergy    Arthritis    lower back   GERD (gastroesophageal reflux disease)  Hyperlipidemia    Hypertension    Past Surgical History:  Procedure Laterality Date   ABDOMINAL HYSTERECTOMY     CATARACT EXTRACTION W/ INTRAOCULAR LENS IMPLANT Right    CATARACT EXTRACTION W/PHACO Right 01/16/2019   Procedure: CATARACT EXTRACTION PHACO AND INTRAOCULAR LENS PLACEMENT (IOC) RIGHT;  Surgeon: Myrna Adine Anes, MD;  Location: [facility];  Service: Ophthalmology;  Laterality: Right;   CATARACT EXTRACTION W/PHACO  Left 03/27/2019   Procedure: CATARACT EXTRACTION PHACO AND INTRAOCULAR LENS PLACEMENT (IOC) LEFT;  Surgeon: Myrna Adine Anes, MD;  Location: [facility];  Service: Ophthalmology;  Laterality: Left;   COLONOSCOPY     COLONOSCOPY WITH PROPOFOL  N/A 04/24/2019   Procedure: COLONOSCOPY WITH PROPOFOL ;  Surgeon: Jinny Carmine, MD;  Location: [facility];  Service: Endoscopy;  Laterality: N/A;   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   Procedure: 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.;  Surgeon: Edie Norleen PARAS, MD;  Location: [facility] ORS;  Service: Orthopedics;  Laterality: Right;   FLEXIBLE SIGMOIDOSCOPY N/A 10/09/2019   Procedure: FLEXIBLE SIGMOIDOSCOPY WITH BIOPSY;  Surgeon: Jinny Carmine, MD;  Location: [facility];  Service: Endoscopy;  Laterality: N/A;   FOOT SURGERY     POLYPECTOMY  04/24/2019   Procedure: POLYPECTOMY;  Surgeon: Jinny Carmine, MD;  Location: [facility];  Service: Endoscopy;;   POLYPECTOMY N/A 10/09/2019   Procedure: POLYPECTOMY;  Surgeon: Jinny Carmine, MD;  Location: [facility];  Service: Endoscopy;  Laterality: N/A;   VAGINAL HYSTERECTOMY  1990   Family History  Problem Relation Age of Onset   Cancer Mother    Heart disease Father    Social History   Socioeconomic History   Marital status: Married    Spouse name: Lynwood   Number of children: 4   Years of education: Not on file   Highest education level: 11th grade  Occupational History   Occupation: Retired    Comment: keeps granchildren  Tobacco Use   Smoking status: Never   Smokeless tobacco: Never  Vaping Use   Vaping status: Never Used  Substance and Sexual Activity   Alcohol use: Never   Drug use: No   Sexual activity: Not Currently  Other Topics Concern   Not on file  Social History Narrative   Not on file   Social Drivers of Health   Financial Resource Strain: Low Risk  (05/03/2024)   Overall Financial Resource Strain (CARDIA)     Difficulty of Paying Living Expenses: Not hard at all  Food Insecurity: No Food Insecurity (05/03/2024)   Hunger Vital Sign    Worried About Running Out of Food in the Last Year: Never true    Ran Out of Food in the Last Year: Never true  Transportation Needs: No Transportation Needs (05/03/2024)   PRAPARE - Administrator, Civil Service (Medical): No    Lack of Transportation (Non-Medical): No  Physical Activity: Inactive (05/03/2024)   Exercise Vital Sign    Days of Exercise per Week: 0 days    Minutes of Exercise per Session: 0 min  Stress: No Stress Concern Present (05/03/2024)   [employer] of Occupational Health - Occupational Stress Questionnaire    Feeling of Stress: Not at all  Social Connections: Moderately Integrated (05/03/2024)   Social Connection and Isolation Panel    Frequency of Communication with Friends and Family: More than three times a week    Frequency of Social Gatherings with Friends and Family: More than three times a  week    Attends Religious Services: More than 4 times per year    Active Member of Clubs or Organizations: No    Attends Banker Meetings: Never    Marital Status: Married    Tobacco Counseling Counseling given: Not Answered    Clinical Intake:  Pre-visit preparation completed: Yes  Pain : No/denies pain     BMI - recorded: 27.4 Nutritional Status: BMI 25 -29 Overweight Nutritional Risks: None Diabetes: No  Lab Results  Component Value Date   HGBA1C 5.7 (A) 06/19/2022     How often do you need to have someone help you when you read instructions, pamphlets, or other written materials from your doctor or pharmacy?: 1 - Never  Interpreter Needed?: No  Information entered by :: Vina Ned, CMA   Activities of Daily Living     05/03/2024   10:46 AM  In your present state of health, do you have any difficulty performing the following activities:  Hearing? 0  Vision? 0  Difficulty concentrating  or making decisions? 0  Walking or climbing stairs? 0  Dressing or bathing? 0  Doing errands, shopping? 0  Preparing Food and eating ? N  Using the Toilet? N  In the past six months, have you accidently leaked urine? Y  Comment wears a pad  Do you have problems with loss of bowel control? N  Managing your Medications? N  Managing your Finances? N  Housekeeping or managing your Housekeeping? N    Patient Care Team: Gasper Nancyann BRAVO, MD as PCP - General (Family Medicine) Myrna Adine Anes, MD as Consulting Physician (Ophthalmology) Jinny Carmine, MD as Consulting Physician (Gastroenterology) Dasher, Alm LABOR, MD (Dermatology)  I have updated your Care Teams any recent Medical Services you may have received from other providers in the past year.     Assessment:   This is a routine wellness examination for Shrita.  Hearing/Vision screen Hearing Screening - Comments:: Denies hearing loss  Vision Screening - Comments:: Gets routine eye exams, Dr. Adine Myrna, [place]   Goals Addressed               This Visit's Progress     Weight (lb) < 140 lb (63.5 kg) (pt-stated)   145 lb (65.8 kg)      Depression Screen     05/03/2024   10:55 AM 08/27/2023    8:09 AM 05/03/2023   11:21 AM 03/05/2023   10:14 AM 11/09/2022    3:07 PM 08/06/2022    2:43 PM 06/19/2022    3:33 PM  PHQ 2/9 Scores  PHQ - 2 Score 0 0 0 0 0 0 0  PHQ- 9 Score 0 0   0 0 0    Fall Risk     05/03/2024   10:59 AM 05/03/2023   11:19 AM 03/05/2023   10:14 AM 11/09/2022    3:07 PM 08/06/2022    2:43 PM  Fall Risk   Falls in the past year? 0 0 0 0 0  Number falls in past yr: 0 0 0 0 0  Injury with Fall? 0 0 0 0 0  Risk for fall due to : No Fall Risks No Fall Risks No Fall Risks No Fall Risks No Fall Risks  Follow up Falls evaluation completed Education provided;Falls prevention discussed Falls evaluation completed  Falls evaluation completed      Data saved with a previous flowsheet row definition     MEDICARE RISK AT HOME:  Medicare Risk at Home Any stairs in or around the home?: Yes If so, are there any without handrails?: No Home free of loose throw rugs in walkways, pet beds, electrical cords, etc?: Yes Adequate lighting in your home to reduce risk of falls?: Yes Life alert?: No Use of a cane, walker or w/c?: No Grab bars in the bathroom?: No Shower chair or bench in shower?: No Elevated toilet seat or a handicapped toilet?: Yes  TIMED UP AND GO:  Was the test performed?  No  Cognitive Function: 6CIT completed        05/03/2024   11:00 AM 05/03/2023   11:28 AM 04/29/2022    2:21 PM 04/26/2021   11:20 AM 10/23/2016    9:09 AM  6CIT Screen  What Year? 0 points 0 points 0 points 0 points 4 points  What month? 0 points 0 points 0 points 0 points 0 points  What time? 0 points 0 points 0 points 0 points 0 points  Count back from 20 0 points 0 points 0 points 0 points 0 points  Months in reverse 2 points 0 points 0 points 0 points 0 points  Repeat phrase 6 points 0 points 0 points 2 points 2 points  Total Score 8 points 0 points 0 points 2 points 6 points    Immunizations Immunization History  Administered Date(s) Administered   Influenza,inj,Quad PF,6+ Mos 05/30/2019   Pneumococcal Conjugate-13 11/03/2013   Pneumococcal Polysaccharide-23 10/23/2016    Screening Tests Health Maintenance  Topic Date Due   DTaP/Tdap/Td (1 - Tdap) Never done   Zoster Vaccines- Shingrix (1 of 2) Never done   COVID-19 Vaccine (1 - 2024-25 season) Never done   Colonoscopy  04/23/2024   Mammogram  04/13/2024   Influenza Vaccine  10/31/2024 (Originally 03/03/2024)   Medicare Annual Wellness (AWV)  05/03/2025   DEXA SCAN  09/07/2028   Pneumococcal Vaccine: 50+ Years  Completed   Hepatitis C Screening  Completed   HPV VACCINES  Aged Out   Meningococcal B Vaccine  Aged Out    Health Maintenance Items Addressed: Mammogram ordered, Referral sent to GI for colonoscopy, See Nurse Notes  at the end of this note  Additional Screening:  Vision Screening: Recommended annual ophthalmology exams for early detection of glaucoma and other disorders of the eye. Is the patient up to date with their annual eye exam?  Yes  Who is the provider or what is the name of the office in which the patient attends annual eye exams? Dr. Adine Novak @ [facility]  Dental Screening: Recommended annual dental exams for proper oral hygiene  Community Resource Referral / Chronic Care Management: CRR required this visit?  No   CCM required this visit?  No   Plan:    I have personally reviewed and noted the following in the patient's chart:   Medical and social history Use of alcohol, tobacco or illicit drugs  Current medications and supplements including opioid prescriptions. Patient is not currently taking opioid prescriptions. Functional ability and status Nutritional status Physical activity Advanced directives List of other physicians Hospitalizations, surgeries, and ER visits in previous 12 months Vitals Screenings to include cognitive, depression, and falls Referrals and appointments  In addition, I have reviewed and discussed with patient certain preventive protocols, quality metrics, and best practice recommendations. A written personalized care plan for preventive services as well as general preventive health recommendations were provided to patient.   Vina Ned, CMA   05/03/2024  After Visit Summary: (MyChart) Due to this being a telephonic visit, the after visit summary with patients personalized plan was offered to patient via MyChart   Notes:  6 CIT Score - 8 Placed order for MMG (due 04/13/24) Placed referral for colonoscopy (due 04/23/24) Schedule f/u OV for 05/08/24 (last seen 10/20/23 for sick visit) Declined flu, shingles, Tdap and covid vaccines

## 2024-05-03 NOTE — Patient Instructions (Addendum)
 Deanna Weaver,  Thank you for taking the time for your Medicare Wellness Visit. I appreciate your continued commitment to your health goals. Please review the care plan we discussed, and feel free to reach out if I can assist you further.  Medicare recommends these wellness visits once per year to help you and your care team stay ahead of potential health issues. These visits are designed to focus on prevention, allowing your provider to concentrate on managing your acute and chronic conditions during your regular appointments.  Please note that Annual Wellness Visits do not include a physical exam. Some assessments may be limited, especially if the visit was conducted virtually. If needed, we may recommend a separate in-person follow-up with your provider.  Ongoing Care Seeing your primary care provider every 3 to 6 months helps us  monitor your health and provide consistent, personalized care. I have scheduled you an appointment with Dr. Gasper for Mon, 05/08/24 @ 9:40am.  Referrals If a referral was made during today's visit and you haven't received any updates within two weeks, please contact the referred provider directly to check on the status.         Gastroenterology (GI) for a colonoscopy        [facility]         [address]         [address]         [place], [place] [address]         [phone]           Recommended Screenings: Please call to schedule your mammogram:  [facility] Address: [address], [place], [place] Phone: [phone]  [facility] [address], [address] [place], [place] [address] Phone: [phone]                  Health Maintenance  Topic Date Due   DTaP/Tdap/Td vaccine (1 - Tdap) Never done   Zoster (Shingles) Vaccine (1 of 2) Never done   COVID-19 Vaccine (1 - 2024-25 season) Never done   Colon Cancer Screening  04/23/2024    Breast Cancer Screening  04/13/2024   Flu Shot  10/31/2024*   Medicare Annual Wellness Visit  05/03/2025   DEXA scan (bone density measurement)  09/07/2028   Pneumococcal Vaccine for age over 23  Completed   Hepatitis C Screening  Completed   HPV Vaccine  Aged Out   Meningitis B Vaccine  Aged Out  *Topic was postponed. The date shown is not the original due date.       05/03/2024   10:58 AM  Advanced Directives  Does Patient Have a Medical Advance Directive? No  Would patient like information on creating a medical advance directive? No - Patient declined   Advance Care Planning is important because it: Ensures you receive medical care that aligns with your values, goals, and preferences. Provides guidance to your family and loved ones, reducing the emotional burden of decision-making during critical moments.  Vision: Annual vision screenings are recommended for early detection of glaucoma, cataracts, and diabetic retinopathy. These exams can also reveal signs of chronic conditions such as diabetes and high blood pressure.  Dental: Annual dental screenings help detect early signs of oral cancer, gum disease, and other conditions linked to overall health, including heart disease and diabetes.  Please see the attached documents for additional preventive care recommendations.   Fall Prevention in the  Home, Adult Falls can cause injuries and affect people of all ages. There are many simple things that you can do to make your home safe and to help prevent falls. If you need it, ask for help making these changes. What actions can I take to prevent falls? General information Use good lighting in all rooms. Make sure to: Replace any light bulbs that burn out. Turn on lights if it is dark and use night-lights. Keep items that you use often in easy-to-reach places. Lower the shelves around your home if needed. Move furniture so that there are clear paths around it. Do not keep throw rugs  or other things on the floor that can make you trip. If any of your floors are uneven, fix them. Add color or contrast paint or tape to clearly mark and help you see: Grab bars or handrails. First and last steps of staircases. Where the edge of each step is. If you use a ladder or stepladder: Make sure that it is fully opened. Do not climb a closed ladder. Make sure the sides of the ladder are locked in place. Have someone hold the ladder while you use it. Know where your pets are as you move through your home. What can I do in the bathroom?     Keep the floor dry. Clean up any water  that is on the floor right away. Remove soap buildup in the bathtub or shower. Buildup makes bathtubs and showers slippery. Use non-skid mats or decals on the floor of the bathtub or shower. Attach bath mats securely with double-sided, non-slip rug tape. If you need to sit down while you are in the shower, use a non-slip stool. Install grab bars by the toilet and in the bathtub and shower. Do not use towel bars as grab bars. What can I do in the bedroom? Make sure that you have a light by your bed that is easy to reach. Do not use any sheets or blankets on your bed that hang to the floor. Have a firm bench or chair with side arms that you can use for support when you get dressed. What can I do in the kitchen? Clean up any spills right away. If you need to reach something above you, use a sturdy step stool that has a grab bar. Keep electrical cables out of the way. Do not use floor polish or wax that makes floors slippery. What can I do with my stairs? Do not leave anything on the stairs. Make sure that you have a light switch at the top and the bottom of the stairs. Have them installed if you do not have them. Make sure that there are handrails on both sides of the stairs. Fix handrails that are broken or loose. Make sure that handrails are as long as the staircases. Install non-slip stair treads on all  stairs in your home if they do not have carpet. Avoid having throw rugs at the top or bottom of stairs, or secure the rugs with carpet tape to prevent them from moving. Choose a carpet design that does not hide the edge of steps on the stairs. Make sure that carpet is firmly attached to the stairs. Fix any carpet that is loose or worn. What can I do on the outside of my home? Use bright outdoor lighting. Repair the edges of walkways and driveways and fix any cracks. Clear paths of anything that can make you trip, such as tools or rocks. Add color or contrast  paint or tape to clearly mark and help you see high doorway thresholds. Trim any bushes or trees on the main path into your home. Check that handrails are securely fastened and in good repair. Both sides of all steps should have handrails. Install guardrails along the edges of any raised decks or porches. Have leaves, snow, and ice cleared regularly. Use sand, salt, or ice melt on walkways during winter months if you live where there is ice and snow. In the garage, clean up any spills right away, including grease or oil spills. What other actions can I take? Review your medicines with your health care provider. Some medicines can make you confused or feel dizzy. This can increase your chance of falling. Wear closed-toe shoes that fit well and support your feet. Wear shoes that have rubber soles and low heels. Use a cane, walker, scooter, or crutches that help you move around if needed. Talk with your provider about other ways that you can decrease your risk of falls. This may include seeing a physical therapist to learn to do exercises to improve movement and strength. Where to find more information Centers for Disease Control and Prevention, [employer]AD[employer]: TonerPromos.no [employer] on Aging: BaseRingTones.pl National Institute on Aging: BaseRingTones.pl Contact a health care provider if: You are afraid of falling at home. You feel weak, drowsy, or dizzy  at home. You fall at home. Get help right away if you: Lose consciousness or have trouble moving after a fall. Have a fall that causes a head injury. These symptoms may be an emergency. Get help right away. Call 911. Do not wait to see if the symptoms will go away. Do not drive yourself to the hospital. This information is not intended to replace advice given to you by your health care provider. Make sure you discuss any questions you have with your health care provider. Document Revised: 03/23/2022 Document Reviewed: 03/23/2022 Elsevier Patient Education  2024 [employer].

## 2024-05-08 ENCOUNTER — Ambulatory Visit (INDEPENDENT_AMBULATORY_CARE_PROVIDER_SITE_OTHER): Admitting: Family Medicine

## 2024-05-08 ENCOUNTER — Encounter: Payer: Self-pay | Admitting: Family Medicine

## 2024-05-08 VITALS — BP 146/75 | HR 60 | Ht 61.0 in | Wt 143.3 lb

## 2024-05-08 DIAGNOSIS — H9112 Presbycusis, left ear: Secondary | ICD-10-CM | POA: Diagnosis not present

## 2024-05-08 DIAGNOSIS — N3289 Other specified disorders of bladder: Secondary | ICD-10-CM | POA: Diagnosis not present

## 2024-05-08 DIAGNOSIS — I10 Essential (primary) hypertension: Secondary | ICD-10-CM | POA: Diagnosis not present

## 2024-05-08 DIAGNOSIS — E881 Lipodystrophy, unspecified: Secondary | ICD-10-CM | POA: Diagnosis not present

## 2024-05-08 DIAGNOSIS — L570 Actinic keratosis: Secondary | ICD-10-CM

## 2024-05-08 MED ORDER — OXYBUTYNIN CHLORIDE ER 5 MG PO TB24: 5.0000 mg | ORAL_TABLET | Freq: Every day | ORAL | 3 refills | Status: DC

## 2024-05-08 NOTE — Patient Instructions (Addendum)
 Kegel Exercises  Kegel exercises can help strengthen your pelvic floor muscles. The pelvic floor is a group of muscles that support your rectum, small intestine, and bladder. In females, pelvic floor muscles also help support the uterus. These muscles help you control the flow of urine and stool (feces). Kegel exercises are painless and simple. They do not require any equipment. Your provider may suggest Kegel exercises to: Improve bladder and bowel control. Improve sexual response. Improve weak pelvic floor muscles after surgery to remove the uterus (hysterectomy) or after pregnancy, in females. Improve weak pelvic floor muscles after prostate gland removal or surgery, in males. Kegel exercises involve squeezing your pelvic floor muscles. These are the same muscles you squeeze when you try to stop the flow of urine or keep from passing gas. The exercises can be done while sitting, standing, or lying down, but it is best to vary your position. Ask your health care provider which exercises are safe for you. Do exercises exactly as told by your health care provider and adjust them as directed. Do not begin these exercises until told by your health care provider. Exercises How to do Kegel exercises: Squeeze your pelvic floor muscles tight. You should feel a tight lift in your rectal area. If you are a female, you should also feel a tightness in your vaginal area. Keep your stomach, buttocks, and legs relaxed. Hold the muscles tight for up to 10 seconds. Breathe normally. Relax your muscles for up to 10 seconds. Repeat as told by your health care provider. Repeat this exercise daily as told by your health care provider. Continue to do this exercise for at least 4-6 weeks, or for as long as told by your health care provider. You may be referred to a physical therapist who can help you learn more about how to do Kegel exercises. Depending on your condition, your health care provider may  recommend: Varying how long you squeeze your muscles. Doing several sets of exercises every day. Doing exercises for several weeks. Making Kegel exercises a part of your regular exercise routine. This information is not intended to replace advice given to you by your health care provider. Make sure you discuss any questions you have with your health care provider. Document Revised: 11/28/2020 Document Reviewed: 11/28/2020 Elsevier Patient Education  2024 Elsevier Inc.Please review the attached list of medications and notify my office if there are any errors.   Please bring all of your medications to every appointment so we can make sure that our medication list is the same as yours.

## 2024-05-08 NOTE — Progress Notes (Signed)
 Established patient visit   Patient: Deanna Weaver   DOB: 1945/08/05   78 y.o. Female  MRN: [id number] Visit Date: 05/08/2024  Today's healthcare provider: Nancyann Perry, MD   Chief Complaint  Patient presents with   Medical Management of Chronic Issues    HTN, HLD follow-up   spot on forehead   Subjective    Discussed the use of AI scribe software for clinical note transcription with the patient, who gave verbal consent to proceed.  History of Present Illness   Deanna Weaver is a 78 year old female who presents for a routine checkup and evaluation of skin lesions and bladder leakage.  She has noticed a new spot on her head, raising concerns about its potential suspicious nature. She has an upcoming appointment with her dermatologist in April. She reports another spot that has not gone away.  Bladder leakage has been an issue for about a year, worsening at night. It is more pronounced when lying down, accompanied by an urgent need to urinate. She gets up three to four times at night to urinate. She is aware of Kegel exercises but has not mentioned any current treatment for this issue.  For about a week, she has experienced a bubbling sensation in her ear, akin to water , without pain. The ear was itching badly the previous night. She takes an allergy pill every night and uses a nasal spray. No significant nasal congestion, but a little stuffiness is noted. No pain in the ear with the bubbling sensation.  She occasionally checks her blood pressure at home, which usually runs normal, around 130-140. She notes that her blood pressure tends to be higher during doctor's visits. She is not due for blood work until February.     Lab Results  Component Value Date   CHOL 150 08/27/2023   HDL 35 (L) 08/27/2023   LDLCALC 83 08/27/2023   TRIG 187 (H) 08/27/2023   CHOLHDL 4.3 08/27/2023   Lab Results  Component Value Date   NA 142 08/27/2023   K 5.0 08/27/2023   CREATININE 0.91  08/27/2023   EGFR 65 08/27/2023   GLUCOSE 100 (H) 08/27/2023     Medications: Outpatient Medications Prior to Visit  Medication Sig   amLODipine  (NORVASC ) 5 MG tablet TAKE 1 TABLET (5 MG TOTAL) BY MOUTH DAILY.   calcium carbonate (OS-CAL - DOSED IN MG OF ELEMENTAL CALCIUM) 1250 (500 Ca) MG tablet Take 1 tablet by mouth daily.   fluticasone  (FLONASE ) 50 MCG/ACT nasal spray PLACE 2 SPRAYS INTO BOTH NOSTRILS DAILY AS NEEDED. 2(TWO) NASAL EACH NOSTRIL ONCE A DAY AS NEEDED   latanoprost (XALATAN) 0.005 % ophthalmic solution Place 1 drop into both eyes at bedtime.   meloxicam  (MOBIC ) 7.5 MG tablet Take 1 tablet (7.5 mg total) by mouth daily. (Patient taking differently: Take 7.5 mg by mouth daily. Taking PRN)   montelukast  (SINGULAIR ) 10 MG tablet TAKE 1 TABLET (10 MG TOTAL) BY MOUTH DAILY FOR ALLERGIES   Omega-3 Fatty Acids (FISH OIL) 1000 MG CAPS Take 2 capsules by mouth daily.   omeprazole  (PRILOSEC) 40 MG capsule TAKE 1 CAPSULE (40 MG TOTAL) BY MOUTH DAILY.   simvastatin (ZOCOR) 40 MG tablet TAKE 1 TABLET BY MOUTH EVERYDAY AT BEDTIME   CRANBERRY PO Take 1 tablet by mouth 2 (two) times daily. (Patient not taking: Reported on 05/03/2024)   No facility-administered medications prior to visit.        Objective    BP ROLLEN)  146/75 (BP Location: Right Arm, Patient Position: Sitting, Cuff Size: Normal)   Pulse 60   Ht 5' 1 (1.549 m)   Wt 143 lb 4.8 oz (65 kg)   SpO2 99%   BMI 27.08 kg/m   Physical Exam   General appearance: Well developed, well nourished female, cooperative and in no acute distress Head: Normocephalic, without obvious abnormality, atraumatic Respiratory: Respirations even and unlabored, normal respiratory rate Extremities: All extremities are intact.  Skin: Skin color, texture, turgor normal. Ak noted on left upper forehead and left forearm.  Psych: Appropriate mood and affect. Neurologic: Mental status: Alert, oriented to person, place, and time, thought content  appropriate.     Assessment & Plan    1. Irritable bladder (Primary) try oxybutynin (DITROPAN XL) 5 MG 24 hr tablet; Take 1 tablet (5 mg total) by mouth at bedtime.  Dispense: 30 tablet; Refill: 3  Likely component of stress incontinenace and recommended and given printed instructions regarding Kegel exercise.   2. Presbycusis of left ear, unspecified hearing status on contralateral side Likely a component of eustachian dysfunctions. Is to continue OTC antihistamine and allergy nasal spray.  - Ambulatory referral to Audiology  3. Primary hypertension Well controlled.  Continue current medications.    4. Lipodystrophy She is tolerating simvastatin well with no adverse effects.    5. Actinic keratoses Is to follow up with dermatologist in April as scheduled.   Return in about 4 months (around 09/08/2024).     Nancyann Perry, MD  [facility] [phone] (phone) [phone] (fax)  [facility]

## 2024-05-30 NOTE — Progress Notes (Unsigned)
 05/31/2024 Deanna Weaver [id number] 1945/12/23  Gastroenterology Office Note    Referring Provider: Gasper Nancyann BRAVO, MD Primary Care Physician:  Gasper Nancyann BRAVO, MD  Primary GI Provider: Jinny Carmine, MD    Chief Complaint   Chief Complaint  Patient presents with   Other    Colon screening- has hemorrhoids -no issues today     History of Present Illness   Deanna Weaver is a 78 y.o. female with PMHX of colon polyps presenting today at the request of Fisher, Nancyann BRAVO, MD to schedule colonoscopy.  Patient had last colonoscopy in 2020 and a flexible sigmoidoscopy in 2021.  Patient reports having a bowel movement daily, sometimes she does have to strain.  Occasionally she endorses having constipation and will take a stool softener.  Patient states she has history of hemorrhoids. Every couple of weeks she may see bright red blood when she wipes if she has had to strain.  States that she has seen the bright red blood off and on for the last several years. Denies rectal pain or itching.  Denies abdominal pain, melena, or unintentional weight loss.  She does not take supplemental fiber, only drinks 2 bottles of water  a day.    10/09/2019 flexible sigmoidoscopy - one 8 mm polyp in the rectum. - HYPERPLASTIC POLYP.  NEGATIVE FOR DYSPLASIA AND MALIGNANCY.   04/24/2019 colonoscopy - Two 4 to 6 mm polyps in the ascending colon, removed with a cold snare. Resected and retrieved.  Tubular adenoma - Four 2 to 5 mm polyps in the transverse colon, removed with a cold biopsy forceps. Resected and retrieved.  Tubular adenoma  - Four 2 to 4 mm polyps in the sigmoid colon, removed with a cold biopsy forceps. Resected and retrieved.  Hyperplastic , no dysplasia - One 12 mm polyp in the rectum. Biopsied.  - Diverticulosis in the sigmoid colon. - Repeat in 5 years  Past Medical History:  Diagnosis Date   Allergic rhinitis    Allergy    Arthritis    lower back   GERD (gastroesophageal reflux  disease)    Hyperlipidemia    Hypertension    Metatarsalgia of right foot 10/07/2020    Past Surgical History:  Procedure Laterality Date   ABDOMINAL HYSTERECTOMY     CATARACT EXTRACTION W/ INTRAOCULAR LENS IMPLANT Right    CATARACT EXTRACTION W/PHACO Right 01/16/2019   Procedure: CATARACT EXTRACTION PHACO AND INTRAOCULAR LENS PLACEMENT (IOC) RIGHT;  Surgeon: Myrna Adine Anes, MD;  Location: [facility];  Service: Ophthalmology;  Laterality: Right;   CATARACT EXTRACTION W/PHACO Left 03/27/2019   Procedure: CATARACT EXTRACTION PHACO AND INTRAOCULAR LENS PLACEMENT (IOC) LEFT;  Surgeon: Myrna Adine Anes, MD;  Location: [facility];  Service: Ophthalmology;  Laterality: Left;   COLONOSCOPY     COLONOSCOPY WITH PROPOFOL  N/A 04/24/2019   Procedure: COLONOSCOPY WITH PROPOFOL ;  Surgeon: Jinny Carmine, MD;  Location: [facility];  Service: Endoscopy;  Laterality: N/A;   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   Procedure: 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.;  Surgeon: Edie Norleen PARAS, MD;  Location: [facility];  Service: Orthopedics;  Laterality: Right;   FLEXIBLE SIGMOIDOSCOPY N/A 10/09/2019   Procedure: FLEXIBLE SIGMOIDOSCOPY WITH BIOPSY;  Surgeon: Jinny Carmine, MD;  Location: [facility];  Service: Endoscopy;  Laterality: N/A;   FOOT SURGERY     POLYPECTOMY  04/24/2019   Procedure: POLYPECTOMY;  Surgeon: Jinny Carmine, MD;  Location: [facility];  Service: Endoscopy;;  POLYPECTOMY N/A 10/09/2019   Procedure: POLYPECTOMY;  Surgeon: Jinny Carmine, MD;  Location: [facility];  Service: Endoscopy;  Laterality: N/A;   VAGINAL HYSTERECTOMY  1990    Current Outpatient Medications  Medication Sig Dispense Refill   amLODipine  (NORVASC ) 5 MG tablet TAKE 1 TABLET (5 MG TOTAL) BY MOUTH DAILY. 90 tablet 3   calcium carbonate (OS-CAL - DOSED IN MG OF ELEMENTAL CALCIUM) 1250 (500 Ca) MG tablet Take 1 tablet by mouth daily.      fluticasone  (FLONASE ) 50 MCG/ACT nasal spray PLACE 2 SPRAYS INTO BOTH NOSTRILS DAILY AS NEEDED. 2(TWO) NASAL EACH NOSTRIL ONCE A DAY AS NEEDED 48 mL 1   latanoprost (XALATAN) 0.005 % ophthalmic solution Place 1 drop into both eyes at bedtime.     montelukast  (SINGULAIR ) 10 MG tablet TAKE 1 TABLET (10 MG TOTAL) BY MOUTH DAILY FOR ALLERGIES 90 tablet 3   Na Sulfate-K Sulfate-Mg Sulfate concentrate (SUPREP) 17.5-3.13-1.6 GM/177ML SOLN Take 1 kit (354 mLs total) by mouth once for 1 dose. At 5 PM the day before your procedure pour the contents of one bottle of Suprep into the mixing container provided.  Fill the container, with ice cold water , up to the 16 oz fill line, and drink the entire amount. Then 5 hours before procedure pour the contents of the second bottle of Suprep into the mixing container provided and follow the same instructions. 354 mL 0   Omega-3 Fatty Acids (FISH OIL) 1000 MG CAPS Take 2 capsules by mouth daily.     omeprazole  (PRILOSEC) 40 MG capsule TAKE 1 CAPSULE (40 MG TOTAL) BY MOUTH DAILY. 90 capsule 0   simvastatin (ZOCOR) 40 MG tablet TAKE 1 TABLET BY MOUTH EVERYDAY AT BEDTIME 90 tablet 4   No current facility-administered medications for this visit.    Allergies as of 05/31/2024 - Review Complete 05/31/2024  Allergen Reaction Noted   Ancef  [cefazolin ] Anaphylaxis 09/16/2022   Celecoxib Swelling 04/02/2015   Lisinopril  07/22/2023   Metoprolol  04/02/2015   Preservision areds [actical] Nausea Only 04/04/2020   Cefdinir  Rash 06/30/2020   Penicillins Rash 03/20/2019   Penlac  [ciclopirox ] Rash 09/04/2022    Family History  Problem Relation Age of Onset   Cancer Mother    Heart disease Father     Social History   Socioeconomic History   Marital status: Married    Spouse name: Lynwood   Number of children: 4   Years of education: Not on file   Highest education level: 11th grade  Occupational History   Occupation: Retired    Comment: keeps granchildren  Tobacco  Use   Smoking status: Never   Smokeless tobacco: Never  Vaping Use   Vaping status: Never Used  Substance and Sexual Activity   Alcohol use: Never   Drug use: No   Sexual activity: Not Currently  Other Topics Concern   Not on file  Social History Narrative   Not on file   Social Drivers of Health   Financial Resource Strain: Low Risk  (05/03/2024)   Overall Financial Resource Strain (CARDIA)    Difficulty of Paying Living Expenses: Not hard at all  Food Insecurity: No Food Insecurity (05/03/2024)   Hunger Vital Sign    Worried About Running Out of Food in the Last Year: Never true    Ran Out of Food in the Last Year: Never true  Transportation Needs: No Transportation Needs (05/03/2024)   PRAPARE - Administrator, Civil Service (Medical): No  Lack of Transportation (Non-Medical): No  Physical Activity: Inactive (05/03/2024)   Exercise Vital Sign    Days of Exercise per Week: 0 days    Minutes of Exercise per Session: 0 min  Stress: No Stress Concern Present (05/03/2024)   [employer] of Occupational Health - Occupational Stress Questionnaire    Feeling of Stress: Not at all  Social Connections: Moderately Integrated (05/03/2024)   Social Connection and Isolation Panel    Frequency of Communication with Friends and Family: More than three times a week    Frequency of Social Gatherings with Friends and Family: More than three times a week    Attends Religious Services: More than 4 times per year    Active Member of Clubs or Organizations: No    Attends Banker Meetings: Never    Marital Status: Married  Catering Manager Violence: Not At Risk (05/03/2024)   Humiliation, Afraid, Rape, and Kick questionnaire    Fear of Current or Ex-Partner: No    Emotionally Abused: No    Physically Abused: No    Sexually Abused: No     RELEVANT GI HISTORY, IMAGING AND LABS: CBC    Component Value Date/Time   WBC 7.2 08/27/2023 0837   RBC 4.79 08/27/2023  0837   HGB 15.2 08/27/2023 0837   HCT 45.0 08/27/2023 0837   PLT 207 08/27/2023 0837   MCV 94 08/27/2023 0837   MCH 31.7 08/27/2023 0837   MCHC 33.8 08/27/2023 0837   RDW 11.8 08/27/2023 0837   Recent Labs    08/27/23 0837  HGB 15.2    CMP     Component Value Date/Time   NA 142 08/27/2023 0837   K 5.0 08/27/2023 0837   CL 101 08/27/2023 0837   CO2 26 08/27/2023 0837   GLUCOSE 100 (H) 08/27/2023 0837   BUN 16 08/27/2023 0837   CREATININE 0.91 08/27/2023 0837   CALCIUM 10.2 08/27/2023 0837   PROT 7.2 08/27/2023 0837   ALBUMIN 4.8 08/27/2023 0837   AST 21 08/27/2023 0837   ALT 18 08/27/2023 0837   ALKPHOS 81 08/27/2023 0837   BILITOT 0.5 08/27/2023 0837   GFRNONAA 60 06/18/2020 1626   GFRAA 69 06/18/2020 1626      Latest Ref Rng & Units 08/27/2023    8:37 AM 12/08/2021    8:37 AM 06/18/2020    4:26 PM  Hepatic Function  Total Protein 6.0 - 8.5 g/dL 7.2  7.3  7.4   Albumin 3.8 - 4.8 g/dL 4.8  4.8  4.8   AST 0 - 40 IU/L 21  21  19    ALT 0 - 32 IU/L 18  19  13    Alk Phosphatase 44 - 121 IU/L 81  61  67   Total Bilirubin 0.0 - 1.2 mg/dL 0.5  0.4  0.5       Review of Systems   All systems reviewed and negative except where noted in HPI.    Physical Exam  BP (!) 161/71   Pulse 67   Temp 98.2 F (36.8 C)   Ht 5' 1 (1.549 m)   Wt 144 lb 6.4 oz (65.5 kg)   SpO2 95%   BMI 27.28 kg/m   No LMP recorded. Patient has had a hysterectomy. General:   Alert and oriented. Pleasant and cooperative. Well-nourished and well-developed. In no acute distress.  Head:  Normocephalic and atraumatic. Eyes:  Without icterus Ears:  Normal auditory acuity. Lungs:  Respirations even and unlabored.  Clear  throughout to auscultation.   No wheezes, crackles, or rhonchi. No acute distress. Heart:  Regular rate and rhythm; no murmurs, clicks, rubs, or gallops. Abdomen:  Normal bowel sounds.  No bruits.  Soft, non-tender and non-distended without masses, hepatosplenomegaly or hernias  noted.  No guarding or rebound tenderness.  Rectal:  Deferred. Msk:  Symmetrical without gross deformities. Normal posture. Extremities:  Without edema. Neurologic:  Alert and  oriented x4;  grossly normal neurologically. Skin:  Intact without significant lesions or rashes. Psych:  Alert and cooperative. Normal mood and affect.   Assessment & Plan   Deanna Weaver is a 78 y.o. female presenting today to schedule colonoscopy. She is having occasional rectal bleeding and constipation.   Rectal bleeding suspect due to internal hemorrhoids -  Avoid straining - Take Miralax or stool softener as needed - proceed with colonoscopy in near future: the risks, benefits, and alternatives have been discussed with the patient in detail, which include, but are not limited to: bleeding, infection, perforation & drug reaction. The patient states understanding and desires to proceed. - Patient would like to discuss further options for hemorrhoids following colonoscopy.   Occasional constipation.  - Recommend High Fiber diet with fruits, vegetables, and whole grains. - Drink 64 ounces of Fluids Daily. - Start Miralax Mix 1 capful in a drink daily. - Start Benefiber Mix 1 TBSP in a drink daily.  History of colon polyps - Proceed with colonoscopy.   Follow up in 3 months   Grayce Bohr, DNP, AGNP-C [facility]

## 2024-05-31 ENCOUNTER — Ambulatory Visit (INDEPENDENT_AMBULATORY_CARE_PROVIDER_SITE_OTHER): Admitting: Family Medicine

## 2024-05-31 ENCOUNTER — Encounter: Payer: Self-pay | Admitting: Family Medicine

## 2024-05-31 VITALS — BP 161/71 | HR 67 | Temp 98.2°F | Ht 61.0 in | Wt 144.4 lb

## 2024-05-31 DIAGNOSIS — K5900 Constipation, unspecified: Secondary | ICD-10-CM | POA: Diagnosis not present

## 2024-05-31 DIAGNOSIS — Z860101 Personal history of adenomatous and serrated colon polyps: Secondary | ICD-10-CM

## 2024-05-31 DIAGNOSIS — K625 Hemorrhage of anus and rectum: Secondary | ICD-10-CM | POA: Diagnosis not present

## 2024-05-31 DIAGNOSIS — Z860100 Personal history of colon polyps, unspecified: Secondary | ICD-10-CM

## 2024-05-31 DIAGNOSIS — K5909 Other constipation: Secondary | ICD-10-CM

## 2024-05-31 DIAGNOSIS — Z860102 Personal history of hyperplastic colon polyps: Secondary | ICD-10-CM | POA: Diagnosis not present

## 2024-05-31 MED ORDER — NA SULFATE-K SULFATE-MG SULF 17.5-3.13-1.6 GM/177ML PO SOLN
1.0000 | Freq: Once | ORAL | 0 refills | Status: AC
Start: 2024-05-31 — End: 2024-05-31

## 2024-05-31 NOTE — Patient Instructions (Signed)
 Drink 64 ounces of Fluids Daily. Start Miralax Mix 1/2 - 1 capful in a drink daily or every other day. Start Benefiber Mix 1 TBSP in a drink daily.

## 2024-06-21 DIAGNOSIS — H6592 Unspecified nonsuppurative otitis media, left ear: Secondary | ICD-10-CM | POA: Diagnosis not present

## 2024-06-21 DIAGNOSIS — R0981 Nasal congestion: Secondary | ICD-10-CM | POA: Diagnosis not present

## 2024-06-21 DIAGNOSIS — J320 Chronic maxillary sinusitis: Secondary | ICD-10-CM | POA: Diagnosis not present

## 2024-06-27 ENCOUNTER — Ambulatory Visit
Admission: RE | Admit: 2024-06-27 | Discharge: 2024-06-27 | Disposition: A | Discharge status: Home / Self Care | Source: Ambulatory Visit | Attending: Family Medicine | Admitting: Family Medicine

## 2024-06-27 DIAGNOSIS — Z1231 Encounter for screening mammogram for malignant neoplasm of breast: Secondary | ICD-10-CM | POA: Diagnosis not present

## 2024-07-10 ENCOUNTER — Encounter: Payer: Self-pay | Admitting: Family Medicine

## 2024-07-10 ENCOUNTER — Ambulatory Visit: Admitting: Family Medicine

## 2024-07-10 VITALS — BP 136/78 | HR 74 | Ht 61.0 in | Wt 144.7 lb

## 2024-07-10 DIAGNOSIS — H6691 Otitis media, unspecified, right ear: Secondary | ICD-10-CM

## 2024-07-10 DIAGNOSIS — H6993 Unspecified Eustachian tube disorder, bilateral: Secondary | ICD-10-CM

## 2024-07-10 MED ORDER — AZITHROMYCIN 250 MG PO TABS: ORAL_TABLET | ORAL | 0 refills | Status: AC

## 2024-07-10 NOTE — Progress Notes (Signed)
 Established patient visit   Patient: Deanna Weaver   DOB: 1946-06-13   78 y.o. Female  MRN: [id number] Visit Date: 07/10/2024  Today's healthcare provider: Nancyann Perry, MD   Chief Complaint  Patient presents with   Ear Pain    Patient reports right ear has been having sharp pain for a few days.    Subjective    Discussed the use of AI scribe software for clinical note transcription with the patient, who gave verbal consent to proceed.  History of Present Illness   Deanna Weaver is a 78 year old female who presents with right ear pain and sinus congestion.  She has been experiencing right ear pain for the past two to three days. Initially, the pain subsided but returned yesterday and has persisted since then. She describes the sensation as fluid in the ear and notes that it was 'raging last night.' She previously experienced left ear pain about three weeks ago, for which she visited a walk-in clinic and was prescribed antibiotics.  She has sinus congestion with a stuffy nose but no recent cold. No fevers, chills, or sweats. She has a history of sinus issues, although not severe, and recalls a previous episode where her sinuses felt clogged but later improved. She is currently using a nasal spray, such as Flonase  or Nasacort, for her sinus symptoms.  She has an upcoming appointment with a specialist to evaluate her hearing. Her medication allergies include penicillin, cefdinir , and Celebrex, which causes swelling. She recalls being prescribed doxycycline  previously and is uncertain about allergies to sulfa antibiotics.       Medications: Outpatient Medications Prior to Visit  Medication Sig   amLODipine  (NORVASC ) 5 MG tablet TAKE 1 TABLET (5 MG TOTAL) BY MOUTH DAILY.   calcium carbonate (OS-CAL - DOSED IN MG OF ELEMENTAL CALCIUM) 1250 (500 Ca) MG tablet Take 1 tablet by mouth daily.   fluticasone  (FLONASE ) 50 MCG/ACT nasal spray PLACE 2 SPRAYS INTO BOTH NOSTRILS DAILY AS  NEEDED. 2(TWO) NASAL EACH NOSTRIL ONCE A DAY AS NEEDED   latanoprost (XALATAN) 0.005 % ophthalmic solution Place 1 drop into both eyes at bedtime.   montelukast  (SINGULAIR ) 10 MG tablet TAKE 1 TABLET (10 MG TOTAL) BY MOUTH DAILY FOR ALLERGIES   Omega-3 Fatty Acids (FISH OIL) 1000 MG CAPS Take 2 capsules by mouth daily.   omeprazole  (PRILOSEC) 40 MG capsule TAKE 1 CAPSULE (40 MG TOTAL) BY MOUTH DAILY.   simvastatin (ZOCOR) 40 MG tablet TAKE 1 TABLET BY MOUTH EVERYDAY AT BEDTIME   No facility-administered medications prior to visit.   Review of Systems  Constitutional:  Negative for appetite change, chills, fatigue and fever.  Respiratory:  Negative for chest tightness and shortness of breath.   Cardiovascular:  Negative for chest pain and palpitations.  Gastrointestinal:  Negative for abdominal pain, nausea and vomiting.  Neurological:  Negative for dizziness and weakness.       Objective    BP 136/78 (BP Location: Left Arm, Patient Position: Sitting, Cuff Size: Normal)   Pulse 74   Ht 5' 1 (1.549 m)   Wt 144 lb 11.2 oz (65.6 kg)   SpO2 99%   BMI 27.34 kg/m   Physical Exam   General Appearance:    Well developed, well nourished female, alert, cooperative, in no acute distress  HENT:   neck without nodes, sinuses nontender. Left TM dull. Right TM mildly erythematous.   Eyes:    PERRL, conjunctiva/corneas clear, EOM's intact  Lungs:     Clear to auscultation bilaterally, respirations unlabored  Heart:    Normal heart rate. Normal rhythm. No murmurs, rubs, or gallops.    Neurologic:   Awake, alert, oriented x 3. No apparent focal neurological           defect.          Assessment & Plan    1. Right otitis media, unspecified otitis media type (Primary) Recently completely course of doxycycline  for same on left side.  - azithromycin  (ZITHROMAX ) 250 MG tablet; Take 2 tablets on day 1, then 1 tablet daily on days 2 through 5  Dispense: 6 tablet; Refill: 0  2. Dysfunction of  both eustachian tubes Continue nasal steroid. Follow up ENT on 07-21-24 as already scheduled.   O[place]     Nancyann Perry, MD  [facility] [phone] (phone) [phone] (fax)  [facility]

## 2024-07-21 ENCOUNTER — Other Ambulatory Visit: Payer: Self-pay | Admitting: Otolaryngology

## 2024-07-21 ENCOUNTER — Other Ambulatory Visit: Payer: Self-pay | Admitting: Family Medicine

## 2024-07-21 DIAGNOSIS — R490 Dysphonia: Secondary | ICD-10-CM

## 2024-07-21 DIAGNOSIS — J309 Allergic rhinitis, unspecified: Secondary | ICD-10-CM

## 2024-07-21 DIAGNOSIS — H9201 Otalgia, right ear: Secondary | ICD-10-CM

## 2024-07-21 MED ORDER — FLUTICASONE PROPIONATE 50 MCG/ACT NA SUSP: 2.0000 | Freq: Every day | NASAL | 2 refills | Status: AC | PRN

## 2024-07-21 MED ORDER — OMEPRAZOLE 40 MG PO CPDR: 40.0000 mg | DELAYED_RELEASE_CAPSULE | Freq: Every day | ORAL | 4 refills | Status: AC

## 2024-07-21 NOTE — Addendum Note (Signed)
 Addended by: GASPER NANCYANN BRAVO on: 07/21/2024 12:46 PM   Modules accepted: Orders

## 2024-07-25 ENCOUNTER — Ambulatory Visit
Admission: RE | Admit: 2024-07-25 | Discharge: 2024-07-25 | Disposition: A | Discharge status: Home / Self Care | Source: Ambulatory Visit | Attending: Otolaryngology | Admitting: Otolaryngology

## 2024-07-25 DIAGNOSIS — H9201 Otalgia, right ear: Secondary | ICD-10-CM

## 2024-08-03 ENCOUNTER — Other Ambulatory Visit: Payer: Self-pay | Admitting: Family Medicine

## 2024-08-03 DIAGNOSIS — N3289 Other specified disorders of bladder: Secondary | ICD-10-CM

## 2024-08-07 ENCOUNTER — Ambulatory Visit: Admitting: Certified Registered"

## 2024-08-07 ENCOUNTER — Encounter
Admission: RE | Disposition: A | Discharge status: Home / Self Care | Payer: Self-pay | Source: Home / Self Care | Attending: Gastroenterology

## 2024-08-07 ENCOUNTER — Ambulatory Visit
Admission: RE | Admit: 2024-08-07 | Discharge: 2024-08-07 | Disposition: A | Discharge status: Home / Self Care | Attending: Gastroenterology | Admitting: Gastroenterology

## 2024-08-07 ENCOUNTER — Encounter: Payer: Self-pay | Admitting: Gastroenterology

## 2024-08-07 DIAGNOSIS — K219 Gastro-esophageal reflux disease without esophagitis: Secondary | ICD-10-CM | POA: Diagnosis not present

## 2024-08-07 DIAGNOSIS — M1990 Unspecified osteoarthritis, unspecified site: Secondary | ICD-10-CM | POA: Insufficient documentation

## 2024-08-07 DIAGNOSIS — K5140 Inflammatory polyps of colon without complications: Secondary | ICD-10-CM

## 2024-08-07 DIAGNOSIS — Z860100 Personal history of colon polyps, unspecified: Secondary | ICD-10-CM

## 2024-08-07 DIAGNOSIS — D123 Benign neoplasm of transverse colon: Secondary | ICD-10-CM | POA: Diagnosis not present

## 2024-08-07 DIAGNOSIS — K621 Rectal polyp: Secondary | ICD-10-CM | POA: Diagnosis not present

## 2024-08-07 DIAGNOSIS — Z1211 Encounter for screening for malignant neoplasm of colon: Secondary | ICD-10-CM | POA: Insufficient documentation

## 2024-08-07 DIAGNOSIS — Z860101 Personal history of adenomatous and serrated colon polyps: Secondary | ICD-10-CM

## 2024-08-07 DIAGNOSIS — I10 Essential (primary) hypertension: Secondary | ICD-10-CM | POA: Diagnosis not present

## 2024-08-07 DIAGNOSIS — K635 Polyp of colon: Secondary | ICD-10-CM

## 2024-08-07 DIAGNOSIS — K5730 Diverticulosis of large intestine without perforation or abscess without bleeding: Secondary | ICD-10-CM | POA: Insufficient documentation

## 2024-08-07 HISTORY — PX: POLYPECTOMY: SHX149

## 2024-08-07 HISTORY — PX: COLONOSCOPY: SHX5424

## 2024-08-07 HISTORY — PX: BIOPSY OF SKIN SUBCUTANEOUS TISSUE AND/OR MUCOUS MEMBRANE: SHX6741

## 2024-08-07 SURGERY — COLONOSCOPY
Anesthesia: General

## 2024-08-07 MED ORDER — PROPOFOL 10 MG/ML IV BOLUS
INTRAVENOUS | Status: DC | PRN
  Administered 2024-08-07: 30 mg via INTRAVENOUS
  Administered 2024-08-07: 40 mg via INTRAVENOUS

## 2024-08-07 MED ORDER — PROPOFOL 500 MG/50ML IV EMUL
INTRAVENOUS | Status: DC | PRN
  Administered 2024-08-07: 140 ug/kg/min via INTRAVENOUS

## 2024-08-07 MED ORDER — SIMETHICONE 40 MG/0.6ML PO SUSP
ORAL | Status: DC | PRN
  Administered 2024-08-07: 120 mL

## 2024-08-07 MED ORDER — PROPOFOL 1000 MG/100ML IV EMUL
INTRAVENOUS | Status: AC
  Filled 2024-08-07: qty 200

## 2024-08-07 MED ORDER — SODIUM CHLORIDE 0.9 % IV SOLN: INTRAVENOUS | Status: DC

## 2024-08-07 NOTE — Transfer of Care (Signed)
 Immediate Anesthesia Transfer of Care Note  Patient: Deanna Weaver  Procedure(s) Performed: COLONOSCOPY  Patient Location: PACU  Anesthesia Type:General  Level of Consciousness: drowsy  Airway & Oxygen Therapy: Patient Spontanous Breathing  Post-op Assessment: Report given to RN, Post -op Vital signs reviewed and stable, and Patient moving all extremities X 4  Post vital signs: Reviewed and stable  Last Vitals:  Vitals Value Taken Time  BP 102/52 08/07/24 08:12  Temp 36.1 C 08/07/24 08:12  Pulse 62 08/07/24 08:12  Resp 14 08/07/24 08:12  SpO2 93 % 08/07/24 08:12    Last Pain:  Vitals:   08/07/24 0812  TempSrc: Tympanic  PainSc: Asleep         Complications: No notable events documented.

## 2024-08-07 NOTE — Anesthesia Preprocedure Evaluation (Signed)
 "                                  Anesthesia Evaluation  Patient identified by MRN, date of birth, ID band Patient awake    Reviewed: Allergy & Precautions, NPO status , Patient's Chart, lab work & pertinent test results  Airway Mallampati: II  TM Distance: >3 FB Neck ROM: full    Dental  (+) Caps,    Pulmonary neg pulmonary ROS   Pulmonary exam normal        Cardiovascular Exercise Tolerance: Good hypertension, Pt. on medications negative cardio ROS Normal cardiovascular exam Rhythm:Regular Rate:Normal     Neuro/Psych negative neurological ROS  negative psych ROS   GI/Hepatic negative GI ROS, Neg liver ROS,GERD  Medicated,,  Endo/Other  negative endocrine ROS    Renal/GU negative Renal ROS  negative genitourinary   Musculoskeletal  (+) Arthritis ,    Abdominal Normal abdominal exam  (+)   Peds negative pediatric ROS (+)  Hematology negative hematology ROS (+)   Anesthesia Other Findings Past Medical History: No date: Allergic rhinitis No date: Allergy No date: Arthritis     Comment:  lower back No date: GERD (gastroesophageal reflux disease) No date: Hyperlipidemia No date: Hypertension 10/07/2020: Metatarsalgia of right foot  Past Surgical History: No date: ABDOMINAL HYSTERECTOMY No date: CATARACT EXTRACTION W/ INTRAOCULAR LENS IMPLANT; Right 01/16/2019: CATARACT EXTRACTION W/PHACO; Right     Comment:  Procedure: CATARACT EXTRACTION PHACO AND INTRAOCULAR               LENS PLACEMENT (IOC) RIGHT;  Surgeon: Myrna Adine Anes,              MD;  Location: [facility];  Service:               Ophthalmology;  Laterality: Right; 03/27/2019: CATARACT EXTRACTION W/PHACO; Left     Comment:  Procedure: CATARACT EXTRACTION PHACO AND INTRAOCULAR               LENS PLACEMENT (IOC) LEFT;  Surgeon: Myrna Adine Anes,               MD;  Location: [facility];  Service:               Ophthalmology;  Laterality: Left; No date:  COLONOSCOPY 04/24/2019: COLONOSCOPY WITH PROPOFOL ; N/A     Comment:  Procedure: COLONOSCOPY WITH PROPOFOL ;  Surgeon: Jinny Carmine, MD;  Location: [facility];  Service:               Endoscopy;  Laterality: N/A; 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; Right     Comment:  Procedure: 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.;  Surgeon: Edie Norleen PARAS, MD;                Location: [facility];  Service: Orthopedics;  Laterality:               Right; 10/09/2019: FLEXIBLE SIGMOIDOSCOPY; N/A     Comment:  Procedure: FLEXIBLE SIGMOIDOSCOPY WITH BIOPSY;  Surgeon:              Jinny Carmine, MD;  Location: [facility];  Service: Endoscopy;  Laterality: N/A; No date: FOOT SURGERY 04/24/2019: POLYPECTOMY     Comment:  Procedure: POLYPECTOMY;  Surgeon: Jinny Carmine, MD;                Location: [facility];  Service: Endoscopy;; 10/09/2019: POLYPECTOMY; N/A     Comment:  Procedure: POLYPECTOMY;  Surgeon: Jinny Carmine, MD;                Location: [facility];  Service: Endoscopy;                Laterality: N/A; 1990: VAGINAL HYSTERECTOMY  BMI    Body Mass Index: 26.45 kg/m      Reproductive/Obstetrics negative OB ROS                              Anesthesia Physical Anesthesia Plan  ASA: 2  Anesthesia Plan: General   Post-op Pain Management:    Induction: Intravenous  PONV Risk Score and Plan: Propofol  infusion and TIVA  Airway Management Planned: Natural Airway and Nasal Cannula  Additional Equipment:   Intra-op Plan:   Post-operative Plan:   Informed Consent: I have reviewed the patients History and Physical, chart, labs and discussed the procedure including the risks, benefits and alternatives for the proposed anesthesia with the patient or authorized representative who has indicated his/her understanding and acceptance.     Dental Advisory Given  Plan  Discussed with: CRNA  Anesthesia Plan Comments:         Anesthesia Quick Evaluation  "

## 2024-08-07 NOTE — H&P (Signed)
 "  Rogelia Copping, MD [facility] [address], [address] [place], [place] [address] [phone] Fax : [phone]  Primary Care Physician:  Gasper Nancyann BRAVO, MD Primary Gastroenterologist:  Dr. Copping  Pre-Procedure History & Physical: HPI:  Deanna Weaver is a 79 y.o. female is here for an colonoscopy.   Past Medical History:  Diagnosis Date   Allergic rhinitis    Allergy    Arthritis    lower back   GERD (gastroesophageal reflux disease)    Hyperlipidemia    Hypertension    Metatarsalgia of right foot 10/07/2020    Past Surgical History:  Procedure Laterality Date   ABDOMINAL HYSTERECTOMY     CATARACT EXTRACTION W/ INTRAOCULAR LENS IMPLANT Right    CATARACT EXTRACTION W/PHACO Right 01/16/2019   Procedure: CATARACT EXTRACTION PHACO AND INTRAOCULAR LENS PLACEMENT (IOC) RIGHT;  Surgeon: Myrna Adine Anes, MD;  Location: [facility];  Service: Ophthalmology;  Laterality: Right;   CATARACT EXTRACTION W/PHACO Left 03/27/2019   Procedure: CATARACT EXTRACTION PHACO AND INTRAOCULAR LENS PLACEMENT (IOC) LEFT;  Surgeon: Myrna Adine Anes, MD;  Location: [facility];  Service: Ophthalmology;  Laterality: Left;   COLONOSCOPY     COLONOSCOPY WITH PROPOFOL  N/A 04/24/2019   Procedure: COLONOSCOPY WITH PROPOFOL ;  Surgeon: Copping Rogelia, MD;  Location: [facility];  Service: Endoscopy;  Laterality: N/A;   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   Procedure: 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.;  Surgeon: Edie Norleen PARAS, MD;  Location: [facility];  Service: Orthopedics;  Laterality: Right;   FLEXIBLE SIGMOIDOSCOPY N/A 10/09/2019   Procedure: FLEXIBLE SIGMOIDOSCOPY WITH BIOPSY;  Surgeon: Copping Rogelia, MD;  Location: [facility];  Service: Endoscopy;  Laterality: N/A;   FOOT SURGERY     POLYPECTOMY  04/24/2019   Procedure: POLYPECTOMY;  Surgeon: Copping Rogelia, MD;  Location: [facility];  Service: Endoscopy;;   POLYPECTOMY N/A 10/09/2019    Procedure: POLYPECTOMY;  Surgeon: Copping Rogelia, MD;  Location: [facility];  Service: Endoscopy;  Laterality: N/A;   VAGINAL HYSTERECTOMY  1990    Prior to Admission medications  Medication Sig Start Date End Date Taking? Authorizing Provider  amLODipine  (NORVASC ) 5 MG tablet TAKE 1 TABLET (5 MG TOTAL) BY MOUTH DAILY. 01/11/24  Yes Gasper Nancyann BRAVO, MD  calcium carbonate (OS-CAL - DOSED IN MG OF ELEMENTAL CALCIUM) 1250 (500 Ca) MG tablet Take 1 tablet by mouth daily.    [provider]  fluticasone  (FLONASE ) 50 MCG/ACT nasal spray Place 2 sprays into both nostrils daily as needed. 2(two) nasal each nostril once a day as needed 07/21/24   Gasper Nancyann BRAVO, MD  latanoprost (XALATAN) 0.005 % ophthalmic solution Place 1 drop into both eyes at bedtime.    [provider]  montelukast  (SINGULAIR ) 10 MG tablet TAKE 1 TABLET (10 MG TOTAL) BY MOUTH DAILY FOR ALLERGIES 01/09/24   Gasper Nancyann BRAVO, MD  Omega-3 Fatty Acids (FISH OIL) 1000 MG CAPS Take 2 capsules by mouth daily.    [provider]  omeprazole  (PRILOSEC) 40 MG capsule Take 1 capsule (40 mg total) by mouth daily. 07/21/24   Gasper Nancyann BRAVO, MD  oxybutynin  (DITROPAN -XL) 5 MG 24 hr tablet TAKE 1 TABLET BY MOUTH EVERYDAY AT BEDTIME 08/03/24   Gasper Nancyann BRAVO, MD  simvastatin (ZOCOR) 40 MG tablet TAKE 1 TABLET BY MOUTH EVERYDAY AT BEDTIME 08/26/23   Gasper Nancyann BRAVO, MD    Allergies as of 05/31/2024 - Review Complete 05/31/2024  Allergen Reaction Noted  Ancef  [cefazolin ] Anaphylaxis 09/16/2022   Celecoxib Swelling 04/02/2015   Lisinopril  07/22/2023   Metoprolol  04/02/2015   Preservision areds [actical] Nausea Only 04/04/2020   Cefdinir  Rash 06/30/2020   Penicillins Rash 03/20/2019   Penlac  [ciclopirox ] Rash 09/04/2022    Family History  Problem Relation Age of Onset   Cancer Mother    Heart disease Father     Social History   Socioeconomic History   Marital status: Married    Spouse name: Lynwood    Number of children: 4   Years of education: Not on file   Highest education level: 11th grade  Occupational History   Occupation: Retired    Comment: keeps granchildren  Tobacco Use   Smoking status: Never   Smokeless tobacco: Never  Vaping Use   Vaping status: Never Used  Substance and Sexual Activity   Alcohol use: Never   Drug use: No   Sexual activity: Not Currently  Other Topics Concern   Not on file  Social History Narrative   Not on file   Social Drivers of Health   Tobacco Use: Low Risk (07/10/2024)   Patient History    Smoking Tobacco Use: Never    Smokeless Tobacco Use: Never    Passive Exposure: Not on file  Financial Resource Strain: Low Risk (05/03/2024)   Overall Financial Resource Strain (CARDIA)    Difficulty of Paying Living Expenses: Not hard at all  Food Insecurity: No Food Insecurity (05/03/2024)   Epic    Worried About Programme Researcher, Broadcasting/film/video in the Last Year: Never true    Ran Out of Food in the Last Year: Never true  Transportation Needs: No Transportation Needs (05/03/2024)   Epic    Lack of Transportation (Medical): No    Lack of Transportation (Non-Medical): No  Physical Activity: Inactive (05/03/2024)   Exercise Vital Sign    Days of Exercise per Week: 0 days    Minutes of Exercise per Session: 0 min  Stress: No Stress Concern Present (05/03/2024)   [employer] of Occupational Health - Occupational Stress Questionnaire    Feeling of Stress: Not at all  Social Connections: Moderately Integrated (05/03/2024)   Social Connection and Isolation Panel    Frequency of Communication with Friends and Family: More than three times a week    Frequency of Social Gatherings with Friends and Family: More than three times a week    Attends Religious Services: More than 4 times per year    Active Member of Clubs or Organizations: No    Attends Banker Meetings: Never    Marital Status: Married  Catering Manager Violence: Not At Risk  (05/03/2024)   Epic    Fear of Current or Ex-Partner: No    Emotionally Abused: No    Physically Abused: No    Sexually Abused: No  Depression (PHQ2-9): Low Risk (07/10/2024)   Depression (PHQ2-9)    PHQ-2 Score: 0  Alcohol Screen: Low Risk (05/03/2024)   Alcohol Screen    Last Alcohol Screening Score (AUDIT): 0  Housing: Low Risk (05/03/2024)   Epic    Unable to Pay for Housing in the Last Year: No    Number of Times Moved in the Last Year: 0    Homeless in the Last Year: No  Utilities: Not At Risk (05/03/2024)   Epic    Threatened with loss of utilities: No  Health Literacy: Adequate Health Literacy (05/03/2024)   B1300 Health Literacy    Frequency  of need for help with medical instructions: Never    Review of Systems: See HPI, otherwise negative ROS  Physical Exam: BP (!) 160/74   Pulse 68   Temp (!) 96.9 F (36.1 C) (Temporal)   Resp 18   Ht 5' 1 (1.549 m)   Wt 63.5 kg   SpO2 99%   BMI 26.45 kg/m  General:   Alert,  pleasant and cooperative in NAD Head:  Normocephalic and atraumatic. Neck:  Supple; no masses or thyromegaly. Lungs:  Clear throughout to auscultation.    Heart:  Regular rate and rhythm. Abdomen:  Soft, nontender and nondistended. Normal bowel sounds, without guarding, and without rebound.   Neurologic:  Alert and  oriented x4;  grossly normal neurologically.  Impression/Plan: Deanna Weaver is here for an colonoscopy to be performed for a history of adenomatous polyps on 2021   Risks, benefits, limitations, and alternatives regarding  colonoscopy have been reviewed with the patient.  Questions have been answered.  All parties agreeable.   Rogelia Copping, MD  08/07/2024, 7:40 AM "

## 2024-08-07 NOTE — Op Note (Signed)
 [facility] Gastroenterology Patient Name: Deanna Weaver Procedure Date: 08/07/2024 7:33 AM MRN: [id number] Account #: [id number] Date of Birth: 18-Jan-1946 Admit Type: Outpatient Age: 79 Room: [facility] ENDO ROOM 4 Gender: Female Note Status: Finalized Instrument Name: Colon Scope [id number] Procedure:             Colonoscopy Indications:           High risk colon cancer surveillance: Personal history                         of colonic polyps Providers:             Rogelia Copping MD, MD Referring MD:          Nancyann BRAVO. Gasper, MD (Referring MD) Medicines:             Propofol  per Anesthesia Complications:         No immediate complications. Procedure:             Pre-Anesthesia Assessment:                        - Prior to the procedure, a History and Physical was                         performed, and patient medications and allergies were                         reviewed. The patient's tolerance of previous                         anesthesia was also reviewed. The risks and benefits                         of the procedure and the sedation options and risks                         were discussed with the patient. All questions were                         answered, and informed consent was obtained. Prior                         Anticoagulants: The patient has taken no anticoagulant                         or antiplatelet agents. ASA Grade Assessment: II - A                         patient with mild systemic disease. After reviewing                         the risks and benefits, the patient was deemed in                         satisfactory condition to undergo the procedure.                        After obtaining informed consent, the colonoscope was  passed under direct vision. Throughout the procedure,                         the patient's blood pressure, pulse, and oxygen                         saturations were monitored continuously. The                          Colonoscope was introduced through the anus and                         advanced to the the cecum, identified by appendiceal                         orifice and ileocecal valve. The colonoscopy was                         performed without difficulty. The patient tolerated                         the procedure well. The quality of the bowel                         preparation was excellent. Findings:      The perianal and digital rectal examinations were normal.      Two sessile polyps were found in the transverse colon. The polyps were 3       to 4 mm in size. These polyps were removed with a cold snare. Resection       and retrieval were complete.      A 4 mm polyp was found in the rectum. The polyp was sessile. The polyp       was removed with a cold snare. Resection and retrieval were complete.      A 10 mm polypoid lesion was found in the rectum. The lesion was sessile.       No bleeding was present. This was biopsied with a cold forceps for       histology.      Multiple small-mouthed diverticula were found in the sigmoid colon and       descending colon. Impression:            - Two 3 to 4 mm polyps in the transverse colon,                         removed with a cold snare. Resected and retrieved.                        - One 4 mm polyp in the rectum, removed with a cold                         snare. Resected and retrieved.                        - Polypoid lesion in the rectum. Biopsied.                        - Diverticulosis in the sigmoid colon and in the  descending colon. Recommendation:        - Discharge patient to home.                        - Resume previous diet.                        - Continue present medications.                        - Await pathology results.                        - If rectal lesion adenomatous then will need                         resection. Procedure Code(s):     --- Professional ---                         [phone], Colonoscopy, flexible; with removal of                         tumor(s), polyp(s), or other lesion(s) by snare                         technique                        45380, 59, Colonoscopy, flexible; with biopsy, single                         or multiple Diagnosis Code(s):     --- Professional ---                        Z86.010, Personal history of colonic polyps                        D12.3, Benign neoplasm of transverse colon (hepatic                         flexure or splenic flexure) CPT copyright 2022 American Medical Association. All rights reserved. The codes documented in this report are preliminary and upon coder review may  be revised to meet current compliance requirements. Rogelia Copping MD, MD 08/07/2024 8:11:51 AM This report has been signed electronically. Number of Addenda: 0 Note Initiated On: 08/07/2024 7:33 AM Scope Withdrawal Time: 0 hours 11 minutes 51 seconds  Total Procedure Duration: 0 hours 15 minutes 19 seconds  Estimated Blood Loss:  Estimated blood loss: none.      [facility]

## 2024-08-07 NOTE — Anesthesia Postprocedure Evaluation (Signed)
"   Anesthesia Post Note  Patient: Deanna Weaver  Procedure(s) Performed: COLONOSCOPY  Patient location during evaluation: PACU Anesthesia Type: General Level of consciousness: awake Pain management: pain level controlled Vital Signs Assessment: post-procedure vital signs reviewed and stable Respiratory status: spontaneous breathing Cardiovascular status: stable Anesthetic complications: no   No notable events documented.   Last Vitals:  Vitals:   08/07/24 0812 08/07/24 0822  BP: (!) 102/52 (!) 110/59  Pulse: 62 81  Resp: 14 17  Temp: (!) 36.1 C   SpO2: 93% 99%    Last Pain:  Vitals:   08/07/24 0822  TempSrc:   PainSc: 0-No pain                 VAN STAVEREN,Ashleynicole Mcclees      "

## 2024-08-07 NOTE — Anesthesia Procedure Notes (Signed)
 Procedure Name: MAC Date/Time: 08/07/2024 7:47 AM  Performed by: Brandy Almarie BROCKS, CRNAPre-anesthesia Checklist: Patient identified, Emergency Drugs available, Suction available and Patient being monitored Oxygen Delivery Method: Nasal cannula

## 2024-08-08 LAB — SURGICAL PATHOLOGY

## 2024-08-09 ENCOUNTER — Ambulatory Visit: Payer: Self-pay | Admitting: Gastroenterology

## 2024-08-16 ENCOUNTER — Other Ambulatory Visit: Payer: Self-pay

## 2024-08-18 ENCOUNTER — Telehealth: Payer: Self-pay | Admitting: Family Medicine

## 2024-08-18 NOTE — Telephone Encounter (Signed)
 The patient called to reschedule her appointment due to having two other appointments on the same day. The appointment was rescheduled to 09/26/24 at 9:00 AM

## 2024-08-18 NOTE — Telephone Encounter (Signed)
 The patients daughter called to inquire about the latest available appointment. She was informed that appointments are currently scheduled from late February to early March. She stated she would work around her schedule. The patient has given permission for staff to speak with her daughter

## 2024-08-21 ENCOUNTER — Ambulatory Visit: Admitting: Family Medicine

## 2024-09-26 ENCOUNTER — Ambulatory Visit: Admitting: Family Medicine

## 2025-05-09 ENCOUNTER — Ambulatory Visit
